# Patient Record
Sex: Female | Born: 1975 | Race: Black or African American | Hispanic: No | Marital: Single | Smoking: Never smoker
Health system: Southern US, Community
[De-identification: ages and names within clinical notes are randomized; demographics above are authoritative.]

## PROBLEM LIST (undated history)

## (undated) DIAGNOSIS — F419 Anxiety disorder, unspecified: Secondary | ICD-10-CM

## (undated) DIAGNOSIS — T7840XA Allergy, unspecified, initial encounter: Secondary | ICD-10-CM

## (undated) DIAGNOSIS — I10 Essential (primary) hypertension: Secondary | ICD-10-CM

## (undated) DIAGNOSIS — E785 Hyperlipidemia, unspecified: Secondary | ICD-10-CM

## (undated) HISTORY — DX: Anxiety disorder, unspecified: F41.9

## (undated) HISTORY — DX: Allergy, unspecified, initial encounter: T78.40XA

## (undated) HISTORY — DX: Essential (primary) hypertension: I10

## (undated) HISTORY — DX: Hyperlipidemia, unspecified: E78.5

---

## 2005-10-07 ENCOUNTER — Ambulatory Visit: Payer: Self-pay | Admitting: Family Medicine

## 2012-07-14 ENCOUNTER — Ambulatory Visit: Payer: Self-pay | Admitting: Family Medicine

## 2012-07-14 LAB — CREATININE, SERUM
EGFR (African American): >60
EGFR (Non-African Amer.): >60

## 2012-08-03 DIAGNOSIS — Z01810 Encounter for preprocedural cardiovascular examination: Secondary | ICD-10-CM | POA: Insufficient documentation

## 2012-10-27 HISTORY — PX: LAPAROSCOPIC GASTRIC SLEEVE RESECTION: SHX5895

## 2013-07-03 ENCOUNTER — Emergency Department: Payer: Self-pay | Admitting: Emergency Medicine

## 2013-07-03 LAB — CBC
HCT: 40.2 % (ref 35.0–47.0)
HGB: 13.9 g/dL (ref 12.0–16.0)
MCH: 25.3 pg — ABNORMAL LOW (ref 26.0–34.0)
MCHC: 34.5 g/dL (ref 32.0–36.0)
MCV: 73 fL — ABNORMAL LOW (ref 80–100)
Platelet: 279 10*3/uL (ref 150–440)
RBC: 5.47 10*6/uL — ABNORMAL HIGH (ref 3.80–5.20)
WBC: 8.6 10*3/uL (ref 3.6–11.0)

## 2013-07-03 LAB — COMPREHENSIVE METABOLIC PANEL
Albumin: 3.1 g/dL — ABNORMAL LOW (ref 3.4–5.0)
Anion Gap: 5 — ABNORMAL LOW (ref 7–16)
Bilirubin,Total: 0.4 mg/dL (ref 0.2–1.0)
Calcium, Total: 9 mg/dL (ref 8.5–10.1)
Glucose: 90 mg/dL (ref 65–99)
Osmolality: 272 (ref 275–301)
Potassium: 3.6 mmol/L (ref 3.5–5.1)
SGOT(AST): 15 U/L (ref 15–37)
SGPT (ALT): 16 U/L (ref 12–78)

## 2013-07-03 LAB — LIPASE, BLOOD: Lipase: 51 U/L — ABNORMAL LOW (ref 73–393)

## 2013-07-04 LAB — URINALYSIS, COMPLETE
Bacteria: NONE SEEN
Glucose,UR: NEGATIVE mg/dL (ref 0–75)
Leukocyte Esterase: NEGATIVE
Nitrite: NEGATIVE
Protein: NEGATIVE
RBC,UR: 11 /HPF (ref 0–5)
Specific Gravity: >1.06 (ref 1.003–1.030)
Squamous Epithelial: 6

## 2013-07-09 ENCOUNTER — Other Ambulatory Visit: Payer: Self-pay | Admitting: Gastroenterology

## 2013-07-09 LAB — HCG, QUANTITATIVE, PREGNANCY: Beta Hcg, Quant.: <1 m[IU]/mL — ABNORMAL LOW

## 2013-07-10 ENCOUNTER — Ambulatory Visit: Payer: Self-pay | Admitting: Gastroenterology

## 2013-08-01 ENCOUNTER — Ambulatory Visit: Payer: Self-pay | Admitting: Gastroenterology

## 2014-11-18 DIAGNOSIS — Z9884 Bariatric surgery status: Secondary | ICD-10-CM | POA: Insufficient documentation

## 2015-01-15 ENCOUNTER — Encounter (INDEPENDENT_AMBULATORY_CARE_PROVIDER_SITE_OTHER): Payer: Self-pay

## 2015-01-15 ENCOUNTER — Encounter: Payer: Self-pay | Admitting: Internal Medicine

## 2015-01-15 ENCOUNTER — Ambulatory Visit (INDEPENDENT_AMBULATORY_CARE_PROVIDER_SITE_OTHER): Payer: 59 | Admitting: Internal Medicine

## 2015-01-15 VITALS — BP 142/96 | HR 58 | Temp 98.3°F | Ht 65.0 in | Wt 276.0 lb

## 2015-01-15 DIAGNOSIS — I10 Essential (primary) hypertension: Secondary | ICD-10-CM | POA: Insufficient documentation

## 2015-01-15 NOTE — Progress Notes (Signed)
HPI  Pt presents to the clinic today to establish care and for management of the conditions listed below. She is transferring care from Dr. Dear.  HTN: She reports she has been out of her medications for the last 2 months. Her BP today is 142/96. She reports it normally runs 427-062 systolics. She denies headache, blurred vision, chest pain, or shortness of breath.  Obesity: She has had the gastric sleeve. She thinks she has gained 7 lbs in the last 3 months. Her BMI today is 45.93. She does walk 3-5 days per week. She can do 3 miles in 50 minutes. She is participating in a [place] in June.  Past Medical History  Diagnosis Date  . Hypertension     Current Outpatient Prescriptions  Medication Sig Dispense Refill  . Calcium-Vitamin D-Vitamin K (CALCIUM + D + K PO) Take 1 capsule by mouth daily.    . cyanocobalamin 2000 MCG tablet Take 2,000 mcg by mouth daily.    . hydrochlorothiazide (HYDRODIURIL) 25 MG tablet Take 25 mg by mouth daily.    . Multiple Vitamin (MULTIVITAMIN) tablet Take 1 tablet by mouth daily.    Marland Kitchen PREVIFEM 0.25-35 MG-MCG tablet Take 1 tablet by mouth daily.     No current facility-administered medications for this visit.    Allergies  Allergen Reactions  . Ranitidine Swelling    Face and lip swelling    Family History  Problem Relation Age of Onset  . Arthritis Maternal Grandmother   . Cancer Maternal Grandmother     breast  . Hypertension Maternal Grandmother     History   Social History  . Marital Status: Single    Spouse Name: N/A  . Number of Children: N/A  . Years of Education: N/A   Occupational History  . Not on file.   Social History Main Topics  . Smoking status: Never Smoker   . Smokeless tobacco: Never Used  . Alcohol Use: No  . Drug Use: No  . Sexual Activity: Not on file   Other Topics Concern  . Not on file   Social History Narrative  . No narrative on file    ROS:  Constitutional: Denies fever, malaise, fatigue, headache or  abrupt weight changes.  HEENT: Denies eye pain, eye redness, ear pain, ringing in the ears, wax buildup, runny nose, nasal congestion, bloody nose, or sore throat. Respiratory: Denies difficulty breathing, shortness of breath, cough or sputum production.   Cardiovascular: Denies chest pain, chest tightness, palpitations or swelling in the hands or feet.  Skin: Denies redness, rashes, lesions or ulcercations.  Neurological: Denies dizziness, difficulty with memory, difficulty with speech or problems with balance and coordination.  Psych: Denies anxiety, depression, SI/HI.  No other specific complaints in a complete review of systems (except as listed in HPI above).  PE:  BP 142/96 mmHg  Pulse 58  Temp(Src) 98.3 F (36.8 C) (Oral)  Ht 5\' 5"  (1.651 m)  Wt 276 lb (125.193 kg)  BMI 45.93 kg/m2  SpO2 98%  LMP 12/04/2014 Wt Readings from Last 3 Encounters:  01/15/15 276 lb (125.193 kg)    General: Appears her stated age, obese in NAD. HEENT: Head: normal shape and size; Eyes: sclera white, no icterus, conjunctiva pink, PERRLA and EOMs intact; Cardiovascular: Normal rate and rhythm. S1,S2 noted.  No murmur, rubs or gallops noted. No JVD or BLE edema. No carotid bruits noted. Pulmonary/Chest: Normal effort and positive vesicular breath sounds. No respiratory distress. No wheezes, rales or ronchi noted.  Neurological: Alert and oriented.   BMET    Component Value Date/Time   NA 137 07/03/2013 2132   K 3.6 07/03/2013 2132   CL 103 07/03/2013 2132   CO2 29 07/03/2013 2132   GLUCOSE 90 07/03/2013 2132   BUN 9 07/03/2013 2132   CREATININE 0.85 07/03/2013 2132   CALCIUM 9.0 07/03/2013 2132   GFRNONAA >60 07/03/2013 2132   GFRAA >60 07/03/2013 2132    Lipid Panel  No results found for: CHOL, TRIG, HDL, CHOLHDL, VLDL, LDLCALC  CBC    Component Value Date/Time   WBC 8.6 07/03/2013 2132   RBC 5.47* 07/03/2013 2132   HGB 13.9 07/03/2013 2132   HCT 40.2 07/03/2013 2132   PLT 279  07/03/2013 2132   MCV 73* 07/03/2013 2132   MCH 25.3* 07/03/2013 2132   MCHC 34.5 07/03/2013 2132   RDW 19.1* 07/03/2013 2132    Hgb A1C No results found for: HGBA1C   Assessment and Plan:

## 2015-01-15 NOTE — Assessment & Plan Note (Signed)
Encouraged her to try to pick up the pace of her walking with a goal of 3 miles in 40 minutes

## 2015-01-15 NOTE — Assessment & Plan Note (Signed)
She is not interested in restarting the medication at this time I will have her come back in 1 month to recheck BP If remains > 140/90, will restart HCTZ

## 2015-01-15 NOTE — Patient Instructions (Signed)

## 2015-01-15 NOTE — Progress Notes (Signed)
Pre visit review using our clinic review tool, if applicable. No additional management support is needed unless otherwise documented below in the visit note. 

## 2015-02-16 ENCOUNTER — Ambulatory Visit (INDEPENDENT_AMBULATORY_CARE_PROVIDER_SITE_OTHER): Payer: 59 | Admitting: Internal Medicine

## 2015-02-16 ENCOUNTER — Encounter: Payer: Self-pay | Admitting: Internal Medicine

## 2015-02-16 VITALS — BP 152/94 | HR 81 | Temp 98.3°F | Wt 280.0 lb

## 2015-02-16 DIAGNOSIS — I10 Essential (primary) hypertension: Secondary | ICD-10-CM | POA: Diagnosis not present

## 2015-02-16 MED ORDER — HYDROCHLOROTHIAZIDE 25 MG PO TABS: 25.0000 mg | ORAL_TABLET | Freq: Every day | ORAL | Status: DC

## 2015-02-16 NOTE — Patient Instructions (Signed)

## 2015-02-16 NOTE — Assessment & Plan Note (Signed)
BP remains elevated Will restart her HCTZ Will check CMET in 1 months Consume a DASH diet  RTC in 1 month to recheck blood pressure

## 2015-02-16 NOTE — Progress Notes (Signed)
Pre visit review using our clinic review tool, if applicable. No additional management support is needed unless otherwise documented below in the visit note. 

## 2015-02-16 NOTE — Progress Notes (Signed)
Subjective:    Patient ID: Margaret Carlson, female    DOB: [birth year], 39 y.o.   MRN: [id number]  HPI  Pt presents to the clinic today for 1 month follow up of HTN. At her last visit, her BP was 142/96. She has been on HCTZ in the past. She was not interested in restarting the HCTZ at that time so we decided to recheck it 1 month. Her BP today is 152/94. She denies headache, blurred vision, chest pain, or shortness of breath.  Review of Systems  Past Medical History  Diagnosis Date  . Hypertension     Current Outpatient Prescriptions  Medication Sig Dispense Refill  . Calcium-Vitamin D-Vitamin K (CALCIUM + D + K PO) Take 1 capsule by mouth daily.    . cyanocobalamin 2000 MCG tablet Take 2,000 mcg by mouth daily.    . hydrochlorothiazide (HYDRODIURIL) 25 MG tablet Take 25 mg by mouth daily.    . Multiple Vitamin (MULTIVITAMIN) tablet Take 1 tablet by mouth daily.    Marland Kitchen PREVIFEM 0.25-35 MG-MCG tablet Take 1 tablet by mouth daily.     No current facility-administered medications for this visit.    Allergies  Allergen Reactions  . Ranitidine Swelling    Face and lip swelling    Family History  Problem Relation Age of Onset  . Arthritis Maternal Grandmother   . Cancer Maternal Grandmother     breast  . Hypertension Maternal Grandmother     History   Social History  . Marital Status: Single    Spouse Name: N/A  . Number of Children: N/A  . Years of Education: N/A   Occupational History  . Not on file.   Social History Main Topics  . Smoking status: Never Smoker   . Smokeless tobacco: Never Used  . Alcohol Use: No  . Drug Use: No  . Sexual Activity: Yes   Other Topics Concern  . Not on file   Social History Narrative     Constitutional: Denies fever, malaise, fatigue, headache or abrupt weight changes.  Respiratory: Denies difficulty breathing, shortness of breath, cough or sputum production.   Cardiovascular: Denies chest pain, chest tightness,  palpitations or swelling in the hands or feet.  Neurological: Denies dizziness, difficulty with memory, difficulty with speech or problems with balance and coordination.   No other specific complaints in a complete review of systems (except as listed in HPI above).     Objective:   Physical Exam   BP 152/94 mmHg  Pulse 81  Temp(Src) 98.3 F (36.8 C) (Oral)  Wt 280 lb (127.007 kg)  SpO2 98% Wt Readings from Last 3 Encounters:  02/16/15 280 lb (127.007 kg)  01/15/15 276 lb (125.193 kg)    General: Appears her stated age, obese in NAD. Cardiovascular: Normal rate and rhythm. S1,S2 noted.  No murmur, rubs or gallops noted. . Pulmonary/Chest: Normal effort and positive vesicular breath sounds. No respiratory distress. No wheezes, rales or ronchi noted.  Neurological: Alert and oriented.   BMET    Component Value Date/Time   NA 137 07/03/2013 2132   K 3.6 07/03/2013 2132   CL 103 07/03/2013 2132   CO2 29 07/03/2013 2132   GLUCOSE 90 07/03/2013 2132   BUN 9 07/03/2013 2132   CREATININE 0.85 07/03/2013 2132   CALCIUM 9.0 07/03/2013 2132   GFRNONAA >60 07/03/2013 2132   GFRAA >60 07/03/2013 2132    Lipid Panel  No results found for: CHOL, TRIG, HDL, CHOLHDL, VLDL,  [place]  CBC    Component Value Date/Time   WBC 8.6 07/03/2013 2132   RBC 5.47* 07/03/2013 2132   HGB 13.9 07/03/2013 2132   HCT 40.2 07/03/2013 2132   PLT 279 07/03/2013 2132   MCV 73* 07/03/2013 2132   MCH 25.3* 07/03/2013 2132   MCHC 34.5 07/03/2013 2132   RDW 19.1* 07/03/2013 2132    Hgb A1C No results found for: HGBA1C      Assessment & Plan:

## 2015-03-11 ENCOUNTER — Other Ambulatory Visit: Payer: Self-pay | Admitting: Internal Medicine

## 2015-03-11 DIAGNOSIS — Z0000 Encounter for general adult medical examination without abnormal findings: Secondary | ICD-10-CM

## 2015-03-17 ENCOUNTER — Other Ambulatory Visit (INDEPENDENT_AMBULATORY_CARE_PROVIDER_SITE_OTHER): Payer: 59

## 2015-03-17 DIAGNOSIS — Z0000 Encounter for general adult medical examination without abnormal findings: Secondary | ICD-10-CM | POA: Diagnosis not present

## 2015-03-19 LAB — CBC WITH DIFFERENTIAL/PLATELET
BASOS: 0 %
Basophils Absolute: 0 10*3/uL (ref 0.0–0.2)
EOS (ABSOLUTE): 0.1 10*3/uL (ref 0.0–0.4)
EOS: 2 %
HEMATOCRIT: 36.7 % (ref 34.0–46.6)
Hemoglobin: 12.5 g/dL (ref 11.1–15.9)
Immature Grans (Abs): 0 10*3/uL (ref 0.0–0.1)
Immature Granulocytes: 0 %
LYMPHS: 51 %
Lymphocytes Absolute: 2.5 10*3/uL (ref 0.7–3.1)
MCH: 25.2 pg — ABNORMAL LOW (ref 26.6–33.0)
MCHC: 34.1 g/dL (ref 31.5–35.7)
MCV: 74 fL — ABNORMAL LOW (ref 79–97)
MONOCYTES: 7 %
MONOS ABS: 0.4 10*3/uL (ref 0.1–0.9)
NEUTROS ABS: 2 10*3/uL (ref 1.4–7.0)
NEUTROS PCT: 40 %
Platelets: 249 10*3/uL (ref 150–379)
RBC: 4.96 x10E6/uL (ref 3.77–5.28)
RDW: 17.1 % — AB (ref 12.3–15.4)
WBC: 4.9 10*3/uL (ref 3.4–10.8)

## 2015-03-19 LAB — COMPREHENSIVE METABOLIC PANEL
ALT: 9 IU/L (ref 0–32)
AST: 14 IU/L (ref 0–40)
Albumin/Globulin Ratio: 1.1 (ref 1.1–2.5)
Albumin: 3.5 g/dL (ref 3.5–5.5)
Alkaline Phosphatase: 50 IU/L (ref 39–117)
BUN/Creatinine Ratio: 13 (ref 8–20)
BUN: 11 mg/dL (ref 6–20)
CALCIUM: 8.3 mg/dL — AB (ref 8.7–10.2)
CO2: 26 mmol/L (ref 18–29)
Chloride: 97 mmol/L (ref 97–108)
Creatinine, Ser: 0.85 mg/dL (ref 0.57–1.00)
GFR calc Af Amer: 100 mL/min/{1.73_m2} (ref >59)
GFR, EST NON AFRICAN AMERICAN: 87 mL/min/{1.73_m2} (ref >59)
Globulin, Total: 3.3 g/dL (ref 1.5–4.5)
Glucose: 85 mg/dL (ref 65–99)
Potassium: 3.3 mmol/L — ABNORMAL LOW (ref 3.5–5.2)
Sodium: 139 mmol/L (ref 134–144)
TOTAL PROTEIN: 6.8 g/dL (ref 6.0–8.5)

## 2015-03-19 LAB — LIPID PANEL
Chol/HDL Ratio: 3.7 ratio units (ref 0.0–4.4)
Cholesterol, Total: 227 mg/dL — ABNORMAL HIGH (ref 100–199)
HDL: 62 mg/dL (ref >39)
LDL Calculated: 153 mg/dL — ABNORMAL HIGH (ref 0–99)
TRIGLYCERIDES: 62 mg/dL (ref 0–149)
VLDL CHOLESTEROL CAL: 12 mg/dL (ref 5–40)

## 2015-03-24 ENCOUNTER — Ambulatory Visit (INDEPENDENT_AMBULATORY_CARE_PROVIDER_SITE_OTHER): Payer: 59 | Admitting: Internal Medicine

## 2015-03-24 ENCOUNTER — Encounter: Payer: Self-pay | Admitting: Internal Medicine

## 2015-03-24 VITALS — BP 138/80 | HR 82 | Temp 98.6°F | Ht 65.0 in | Wt 283.0 lb

## 2015-03-24 DIAGNOSIS — Z0000 Encounter for general adult medical examination without abnormal findings: Secondary | ICD-10-CM

## 2015-03-24 DIAGNOSIS — I10 Essential (primary) hypertension: Secondary | ICD-10-CM | POA: Diagnosis not present

## 2015-03-24 DIAGNOSIS — Z23 Encounter for immunization: Secondary | ICD-10-CM

## 2015-03-24 DIAGNOSIS — E876 Hypokalemia: Secondary | ICD-10-CM

## 2015-03-24 MED ORDER — POTASSIUM CHLORIDE CRYS ER 10 MEQ PO TBCR: 10.0000 meq | EXTENDED_RELEASE_TABLET | Freq: Two times a day (BID) | ORAL | Status: DC

## 2015-03-24 MED ORDER — HYDROCHLOROTHIAZIDE 25 MG PO TABS: 25.0000 mg | ORAL_TABLET | Freq: Every day | ORAL | Status: DC

## 2015-03-24 NOTE — Progress Notes (Signed)
Pre visit review using our clinic review tool, if applicable. No additional management support is needed unless otherwise documented below in the visit note. 

## 2015-03-24 NOTE — Progress Notes (Signed)
Subjective:    Patient ID: Margaret Carlson, female    DOB: 09-Apr-1976, 39 y.o.   MRN: [id number]  HPI  Pt presents to the clinic today for her annual exam. She is also due to follow up HTN. At her last visit, her BP was elevated at 152/94. She was restarted on her HCTZ at that time.   Flu: 05/2014 Tetanus: > 10 years LMP: 03/21/15, She is sexually active Pap Smear: 09/2014- normal, she gets pap every 6 months due to history of abnormal paps M[facility]ogram: 2014 at [facility], has one scheduled for 03/26/15 Dentist: biannually  Diet: She drinks 32 oz per day. She does consume some coffee and sweet tea. Exercise: She does an elliptical 30-45 minutes, 3-4 days per week.   Review of Systems      Past Medical History  Diagnosis Date  . Hypertension     Current Outpatient Prescriptions  Medication Sig Dispense Refill  . Calcium-Vitamin D-Vitamin K (CALCIUM + D + K PO) Take 1 capsule by mouth daily.    . cyanocobalamin 2000 MCG tablet Take 2,000 mcg by mouth daily.    . hydrochlorothiazide (HYDRODIURIL) 25 MG tablet Take 1 tablet (25 mg total) by mouth daily. 30 tablet 1  . Multiple Vitamin (MULTIVITAMIN) tablet Take 1 tablet by mouth daily.    Marland Kitchen PREVIFEM 0.25-35 MG-MCG tablet Take 1 tablet by mouth daily.     No current facility-administered medications for this visit.    Allergies  Allergen Reactions  . Ranitidine Swelling    Face and lip swelling    Family History  Problem Relation Age of Onset  . Arthritis Maternal Grandmother   . Cancer Maternal Grandmother     breast  . Hypertension Maternal Grandmother     History   Social History  . Marital Status: Single    Spouse Name: N/A  . Number of Children: N/A  . Years of Education: N/A   Occupational History  . Not on file.   Social History Main Topics  . Smoking status: Never Smoker   . Smokeless tobacco: Never Used  . Alcohol Use: No  . Drug Use: No  . Sexual Activity: Yes   Other Topics Concern  . Not  on file   Social History Narrative     Constitutional: Denies fever, malaise, fatigue, headache or abrupt weight changes.  HEENT: Denies eye pain, eye redness, ear pain, ringing in the ears, wax buildup, runny nose, nasal congestion, bloody nose, or sore throat. Respiratory: Denies difficulty breathing, shortness of breath, cough or sputum production.   Cardiovascular: Denies chest pain, chest tightness, palpitations or swelling in the hands or feet.  Gastrointestinal: Denies abdominal pain, bloating, constipation, diarrhea or blood in the stool.  GU: Denies urgency, frequency, pain with urination, burning sensation, blood in urine, odor or discharge. Musculoskeletal: Denies decrease in range of motion, difficulty with gait, muscle pain or joint pain and swelling.  Skin: Denies redness, rashes, lesions or ulcercations.  Neurological: Denies dizziness, difficulty with memory, difficulty with speech or problems with balance and coordination.  Psych: Denies anxiety, depression, SI/HI.  No other specific complaints in a complete review of systems (except as listed in HPI above).  Objective:   Physical Exam   BP 138/80 mmHg  Pulse 82  Temp(Src) 98.6 F (37 C) (Oral)  Ht 5\' 5"  (1.651 m)  Wt 283 lb (128.368 kg)  BMI 47.09 kg/m2  SpO2 98%  LMP 03/21/2015 Wt Readings from Last 3 Encounters:  03/24/15 283 lb (128.368 kg)  02/16/15 280 lb (127.007 kg)  01/15/15 276 lb (125.193 kg)    General: Appears her stated age, obese in NAD. Skin: Warm, dry and intact. No rashes, lesions or ulcerations noted. HEENT: Head: normal shape and size; Eyes: sclera white, no icterus, conjunctiva pink, PERRLA and EOMs intact; Ears: Tm's gray and intact, normal light reflex; septum midline; Throat/Mouth: Teeth present, mucosa pink and moist, no exudate, lesions or ulcerations noted.  Neck: Neck supple, trachea midline. No masses, lumps or thyromegaly present.  Cardiovascular: Normal rate and rhythm. S1,S2  noted.  No murmur, rubs or gallops noted. No JVD or BLE edema. No carotid bruits noted. Pulmonary/Chest: Normal effort and positive vesicular breath sounds. No respiratory distress. No wheezes, rales or ronchi noted.  Abdomen: Soft and nontender. Normal bowel sounds, no bruits noted. No distention or masses noted. Liver, spleen and kidneys non palpable. Musculoskeletal: Normal range of motion. Strength 5/5 BUE/BLE. No difficulty with gait.  Neurological: Alert and oriented. Cranial nerves II-XII grossly ntact. Coordination normal.  Psychiatric: Mood and affect normal. Behavior is normal. Judgment and thought content normal.     BMET    Component Value Date/Time   NA 139 03/17/2015 1618   NA 137 07/03/2013 2132   K 3.3* 03/17/2015 1618   K 3.6 07/03/2013 2132   CL 97 03/17/2015 1618   CL 103 07/03/2013 2132   CO2 26 03/17/2015 1618   CO2 29 07/03/2013 2132   GLUCOSE 85 03/17/2015 1618   GLUCOSE 90 07/03/2013 2132   BUN 11 03/17/2015 1618   BUN 9 07/03/2013 2132   CREATININE 0.85 03/17/2015 1618   CREATININE 0.85 07/03/2013 2132   CALCIUM 8.3* 03/17/2015 1618   CALCIUM 9.0 07/03/2013 2132   GFRNONAA 87 03/17/2015 1618   GFRNONAA >60 07/03/2013 2132   GFRAA 100 03/17/2015 1618   GFRAA >60 07/03/2013 2132    Lipid Panel     Component Value Date/Time   CHOL 227* 03/17/2015 1618   TRIG 62 03/17/2015 1618   HDL 62 03/17/2015 1618   CHOLHDL 3.7 03/17/2015 1618   LDLCALC 153* 03/17/2015 1618    CBC    Component Value Date/Time   WBC 4.9 03/17/2015 1618   WBC 8.6 07/03/2013 2132   RBC 4.96 03/17/2015 1618   RBC 5.47* 07/03/2013 2132   HGB 13.9 07/03/2013 2132   HCT 36.7 03/17/2015 1618   HCT 40.2 07/03/2013 2132   PLT 279 07/03/2013 2132   MCV 73* 07/03/2013 2132   MCH 25.2* 03/17/2015 1618   MCH 25.3* 07/03/2013 2132   MCHC 34.1 03/17/2015 1618   MCHC 34.5 07/03/2013 2132   RDW 17.1* 03/17/2015 1618   RDW 19.1* 07/03/2013 2132   LYMPHSABS 2.5 03/17/2015 1618    BASOSABS 0.0 03/17/2015 1618    Hgb A1C No results found for: HGBA1C      Assessment & Plan:   Preventative Health Maintenance:  Flu shot UTD Tdap today She has a pap and mammogram scheduled this week Encouraged her to continue seeing a dentist yearly Encouraged her to work on diet and exercise Cholesterol slightly elevated, encouraged her to work on a low fat, low cholesterol diet

## 2015-03-24 NOTE — Patient Instructions (Signed)
Health Maintenance Adopting a healthy lifestyle and getting preventive care can go a [place]e way to promote health and wellness. Talk with your health care provider about what schedule of regular examinations is right for you. This is a good chance for you to check in with your provider about disease prevention and staying healthy. In between checkups, there are plenty of things you can do on your own. Experts have done a lot of research about which lifestyle changes and preventive measures are most likely to keep you healthy. Ask your health care provider for more information. WEIGHT AND DIET  Eat a healthy diet  Be sure to include plenty of vegetables, fruits, low-fat dairy products, and lean protein.  Do not eat a lot of foods high in solid fats, added sugars, or salt.  Get regular exercise. This is one of the most important things you can do for your health.  Most adults should exercise for at least 150 minutes each week. The exercise should increase your heart rate and make you sweat (moderate-intensity exercise).  Most adults should also do strengthening exercises at least twice a week. This is in addition to the moderate-intensity exercise.  Maintain a healthy weight  Body mass index (BMI) is a measurement that can be used to identify possible weight problems. It estimates body fat based on height and weight. Your health care provider can help determine your BMI and help you achieve or maintain a healthy weight.  For females 25 years of age and older:   A BMI below 18.5 is considered underweight.  A BMI of 18.5 to 24.9 is normal.  A BMI of 25 to 29.9 is considered overweight.  A BMI of 30 and above is considered obese.  Watch levels of cholesterol and blood lipids  You should start having your blood tested for lipids and cholesterol at 39 years of age, then have this test every 5 years.  You may need to have your cholesterol levels checked more often if:  Your lipid or  cholesterol levels are high.  You are older than 39 years of age.  You are at high risk for heart disease.  CANCER SCREENING   Lung Cancer  Lung cancer screening is recommended for adults 97-92 years old who are at high risk for lung cancer because of a history of smoking.  A yearly low-dose CT scan of the lungs is recommended for people who:  Currently smoke.  Have quit within the past 15 years.  Have at least a 30-pack-year history of smoking. A pack year is smoking an average of one pack of cigarettes a day for 1 year.  Yearly screening should continue until it has been 15 years since you quit.  Yearly screening should stop if you develop a health problem that would prevent you from having lung cancer treatment.  Breast Cancer  Practice breast self-awareness. This means understanding how your breasts normally appear and feel.  It also means doing regular breast self-exams. Let your health care provider know about any changes, no matter how small.  If you are in your 20s or 30s, you should have a clinical breast exam (CBE) by a health care provider every 1-3 years as part of a regular health exam.  If you are 76 or older, have a CBE every year. Also consider having a breast X-ray (mammogram) every year.  If you have a family history of breast cancer, talk to your health care provider about genetic screening.  If you are  at high risk for breast cancer, talk to your health care provider about having an MRI and a mammogram every year.  Breast cancer gene (BRCA) assessment is recommended for women who have family members with BRCA-related cancers. BRCA-related cancers include:  Breast.  Ovarian.  Tubal.  Peritoneal cancers.  Results of the assessment will determine the need for genetic counseling and BRCA1 and BRCA2 testing. Cervical Cancer Routine pelvic examinations to screen for cervical cancer are no longer recommended for nonpregnant women who are considered low  risk for cancer of the pelvic organs (ovaries, uterus, and vagina) and who do not have symptoms. A pelvic examination may be necessary if you have symptoms including those associated with pelvic infections. Ask your health care provider if a screening pelvic exam is right for you.   The Pap test is the screening test for cervical cancer for women who are considered at risk.  If you had a hysterectomy for a problem that was not cancer or a condition that could lead to cancer, then you no longer need Pap tests.  If you are older than 65 years, and you have had normal Pap tests for the past 10 years, you no longer need to have Pap tests.  If you have had past treatment for cervical cancer or a condition that could lead to cancer, you need Pap tests and screening for cancer for at least 20 years after your treatment.  If you no longer get a Pap test, assess your risk factors if they change (such as having a new sexual partner). This can affect whether you should start being screened again.  Some women have medical problems that increase their chance of getting cervical cancer. If this is the case for you, your health care provider may recommend more frequent screening and Pap tests.  The human papillomavirus (HPV) test is another test that may be used for cervical cancer screening. The HPV test looks for the virus that can cause cell changes in the cervix. The cells collected during the Pap test can be tested for HPV.  The HPV test can be used to screen women 30 years of age and older. Getting tested for HPV can extend the interval between normal Pap tests from three to five years.  An HPV test also should be used to screen women of any age who have unclear Pap test results.  After 39 years of age, women should have HPV testing as often as Pap tests.  Colorectal Cancer  This type of cancer can be detected and often prevented.  Routine colorectal cancer screening usually begins at 39 years of  age and continues through 39 years of age.  Your health care provider may recommend screening at an earlier age if you have risk factors for colon cancer.  Your health care provider may also recommend using home test kits to check for hidden blood in the stool.  A small camera at the end of a tube can be used to examine your colon directly (sigmoidoscopy or colonoscopy). This is done to check for the earliest forms of colorectal cancer.  Routine screening usually begins at age 50.  Direct examination of the colon should be repeated every 5-10 years through 39 years of age. However, you may need to be screened more often if early forms of precancerous polyps or small growths are found. Skin Cancer  Check your skin from head to toe regularly.  Tell your health care provider about any new moles or changes in   moles, especially if there is a change in a mole's shape or color.  Also tell your health care provider if you have a mole that is larger than the size of a pencil eraser.  Always use sunscreen. Apply sunscreen liberally and repeatedly throughout the day.  Protect yourself by wearing [place] sleeves, pants, a wide-brimmed hat, and sunglasses whenever you are outside. HEART DISEASE, DIABETES, AND HIGH BLOOD PRESSURE   Have your blood pressure checked at least every 1-2 years. High blood pressure causes heart disease and increases the risk of stroke.  If you are between 75 years and 42 years old, ask your health care provider if you should take aspirin to prevent strokes.  Have regular diabetes screenings. This involves taking a blood sample to check your fasting blood sugar level.  If you are at a normal weight and have a low risk for diabetes, have this test once every three years after 39 years of age.  If you are overweight and have a high risk for diabetes, consider being tested at a younger age or more often. PREVENTING INFECTION  Hepatitis B  If you have a higher risk for  hepatitis B, you should be screened for this virus. You are considered at high risk for hepatitis B if:  You were born in a country where hepatitis B is common. Ask your health care provider which countries are considered high risk.  Your parents were born in a high-risk country, and you have not been immunized against hepatitis B (hepatitis B vaccine).  You have HIV or AIDS.  You use needles to inject street drugs.  You live with someone who has hepatitis B.  You have had sex with someone who has hepatitis B.  You get hemodialysis treatment.  You take certain medicines for conditions, including cancer, organ transplantation, and autoimmune conditions. Hepatitis C  Blood testing is recommended for:  Everyone born from [birth year] through 1965.  Anyone with known risk factors for hepatitis C. Sexually transmitted infections (STIs)  You should be screened for sexually transmitted infections (STIs) including gonorrhea and chlamydia if:  You are sexually active and are younger than 39 years of age.  You are older than 39 years of age and your health care provider tells you that you are at risk for this type of infection.  Your sexual activity has changed since you were last screened and you are at an increased risk for chlamydia or gonorrhea. Ask your health care provider if you are at risk.  If you do not have HIV, but are at risk, it may be recommended that you take a prescription medicine daily to prevent HIV infection. This is called pre-exposure prophylaxis (PrEP). You are considered at risk if:  You are sexually active and do not regularly use condoms or know the HIV status of your partner(s).  You take drugs by injection.  You are sexually active with a partner who has HIV. Talk with your health care provider about whether you are at high risk of being infected with HIV. If you choose to begin PrEP, you should first be tested for HIV. You should then be tested every 3 months for  as [place] as you are taking PrEP.  PREGNANCY   If you are premenopausal and you may become pregnant, ask your health care provider about preconception counseling.  If you may become pregnant, take 400 to 800 micrograms (mcg) of folic acid every day.  If you want to prevent pregnancy, talk to your  health care provider about birth control (contraception). OSTEOPOROSIS AND MENOPAUSE   Osteoporosis is a disease in which the bones lose minerals and strength with aging. This can result in serious bone fractures. Your risk for osteoporosis can be identified using a bone density scan.  If you are 43 years of age or older, or if you are at risk for osteoporosis and fractures, ask your health care provider if you should be screened.  Ask your health care provider whether you should take a calcium or vitamin D supplement to lower your risk for osteoporosis.  Menopause may have certain physical symptoms and risks.  Hormone replacement therapy may reduce some of these symptoms and risks. Talk to your health care provider about whether hormone replacement therapy is right for you.  HOME CARE INSTRUCTIONS   Schedule regular health, dental, and eye exams.  Stay current with your immunizations.   Do not use any tobacco products including cigarettes, chewing tobacco, or electronic cigarettes.  If you are pregnant, do not drink alcohol.  If you are breastfeeding, limit how much and how often you drink alcohol.  Limit alcohol intake to no more than 1 drink per day for nonpregnant women. One drink equals 12 ounces of beer, 5 ounces of wine, or 1 ounces of hard liquor.  Do not use street drugs.  Do not share needles.  Ask your health care provider for help if you need support or information about quitting drugs.  Tell your health care provider if you often feel depressed.  Tell your health care provider if you have ever been abused or do not feel safe at home. Document Released: 02/28/2011  Document Revised: 12/30/2013 Document Reviewed: 07/17/2013 [facility] Patient Information 2015 [place], [place]. This information is not intended to replace advice given to you by your health care provider. Make sure you discuss any questions you have with your health care provider.

## 2015-03-24 NOTE — Assessment & Plan Note (Addendum)
BP today 138/80, much better control on HCTZ Potassium is low, will start potassium supplement, eRx sent to pharmacy ECG today normal  RTC in 1 month (lab only) to recheck potassium

## 2015-03-26 NOTE — Addendum Note (Signed)
Addended by: Lindalou Hose Y on: 03/26/2015 10:30 AM   Modules accepted: Orders

## 2015-04-06 ENCOUNTER — Other Ambulatory Visit: Payer: Self-pay | Admitting: Unknown Physician Specialty

## 2015-04-06 DIAGNOSIS — R928 Other abnormal and inconclusive findings on diagnostic imaging of breast: Secondary | ICD-10-CM

## 2015-04-08 ENCOUNTER — Ambulatory Visit
Admission: RE | Admit: 2015-04-08 | Discharge: 2015-04-08 | Disposition: A | Discharge status: Home / Self Care | Payer: 59 | Source: Ambulatory Visit | Attending: Unknown Physician Specialty | Admitting: Unknown Physician Specialty

## 2015-04-08 DIAGNOSIS — R928 Other abnormal and inconclusive findings on diagnostic imaging of breast: Secondary | ICD-10-CM | POA: Insufficient documentation

## 2015-05-12 ENCOUNTER — Other Ambulatory Visit: Payer: Self-pay | Admitting: Internal Medicine

## 2015-05-12 NOTE — Telephone Encounter (Signed)
She was supposed to come in to recheck her potassium level and she never did. Have her make lab appt to recheck potassium. Will she run out of med before we can get the lab checked?

## 2015-05-12 NOTE — Telephone Encounter (Signed)
Received an electronic refill request for Potassium Chloride. Last refilled 03/24/15 for #180 with 0 refills. Ok to refill?

## 2015-05-13 NOTE — Telephone Encounter (Signed)
Left message on voicemail.

## 2015-05-15 ENCOUNTER — Other Ambulatory Visit: Payer: 59

## 2015-05-21 NOTE — Telephone Encounter (Signed)
Pt has appt tomorrow will address then.

## 2015-05-21 NOTE — Telephone Encounter (Signed)
Left message on voicemail.

## 2015-05-22 ENCOUNTER — Ambulatory Visit (INDEPENDENT_AMBULATORY_CARE_PROVIDER_SITE_OTHER): Payer: 59 | Admitting: Internal Medicine

## 2015-05-22 ENCOUNTER — Other Ambulatory Visit (INDEPENDENT_AMBULATORY_CARE_PROVIDER_SITE_OTHER): Payer: 59

## 2015-05-22 ENCOUNTER — Encounter: Payer: Self-pay | Admitting: Internal Medicine

## 2015-05-22 VITALS — BP 136/88 | HR 64 | Temp 97.7°F | Wt 282.2 lb

## 2015-05-22 DIAGNOSIS — Z0289 Encounter for other administrative examinations: Secondary | ICD-10-CM

## 2015-05-22 DIAGNOSIS — T502X5A Adverse effect of carbonic-anhydrase inhibitors, benzothiadiazides and other diuretics, initial encounter: Secondary | ICD-10-CM | POA: Diagnosis not present

## 2015-05-22 DIAGNOSIS — E876 Hypokalemia: Secondary | ICD-10-CM | POA: Diagnosis not present

## 2015-05-22 DIAGNOSIS — I10 Essential (primary) hypertension: Secondary | ICD-10-CM | POA: Diagnosis not present

## 2015-05-22 NOTE — Progress Notes (Signed)
Subjective:    Patient ID: Margaret Carlson, female    DOB: August 31, 1975, 39 y.o.   MRN: [id number]  HPI  Pt presents to the clinic today to have a form filled out. It is an insurance form that request exception to their BMI goal. She reports she is unable to achieve the BMI goal of< 30, but she has a plan to start achieving weight loss. She plans to avoid eating out, consuming less carbs. She plans to drink mostly water instead of juices and soda. She is going to join a gym a start with light cardio at least 3 days per week. Her goal is to lose 10 lbs over the next month.  She is also due to have her potassium level checked. At her last visit, her potassium level was 3.3. This is diuretic induced secondary to being on HCTZ. She was started on a potassium supplement but never came back to have her potassium level checked. She would like to get the lab done today. She denies muscle cramps. Her BP today is 136/98.  Review of Systems      Past Medical History  Diagnosis Date  . Hypertension     Current Outpatient Prescriptions  Medication Sig Dispense Refill  . acyclovir (ZOVIRAX) 400 MG tablet Take 400 mg by mouth daily as needed.    . Calcium-Vitamin D-Vitamin K (CALCIUM + D + K PO) Take 1 capsule by mouth daily.    . cyanocobalamin 2000 MCG tablet Take 2,000 mcg by mouth daily.    . hydrochlorothiazide (HYDRODIURIL) 25 MG tablet Take 1 tablet (25 mg total) by mouth daily. 90 tablet 1  . Multiple Vitamin (MULTIVITAMIN) tablet Take 1 tablet by mouth daily.    . potassium chloride (K-DUR,KLOR-CON) 10 MEQ tablet Take 1 tablet (10 mEq total) by mouth 2 (two) times daily. 180 tablet 0  . PREVIFEM 0.25-35 MG-MCG tablet Take 1 tablet by mouth daily.     No current facility-administered medications for this visit.    Allergies  Allergen Reactions  . Ranitidine Swelling    Face and lip swelling    Family History  Problem Relation Age of Onset  . Arthritis Maternal Grandmother   . Cancer  Maternal Grandmother     breast  . Hypertension Maternal Grandmother     Social History   Social History  . Marital Status: Single    Spouse Name: N/A  . Number of Children: N/A  . Years of Education: N/A   Occupational History  . Not on file.   Social History Main Topics  . Smoking status: Never Smoker   . Smokeless tobacco: Never Used  . Alcohol Use: No  . Drug Use: No  . Sexual Activity: Yes   Other Topics Concern  . Not on file   Social History Narrative     Constitutional: Denies fever, malaise, fatigue, headache or abrupt weight changes.  Respiratory: Denies difficulty breathing, shortness of breath, cough or sputum production.   Cardiovascular: Denies chest pain, chest tightness, palpitations or swelling in the hands or feet.  Musculoskeletal: Denies decrease in range of motion, difficulty with gait, muscle pain or joint pain and swelling.  Skin: Denies redness, rashes, lesions or ulcercations.  Neurological: Denies dizziness, difficulty with memory, difficulty with speech or problems with balance and coordination.    No other specific complaints in a complete review of systems (except as listed in HPI above).  Objective:   Physical Exam  BP 136/88 mmHg  Pulse  64  Temp(Src) 97.7 F (36.5 C) (Oral)  Wt 282 lb 4 oz (128.028 kg)  SpO2 98%  LMP 05/18/2015 Wt Readings from Last 3 Encounters:  05/22/15 282 lb 4 oz (128.028 kg)  03/24/15 283 lb (128.368 kg)  02/16/15 280 lb (127.007 kg)    General: Appears her stated age, obese in NAD. Cardiovascular: Normal rate and rhythm. S1,S2 noted.  No murmur, rubs or gallops noted. No BLE edema. Pulmonary/Chest: Normal effort and positive vesicular breath sounds. No respiratory distress. No wheezes, rales or ronchi noted.  Neurological: Alert and oriented.    BMET    Component Value Date/Time   NA 139 03/17/2015 1618   NA 137 07/03/2013 2132   K 3.3* 03/17/2015 1618   K 3.6 07/03/2013 2132   CL 97 03/17/2015  1618   CL 103 07/03/2013 2132   CO2 26 03/17/2015 1618   CO2 29 07/03/2013 2132   GLUCOSE 85 03/17/2015 1618   GLUCOSE 90 07/03/2013 2132   BUN 11 03/17/2015 1618   BUN 9 07/03/2013 2132   CREATININE 0.85 03/17/2015 1618   CREATININE 0.85 07/03/2013 2132   CALCIUM 8.3* 03/17/2015 1618   CALCIUM 9.0 07/03/2013 2132   GFRNONAA 87 03/17/2015 1618   GFRNONAA >60 07/03/2013 2132   GFRAA 100 03/17/2015 1618   GFRAA >60 07/03/2013 2132    Lipid Panel     Component Value Date/Time   CHOL 227* 03/17/2015 1618   TRIG 62 03/17/2015 1618   HDL 62 03/17/2015 1618   CHOLHDL 3.7 03/17/2015 1618   LDLCALC 153* 03/17/2015 1618    CBC    Component Value Date/Time   WBC 4.9 03/17/2015 1618   WBC 8.6 07/03/2013 2132   RBC 4.96 03/17/2015 1618   RBC 5.47* 07/03/2013 2132   HGB 13.9 07/03/2013 2132   HCT 36.7 03/17/2015 1618   HCT 40.2 07/03/2013 2132   PLT 279 07/03/2013 2132   MCV 73* 07/03/2013 2132   MCH 25.2* 03/17/2015 1618   MCH 25.3* 07/03/2013 2132   MCHC 34.1 03/17/2015 1618   MCHC 34.5 07/03/2013 2132   RDW 17.1* 03/17/2015 1618   RDW 19.1* 07/03/2013 2132   LYMPHSABS 2.5 03/17/2015 1618   BASOSABS 0.0 03/17/2015 1618    Hgb A1C No results found for: HGBA1C       Assessment & Plan:   HTN:  Well controlled on HCTZ ECG from 02/2015 reviewed CMET from 02/2015 reviewed No medication changes needed  Diuretic induced hypokalemia:  Will recheck potassium level today Will adjust potassium dose if needed based on labs  Encounter for form completion with pt:  Form filled out per request  Obesity:  Advised her to start a 1500 calorie/day diet Get at least 6- 8oz glasses of water daily Start walking for at least 30 minutes 3 x a week  RTC in 6 months for your annual exam/followup

## 2015-05-22 NOTE — Progress Notes (Signed)
Pre visit review using our clinic review tool, if applicable. No additional management support is needed unless otherwise documented below in the visit note. 

## 2015-05-22 NOTE — Patient Instructions (Signed)

## 2015-05-23 LAB — POTASSIUM: POTASSIUM: 3.8 mmol/L (ref 3.5–5.2)

## 2015-05-25 ENCOUNTER — Encounter: Payer: Self-pay | Admitting: *Deleted

## 2015-05-26 ENCOUNTER — Ambulatory Visit
Admission: RE | Admit: 2015-05-26 | Discharge: 2015-05-26 | Disposition: A | Discharge status: Home / Self Care | Payer: 59 | Source: Ambulatory Visit | Attending: Internal Medicine | Admitting: Internal Medicine

## 2015-05-26 ENCOUNTER — Ambulatory Visit (INDEPENDENT_AMBULATORY_CARE_PROVIDER_SITE_OTHER): Payer: 59 | Admitting: Internal Medicine

## 2015-05-26 ENCOUNTER — Encounter (INDEPENDENT_AMBULATORY_CARE_PROVIDER_SITE_OTHER): Payer: Self-pay

## 2015-05-26 ENCOUNTER — Encounter: Payer: Self-pay | Admitting: Internal Medicine

## 2015-05-26 VITALS — BP 138/92 | HR 67 | Temp 98.2°F | Wt 288.5 lb

## 2015-05-26 DIAGNOSIS — K5900 Constipation, unspecified: Secondary | ICD-10-CM | POA: Diagnosis not present

## 2015-05-26 DIAGNOSIS — R3129 Other microscopic hematuria: Secondary | ICD-10-CM

## 2015-05-26 DIAGNOSIS — M545 Low back pain, unspecified: Secondary | ICD-10-CM

## 2015-05-26 DIAGNOSIS — R312 Other microscopic hematuria: Secondary | ICD-10-CM | POA: Diagnosis not present

## 2015-05-26 LAB — POCT URINALYSIS DIPSTICK
Bilirubin, UA: NEGATIVE
GLUCOSE UA: NEGATIVE
KETONES UA: NEGATIVE
Leukocytes, UA: NEGATIVE
Nitrite, UA: NEGATIVE
UROBILINOGEN UA: NEGATIVE
pH, UA: 5.5

## 2015-05-26 NOTE — Addendum Note (Signed)
Addended by: Lurlean Nanny on: 05/26/2015 03:04 PM   Modules accepted: Orders

## 2015-05-26 NOTE — Progress Notes (Signed)
Pre visit review using our clinic review tool, if applicable. No additional management support is needed unless otherwise documented below in the visit note. 

## 2015-05-26 NOTE — Progress Notes (Signed)
HPI  Pt presents to the clinic today with c/o low back pain. This started 2 weeks ago. She describes the pain as crampy and aching. The pain does not radiated. It does seem worse with movement. She denies urgency, frequency, nausea, vomiting or change in her bowels. She has tried increasing fluids and taking cranberry juice. She has taken Tylenol with minimal relief. She has no history of kidney stones. She denies any injury to the back that she is aware of.   Review of Systems  Past Medical History  Diagnosis Date  . Hypertension     Family History  Problem Relation Age of Onset  . Arthritis Maternal Grandmother   . Cancer Maternal Grandmother     breast  . Hypertension Maternal Grandmother     Social History   Social History  . Marital Status: Single    Spouse Name: N/A  . Number of Children: N/A  . Years of Education: N/A   Occupational History  . Not on file.   Social History Main Topics  . Smoking status: Never Smoker   . Smokeless tobacco: Never Used  . Alcohol Use: No  . Drug Use: No  . Sexual Activity: Yes   Other Topics Concern  . Not on file   Social History Narrative    Allergies  Allergen Reactions  . Ranitidine Swelling    Face and lip swelling    Constitutional: Denies fever, malaise, fatigue, headache or abrupt weight changes.   GI: Denies constipation, diarrhea, or blood in her stool. GU: Denies urgency, frequency,  burning sensation, blood in urine, odor or discharge. MSK: Pt reports low back pain. Denies joint pain and swelling. Skin: Denies redness, rashes, lesions or ulcercations.   No other specific complaints in a complete review of systems (except as listed in HPI above).    Objective:   Physical Exam  BP 138/92 mmHg  Pulse 67  Temp(Src) 98.2 F (36.8 C) (Oral)  Wt 288 lb 8 oz (130.863 kg)  SpO2 98%  LMP 05/18/2015  Wt Readings from Last 3 Encounters:  05/26/15 288 lb 8 oz (130.863 kg)  05/22/15 282 lb 4 oz (128.028 kg)   03/24/15 283 lb (128.368 kg)    General: Appears her stated age, obese, in NAD. Cardiovascular: Normal rate and rhythm. S1,S2 noted.   Pulmonary/Chest: Normal effort and positive vesicular breath sounds. No respiratory distress. No wheezes, rales or ronchi noted.  Abdomen: Soft and nontender. Normal bowel sounds. No distention or masses noted. No CVA tenderness.  MSK: No pain with palpation over the lumbar spine. Pain with palpation of the paralumbar muscles. Decreased flexion, extension and rotation secondary to pain.     Assessment & Plan:   Low back:  Seems MSK but concerned about amount of blood in her urine Urinalysis: large blood (she is not on or due for her menstrual) CMET and KUB today Push fluids She declines RX for Tramadol, continue Tylenol A heating pad may be helpful To ER if symptoms worsen  RTC as needed or if symptoms persist.

## 2015-05-26 NOTE — Patient Instructions (Signed)
Back Pain, Adult Low back pain is very common. About 1 in 5 people have back pain.The cause of low back pain is rarely dangerous. The pain often gets better over time.About half of people with a sudden onset of back pain feel better in just 2 weeks. About 8 in 10 people feel better by 6 weeks.  CAUSES Some common causes of back pain include:  Strain of the muscles or ligaments supporting the spine.  Wear and tear (degeneration) of the spinal discs.  Arthritis.  Direct injury to the back. DIAGNOSIS Most of the time, the direct cause of low back pain is not known.However, back pain can be treated effectively even when the exact cause of the pain is unknown.Answering your caregiver's questions about your overall health and symptoms is one of the most accurate ways to make sure the cause of your pain is not dangerous. If your caregiver needs more information, he or she may order lab work or imaging tests (X-rays or MRIs).However, even if imaging tests show changes in your back, this usually does not require surgery. HOME CARE INSTRUCTIONS For many people, back pain returns.Since low back pain is rarely dangerous, it is often a condition that people can learn to manageon their own.   Remain active. It is stressful on the back to sit or stand in one place. Do not sit, drive, or stand in one place for more than 30 minutes at a time. Take short walks on level surfaces as soon as pain allows.Try to increase the length of time you walk each day.  Do not stay in bed.Resting more than 1 or 2 days can delay your recovery.  Do not avoid exercise or work.Your body is made to move.It is not dangerous to be active, even though your back may hurt.Your back will likely heal faster if you return to being active before your pain is gone.  Pay attention to your body when you bend and lift. Many people have less discomfortwhen lifting if they bend their knees, keep the load close to their bodies,and  avoid twisting. Often, the most comfortable positions are those that put less stress on your recovering back.  Find a comfortable position to sleep. Use a firm mattress and lie on your side with your knees slightly bent. If you lie on your back, put a pillow under your knees.  Only take over-the-counter or prescription medicines as directed by your caregiver. Over-the-counter medicines to reduce pain and inflammation are often the most helpful.Your caregiver may prescribe muscle relaxant drugs.These medicines help dull your pain so you can more quickly return to your normal activities and healthy exercise.  Put ice on the injured area.  Put ice in a plastic bag.  Place a towel between your skin and the bag.  Leave the ice on for 15-20 minutes, 03-04 times a day for the first 2 to 3 days. After that, ice and heat may be alternated to reduce pain and spasms.  Ask your caregiver about trying back exercises and gentle massage. This may be of some benefit.  Avoid feeling anxious or stressed.Stress increases muscle tension and can worsen back pain.It is important to recognize when you are anxious or stressed and learn ways to manage it.Exercise is a great option. SEEK MEDICAL CARE IF:  You have pain that is not relieved with rest or medicine.  You have pain that does not improve in 1 week.  You have new symptoms.  You are generally not feeling well. SEEK   IMMEDIATE MEDICAL CARE IF:   You have pain that radiates from your back into your legs.  You develop new bowel or bladder control problems.  You have unusual weakness or numbness in your arms or legs.  You develop nausea or vomiting.  You develop abdominal pain.  You feel faint. Document Released: 08/15/2005 Document Revised: 02/14/2012 Document Reviewed: 12/17/2013 ExitCare Patient Information 2015 [employer]. This information is not intended to replace advice given to you by your health care provider. Make sure you  discuss any questions you have with your health care provider.  

## 2015-05-26 NOTE — Addendum Note (Signed)
Addended by: Marchia Bond on: 05/26/2015 01:39 PM   Modules accepted: Orders

## 2015-05-27 ENCOUNTER — Other Ambulatory Visit: Payer: Self-pay | Admitting: Internal Medicine

## 2015-05-27 LAB — COMPREHENSIVE METABOLIC PANEL
ALBUMIN: 3.4 g/dL — AB (ref 3.5–5.5)
ALT: 9 IU/L (ref 0–32)
AST: 12 IU/L (ref 0–40)
Albumin/Globulin Ratio: 1 — ABNORMAL LOW (ref 1.1–2.5)
Alkaline Phosphatase: 52 IU/L (ref 39–117)
BILIRUBIN TOTAL: 0.2 mg/dL (ref 0.0–1.2)
BUN/Creatinine Ratio: 19 (ref 8–20)
BUN: 13 mg/dL (ref 6–20)
CALCIUM: 8.4 mg/dL — AB (ref 8.7–10.2)
CHLORIDE: 101 mmol/L (ref 97–108)
CO2: 26 mmol/L (ref 18–29)
CREATININE: 0.69 mg/dL (ref 0.57–1.00)
GFR calc non Af Amer: 110 mL/min/{1.73_m2} (ref >59)
GFR, EST AFRICAN AMERICAN: 127 mL/min/{1.73_m2} (ref >59)
GLUCOSE: 77 mg/dL (ref 65–99)
Globulin, Total: 3.4 g/dL (ref 1.5–4.5)
Potassium: 4 mmol/L (ref 3.5–5.2)
Sodium: 138 mmol/L (ref 134–144)
TOTAL PROTEIN: 6.8 g/dL (ref 6.0–8.5)

## 2015-05-27 MED ORDER — METHOCARBAMOL 500 MG PO TABS
500.0000 mg | ORAL_TABLET | Freq: Three times a day (TID) | ORAL | Status: DC | PRN
Start: 2015-05-27 — End: 2016-06-08

## 2015-09-23 ENCOUNTER — Other Ambulatory Visit: Payer: Self-pay | Admitting: Unknown Physician Specialty

## 2015-09-23 DIAGNOSIS — R928 Other abnormal and inconclusive findings on diagnostic imaging of breast: Secondary | ICD-10-CM

## 2015-10-02 ENCOUNTER — Telehealth: Payer: Self-pay

## 2015-10-02 NOTE — Telephone Encounter (Signed)
Left voicemail regarding flu shot clinic.

## 2015-10-12 ENCOUNTER — Ambulatory Visit
Admission: RE | Admit: 2015-10-12 | Discharge: 2015-10-12 | Disposition: A | Discharge status: Home / Self Care | Payer: 59 | Source: Ambulatory Visit | Attending: Unknown Physician Specialty | Admitting: Unknown Physician Specialty

## 2015-10-12 DIAGNOSIS — R928 Other abnormal and inconclusive findings on diagnostic imaging of breast: Secondary | ICD-10-CM

## 2015-10-23 ENCOUNTER — Encounter: Payer: Self-pay | Admitting: Internal Medicine

## 2015-10-23 ENCOUNTER — Ambulatory Visit (INDEPENDENT_AMBULATORY_CARE_PROVIDER_SITE_OTHER): Payer: 59 | Admitting: Internal Medicine

## 2015-10-23 VITALS — BP 136/84 | HR 68 | Temp 98.7°F | Wt 293.5 lb

## 2015-10-23 DIAGNOSIS — R05 Cough: Secondary | ICD-10-CM

## 2015-10-23 DIAGNOSIS — R52 Pain, unspecified: Secondary | ICD-10-CM | POA: Diagnosis not present

## 2015-10-23 DIAGNOSIS — R059 Cough, unspecified: Secondary | ICD-10-CM

## 2015-10-23 MED ORDER — HYDROCODONE-HOMATROPINE 5-1.5 MG/5ML PO SYRP: 5.0000 mL | ORAL_SOLUTION | Freq: Three times a day (TID) | ORAL | Status: DC | PRN

## 2015-10-23 NOTE — Progress Notes (Signed)
Pre visit review using our clinic review tool, if applicable. No additional management support is needed unless otherwise documented below in the visit note. 

## 2015-10-23 NOTE — Progress Notes (Signed)
HPI  Pt presents to the clinic today with c/o cough and body aches. This started yesterday. The cough is non productive. She denies runny nose, nasal congestion or sore throat. She denies fever. She has tried Copywriter, advertising with minimal relief. She has has no history of allergies or breathing problems. She has had sick contacts. She did get her flu shot.  Review of Systems      Past Medical History  Diagnosis Date  . Hypertension     Family History  Problem Relation Age of Onset  . Arthritis Maternal Grandmother   . Cancer Maternal Grandmother     breast  . Hypertension Maternal Grandmother   . Breast cancer Maternal Grandmother     Social History   Social History  . Marital Status: Single    Spouse Name: N/A  . Number of Children: N/A  . Years of Education: N/A   Occupational History  . Not on file.   Social History Main Topics  . Smoking status: Never Smoker   . Smokeless tobacco: Never Used  . Alcohol Use: No  . Drug Use: No  . Sexual Activity: Yes   Other Topics Concern  . Not on file   Social History Narrative    Allergies  Allergen Reactions  . Ranitidine Swelling    Face and lip swelling     Constitutional: Denies headache, fatigue, fever or abrupt weight changes.  HEENT:  Denies eye redness, eye pain, pressure behind the eyes, facial pain, nasal congestion, ear pain, ringing in the ears, wax buildup, runny nose or sore throat. Respiratory: Positive cough. Denies difficulty breathing or shortness of breath.  Cardiovascular: Denies chest pain, chest tightness, palpitations or swelling in the hands or feet.   No other specific complaints in a complete review of systems (except as listed in HPI above).  Objective:   BP 136/84 mmHg  Pulse 68  Temp(Src) 98.7 F (37.1 C) (Oral)  Wt 293 lb 8 oz (133.131 kg)  SpO2 98%  LMP 10/05/2015 (Within Days)  Wt Readings from Last 3 Encounters:  10/23/15 293 lb 8 oz (133.131 kg)  05/26/15 288 lb 8 oz (130.863  kg)  05/22/15 282 lb 4 oz (128.028 kg)     General: Appears her stated age,in NAD. HEENT: Head: normal shape and size, no sinus tenderness noted; Eyes: sclera white, no icterus, conjunctiva pink; Ears: Tm's gray and intact, normal light reflex;  Throat/Mouth: Teeth present, mucosa pink and moist, + PND, no exudate noted, no lesions or ulcerations noted.  Neck: No cervical lymphadenopathy.  Cardiovascular: Normal rate and rhythm. S1,S2 noted.  No murmur, rubs or gallops noted.  Pulmonary/Chest: Normal effort and diminished vesicular breath sounds. No respiratory distress. No wheezes, rales or ronchi noted.      Assessment & Plan:   Cough and body aches:  Rapid flu: negative Get some rest and drink plenty of water Mucinex 600 mg BID Rx for Hycodan cough syrup  RTC as needed or if symptoms persist.

## 2015-10-23 NOTE — Patient Instructions (Signed)

## 2015-11-08 ENCOUNTER — Other Ambulatory Visit: Payer: Self-pay | Admitting: Internal Medicine

## 2015-12-07 ENCOUNTER — Ambulatory Visit: Payer: 59 | Admitting: Internal Medicine

## 2016-04-08 ENCOUNTER — Ambulatory Visit (INDEPENDENT_AMBULATORY_CARE_PROVIDER_SITE_OTHER): Payer: 59 | Admitting: Internal Medicine

## 2016-04-08 ENCOUNTER — Encounter: Payer: Self-pay | Admitting: Internal Medicine

## 2016-04-08 VITALS — BP 138/96 | HR 94 | Temp 99.5°F | Wt 292.0 lb

## 2016-04-08 DIAGNOSIS — J069 Acute upper respiratory infection, unspecified: Secondary | ICD-10-CM

## 2016-04-08 MED ORDER — HYDROCODONE-HOMATROPINE 5-1.5 MG/5ML PO SYRP: 5.0000 mL | ORAL_SOLUTION | Freq: Three times a day (TID) | ORAL | 0 refills | Status: DC | PRN

## 2016-04-08 NOTE — Patient Instructions (Signed)
Upper Respiratory Infection, Adult Most upper respiratory infections (URIs) are a viral infection of the air passages leading to the lungs. A URI affects the nose, throat, and upper air passages. The most common type of URI is nasopharyngitis and is typically referred to as "the common cold." URIs run their course and usually go away on their own. Most of the time, a URI does not require medical attention, but sometimes a bacterial infection in the upper airways can follow a viral infection. This is called a secondary infection. Sinus and middle ear infections are common types of secondary upper respiratory infections. Bacterial pneumonia can also complicate a URI. A URI can worsen asthma and chronic obstructive pulmonary disease (COPD). Sometimes, these complications can require emergency medical care and may be life threatening.  CAUSES Almost all URIs are caused by viruses. A virus is a type of germ and can spread from one person to another.  RISKS FACTORS You may be at risk for a URI if:   You smoke.   You have chronic heart or lung disease.  You have a weakened defense (immune) system.   You are very young or very old.   You have nasal allergies or asthma.  You work in crowded or poorly ventilated areas.  You work in health care facilities or schools. SIGNS AND SYMPTOMS  Symptoms typically develop 2-3 days after you come in contact with a cold virus. Most viral URIs last 7-10 days. However, viral URIs from the influenza virus (flu virus) can last 14-18 days and are typically more severe. Symptoms may include:   Runny or stuffy (congested) nose.   Sneezing.   Cough.   Sore throat.   Headache.   Fatigue.   Fever.   Loss of appetite.   Pain in your forehead, behind your eyes, and over your cheekbones (sinus pain).  Muscle aches.  DIAGNOSIS  Your health care provider may diagnose a URI by:  Physical exam.  Tests to check that your symptoms are not due to  another condition such as:  Strep throat.  Sinusitis.  Pneumonia.  Asthma. TREATMENT  A URI goes away on its own with time. It cannot be cured with medicines, but medicines may be prescribed or recommended to relieve symptoms. Medicines may help:  Reduce your fever.  Reduce your cough.  Relieve nasal congestion. HOME CARE INSTRUCTIONS   Take medicines only as directed by your health care provider.   Gargle warm saltwater or take cough drops to comfort your throat as directed by your health care provider.  Use a warm mist humidifier or inhale steam from a shower to increase air moisture. This may make it easier to breathe.  Drink enough fluid to keep your urine clear or pale yellow.   Eat soups and other clear broths and maintain good nutrition.   Rest as needed.   Return to work when your temperature has returned to normal or as your health care provider advises. You may need to stay home longer to avoid infecting others. You can also use a face mask and careful hand washing to prevent spread of the virus.  Increase the usage of your inhaler if you have asthma.   Do not use any tobacco products, including cigarettes, chewing tobacco, or electronic cigarettes. If you need help quitting, ask your health care provider. PREVENTION  The best way to protect yourself from getting a cold is to practice good hygiene.   Avoid oral or hand contact with people with cold   symptoms.   Wash your hands often if contact occurs.  There is no clear evidence that vitamin C, vitamin E, echinacea, or exercise reduces the chance of developing a cold. However, it is always recommended to get plenty of rest, exercise, and practice good nutrition.  SEEK MEDICAL CARE IF:   You are getting worse rather than better.   Your symptoms are not controlled by medicine.   You have chills.  You have worsening shortness of breath.  You have brown or red mucus.  You have yellow or brown nasal  discharge.  You have pain in your face, especially when you bend forward.  You have a fever.  You have swollen neck glands.  You have pain while swallowing.  You have white areas in the back of your throat. SEEK IMMEDIATE MEDICAL CARE IF:   You have severe or persistent:  Headache.  Ear pain.  Sinus pain.  Chest pain.  You have chronic lung disease and any of the following:  Wheezing.  Prolonged cough.  Coughing up blood.  A change in your usual mucus.  You have a stiff neck.  You have changes in your:  Vision.  Hearing.  Thinking.  Mood. MAKE SURE YOU:   Understand these instructions.  Will watch your condition.  Will get help right away if you are not doing well or get worse.   This information is not intended to replace advice given to you by your health care provider. Make sure you discuss any questions you have with your health care provider.   Document Released: 02/08/2001 Document Revised: 12/30/2014 Document Reviewed: 11/20/2013 Elsevier Interactive Patient Education 2016 [employer]  

## 2016-04-08 NOTE — Progress Notes (Signed)
Subjective:    Patient ID: Margaret Carlson, female    DOB: 13-May-1976, 40 y.o.   MRN: [id number]  HPI  Pt presents to the clinic today with a complaint of cough x 5 days.  She admits to chest congestion but states it has improved, and reports shortness of breath yesterday when climbing the stairs but has not had any since.  She denies sputum production, hemoptysis, or chest pain.  She admits to sneezing, stuffy nose, clear nasal mucus, drainage down the back of the throat, and watering and redness of the eyes.  She denies runny nose, ear fullness, ear pressure, or ear pain.  She states she had chills last night and felt feverish but did not take her temperature.  She admits to a headache 4 days ago that she describes as sinus pressure across the forehead at a severity of 7/10, but has not had one since then.  She denies associated nausea, vomiting, or changes in vision.  She has tried Claritin and Benadryl with some relief.  She denies history of allergies or exposure to sick contacts.  Review of Systems Past Medical History:  Diagnosis Date  . Hypertension    Past Surgical History:  Procedure Laterality Date  . LAPAROSCOPIC GASTRIC SLEEVE RESECTION  10/2012   Family History  Problem Relation Age of Onset  . Arthritis Maternal Grandmother   . Cancer Maternal Grandmother     breast  . Hypertension Maternal Grandmother   . Breast cancer Maternal Grandmother    Current Outpatient Prescriptions on File Prior to Visit  Medication Sig Dispense Refill  . acyclovir (ZOVIRAX) 400 MG tablet Take 400 mg by mouth daily as needed.    . Calcium-Vitamin D-Vitamin K (CALCIUM + D + K PO) Take 1 capsule by mouth daily.    . cyanocobalamin 2000 MCG tablet Take 2,000 mcg by mouth daily.    . methocarbamol (ROBAXIN) 500 MG tablet Take 1 tablet (500 mg total) by mouth 3 (three) times daily as needed for muscle spasms. 30 tablet 0  . Multiple Vitamin (MULTIVITAMIN) tablet Take 1 tablet by mouth daily.    .  potassium chloride (K-DUR,KLOR-CON) 10 MEQ tablet Take 1 tablet (10 mEq total) by mouth 2 (two) times daily. 180 tablet 0  . PREVIFEM 0.25-35 MG-MCG tablet Take 1 tablet by mouth daily.    . hydrochlorothiazide (HYDRODIURIL) 25 MG tablet Take 1 tablet (25 mg total) by mouth daily. MUST SCHEDULE ANNUAL PHYSICAL FOR August 2017 (Patient not taking: Reported on 04/08/2016) 90 tablet 1   No current facility-administered medications on file prior to visit.    Allergies  Allergen Reactions  . Ranitidine Swelling    Face and lip swelling    Const: Pt reports chills, subjective fever, and headache. HEENT: Pt reports sneezing, nasal congestion, and watery/red eyes. Denies vision changes, runny nose, ear pain, ear pressure or ear fullness. Pulm: Pt reports cough, chest congestion, and shortness of breath.  Denies sputum production or hemoptysis. CV: Denies chest pain. GI: Denies nausea or vomiting.     Objective:   Physical Exam  BP (!) 138/96   Pulse 94   Temp 99.5 F (37.5 C) (Oral)   Wt 292 lb (132.5 kg)   SpO2 97%   BMI 48.59 kg/m   General: Well-appearing, in no acute distress. HEENT: No scleral icterus or conjunctival injection.  TMs pearly grey.  Slight erythema and inflammation of nasal turbinates.  No erythema or exudates of pharynx.  Sinuses non-tender to  palpation. Neck: No lymphadenopathy noted. Pulm: Clear to auscultation bilaterally.  No wheezes, rales, or rhonchi. CV: Regular rate and rhythm.  No murmurs, rubs, or gallops.     Assessment & Plan:   Viral URI:  Continue Claritin Tylenol for fevers  Rx for Hycodan 3ml q8hrs as needed for cough Call if symptoms worsen or do not resolve  BAITY, REGINA, NP

## 2016-06-08 ENCOUNTER — Encounter: Payer: Self-pay | Admitting: Internal Medicine

## 2016-06-08 ENCOUNTER — Ambulatory Visit (INDEPENDENT_AMBULATORY_CARE_PROVIDER_SITE_OTHER): Payer: 59 | Admitting: Internal Medicine

## 2016-06-08 VITALS — BP 148/92 | HR 73 | Temp 98.3°F | Ht 65.0 in | Wt 286.0 lb

## 2016-06-08 DIAGNOSIS — Z113 Encounter for screening for infections with a predominantly sexual mode of transmission: Secondary | ICD-10-CM | POA: Diagnosis not present

## 2016-06-08 DIAGNOSIS — E782 Mixed hyperlipidemia: Secondary | ICD-10-CM | POA: Diagnosis not present

## 2016-06-08 DIAGNOSIS — Z0000 Encounter for general adult medical examination without abnormal findings: Secondary | ICD-10-CM | POA: Diagnosis not present

## 2016-06-08 DIAGNOSIS — I10 Essential (primary) hypertension: Secondary | ICD-10-CM | POA: Diagnosis not present

## 2016-06-08 MED ORDER — AMLODIPINE BESYLATE 5 MG PO TABS: 5.0000 mg | ORAL_TABLET | Freq: Every day | ORAL | 0 refills | Status: DC

## 2016-06-08 NOTE — Assessment & Plan Note (Signed)
Not controlled, secondary to noncompliance Discussed DASH diet and exercising to lose weight eRx for Norvasc 5 mg daily  RTC in 3 weeks for BP recheck

## 2016-06-08 NOTE — Progress Notes (Signed)
Subjective:    Patient ID: Margaret Carlson, female    DOB: 1976-04-05, 40 y.o.   MRN: [id number]  HPI  Pt presents to the clinic today for her annual exam. She is also due to follow up HTN. She is supposed to be taking HCTZ and a potassium supplement. She does not take it because it makes her urinate a lot. Her BP today is 148/92. She denies headache, dizziness, blurred vision, chest pain or shortness of breath.  Flu: 06/08/2016, with her employer Tetanus: 02/2015 Pap Smear: 04/2016- normal, she gets pap every 6 months due to history of abnormal paps Mammogram: 09/2015 at [place] Screening: yearly Dentist: biannually  Diet: She does eat meat. She consumes fruits and veggies most days of the week. She cut out fried food. She drinks mostly water.  Exercise: She does bootcamp 3 x week for 1 hour, she does other aerobic classes 2 x week.   Review of Systems      Past Medical History:  Diagnosis Date  . Hypertension     Current Outpatient Prescriptions  Medication Sig Dispense Refill  . acyclovir (ZOVIRAX) 400 MG tablet Take 400 mg by mouth daily as needed.    . Calcium-Vitamin D-Vitamin K (CALCIUM + D + K PO) Take 1 capsule by mouth daily.    . cyanocobalamin 2000 MCG tablet Take 2,000 mcg by mouth daily.    . hydrochlorothiazide (HYDRODIURIL) 25 MG tablet Take 1 tablet (25 mg total) by mouth daily. MUST SCHEDULE ANNUAL PHYSICAL FOR August 2017 (Patient not taking: Reported on 04/08/2016) 90 tablet 1  . HYDROcodone-homatropine (HYCODAN) 5-1.5 MG/5ML syrup Take 5 mLs by mouth every 8 (eight) hours as needed for cough. 120 mL 0  . methocarbamol (ROBAXIN) 500 MG tablet Take 1 tablet (500 mg total) by mouth 3 (three) times daily as needed for muscle spasms. 30 tablet 0  . Multiple Vitamin (MULTIVITAMIN) tablet Take 1 tablet by mouth daily.    . potassium chloride (K-DUR,KLOR-CON) 10 MEQ tablet Take 1 tablet (10 mEq total) by mouth 2 (two) times daily. 180 tablet 0  . PREVIFEM  0.25-35 MG-MCG tablet Take 1 tablet by mouth daily.     No current facility-administered medications for this visit.     Allergies  Allergen Reactions  . Ranitidine Swelling    Face and lip swelling    Family History  Problem Relation Age of Onset  . Arthritis Maternal Grandmother   . Cancer Maternal Grandmother     breast  . Hypertension Maternal Grandmother   . Breast cancer Maternal Grandmother     Social History   Social History  . Marital status: Single    Spouse name: N/A  . Number of children: N/A  . Years of education: N/A   Occupational History  . Not on file.   Social History Main Topics  . Smoking status: Never Smoker  . Smokeless tobacco: Never Used  . Alcohol use No  . Drug use: No  . Sexual activity: Yes   Other Topics Concern  . Not on file   Social History Narrative  . No narrative on file     Constitutional: Denies fever, malaise, fatigue, headache or abrupt weight changes.  HEENT: Denies eye pain, eye redness, ear pain, ringing in the ears, wax buildup, runny nose, nasal congestion, bloody nose, or sore throat. Respiratory: Denies difficulty breathing, shortness of breath, cough or sputum production.   Cardiovascular: Denies chest pain, chest tightness, palpitations or swelling in  the hands or feet.  Gastrointestinal: Denies abdominal pain, bloating, constipation, diarrhea or blood in the stool.  GU: Denies urgency, frequency, pain with urination, burning sensation, blood in urine, odor or discharge. Musculoskeletal: Denies decrease in range of motion, difficulty with gait, muscle pain or joint pain and swelling.  Skin: Denies redness, rashes, lesions or ulcercations.  Neurological: Denies dizziness, difficulty with memory, difficulty with speech or problems with balance and coordination.  Psych: Denies anxiety, depression, SI/HI.  No other specific complaints in a complete review of systems (except as listed in HPI above).  Objective:    Physical Exam   BP (!) 148/92   Pulse 73   Temp 98.3 F (36.8 C) (Oral)   Ht 5\' 5"  (1.651 m)   Wt 286 lb (129.7 kg)   LMP 06/07/2016   SpO2 98%   BMI 47.59 kg/m   Wt Readings from Last 3 Encounters:  04/08/16 292 lb (132.5 kg)  10/23/15 293 lb 8 oz (133.1 kg)  05/26/15 288 lb 8 oz (130.9 kg)    General: Appears her stated age, obese in NAD. Skin: Warm, dry and intact.  HEENT: Head: normal shape and size; Eyes: sclera white, no icterus, conjunctiva pink, PERRLA and EOMs intact; Ears: Tm's gray and intact, normal light reflex; septum midline; Throat/Mouth: Teeth present, mucosa pink and moist, no exudate, lesions or ulcerations noted.  Neck: Neck supple, trachea midline. No masses, lumps or thyromegaly present.  Cardiovascular: Normal rate and rhythm. S1,S2 noted.  No murmur, rubs or gallops noted. No JVD or BLE edema.  Pulmonary/Chest: Normal effort and positive vesicular breath sounds. No respiratory distress. No wheezes, rales or ronchi noted.  Abdomen: Soft and nontender. Normal bowel sounds. No distention or masses noted. Liver, spleen and kidneys non palpable. Musculoskeletal: Normal range of motion. Strength 5/5 BUE/BLE. No difficulty with gait.  Neurological: Alert and oriented. Cranial nerves II-XII grossly intact. Coordination normal.  Psychiatric: Mood and affect normal. Behavior is normal. Judgment and thought content normal.     BMET    Component Value Date/Time   NA 138 05/26/2015 1339   NA 137 07/03/2013 2132   K 4.0 05/26/2015 1339   K 3.6 07/03/2013 2132   CL 101 05/26/2015 1339   CL 103 07/03/2013 2132   CO2 26 05/26/2015 1339   CO2 29 07/03/2013 2132   GLUCOSE 77 05/26/2015 1339   GLUCOSE 90 07/03/2013 2132   BUN 13 05/26/2015 1339   BUN 9 07/03/2013 2132   CREATININE 0.69 05/26/2015 1339   CREATININE 0.85 07/03/2013 2132   CALCIUM 8.4 (L) 05/26/2015 1339   CALCIUM 9.0 07/03/2013 2132   GFRNONAA 110 05/26/2015 1339   GFRNONAA >60 07/03/2013 2132     GFRAA 127 05/26/2015 1339   GFRAA >60 07/03/2013 2132    Lipid Panel     Component Value Date/Time   CHOL 227 (H) 03/17/2015 1618   TRIG 62 03/17/2015 1618   HDL 62 03/17/2015 1618   CHOLHDL 3.7 03/17/2015 1618   LDLCALC 153 (H) 03/17/2015 1618    CBC    Component Value Date/Time   WBC 4.9 03/17/2015 1618   WBC 8.6 07/03/2013 2132   RBC 4.96 03/17/2015 1618   RBC 5.47 (H) 07/03/2013 2132   HGB 13.9 07/03/2013 2132   HCT 36.7 03/17/2015 1618   PLT 249 03/17/2015 1618   MCV 74 (L) 03/17/2015 1618   MCV 73 (L) 07/03/2013 2132   MCH 25.2 (L) 03/17/2015 1618   MCH 25.3 (L) 07/03/2013  2132   MCHC 34.1 03/17/2015 1618   MCHC 34.5 07/03/2013 2132   RDW 17.1 (H) 03/17/2015 1618   RDW 19.1 (H) 07/03/2013 2132   LYMPHSABS 2.5 03/17/2015 1618   EOSABS 0.1 03/17/2015 1618   BASOSABS 0.0 03/17/2015 1618    Hgb A1C No results found for: HGBA1C      Assessment & Plan:   Preventative Health Maintenance:  Flu shot and Tetanus UTD She is past due for her mammogram per her report, she will call to set this up Pap smear UTD Encouraged her to continue seeing a dentist yearly Encouraged her to work on diet and exercise Will check CBC, CMET, TSH, Lipid, A1C.  HIV and HSV today  RTC in 1 year, sooner if needed Webb Silversmith, NP

## 2016-06-08 NOTE — Patient Instructions (Signed)
Health Maintenance, Female Adopting a healthy lifestyle and getting preventive care can go a [place] way to promote health and wellness. Talk with your health care provider about what schedule of regular examinations is right for you. This is a good chance for you to check in with your provider about disease prevention and staying healthy. In between checkups, there are plenty of things you can do on your own. Experts have done a lot of research about which lifestyle changes and preventive measures are most likely to keep you healthy. Ask your health care provider for more information. WEIGHT AND DIET  Eat a healthy diet  Be sure to include plenty of vegetables, fruits, low-fat dairy products, and lean protein.  Do not eat a lot of foods high in solid fats, added sugars, or salt.  Get regular exercise. This is one of the most important things you can do for your health.  Most adults should exercise for at least 150 minutes each week. The exercise should increase your heart rate and make you sweat (moderate-intensity exercise).  Most adults should also do strengthening exercises at least twice a week. This is in addition to the moderate-intensity exercise.  Maintain a healthy weight  Body mass index (BMI) is a measurement that can be used to identify possible weight problems. It estimates body fat based on height and weight. Your health care provider can help determine your BMI and help you achieve or maintain a healthy weight.  For females 20 years of age and older:   A BMI below 18.5 is considered underweight.  A BMI of 18.5 to 24.9 is normal.  A BMI of 25 to 29.9 is considered overweight.  A BMI of 30 and above is considered obese.  Watch levels of cholesterol and blood lipids  You should start having your blood tested for lipids and cholesterol at 40 years of age, then have this test every 5 years.  You may need to have your cholesterol levels checked more often if:  Your lipid  or cholesterol levels are high.  You are older than 40 years of age.  You are at high risk for heart disease.  CANCER SCREENING   Lung Cancer  Lung cancer screening is recommended for adults 55-80 years old who are at high risk for lung cancer because of a history of smoking.  A yearly low-dose CT scan of the lungs is recommended for people who:  Currently smoke.  Have quit within the past 15 years.  Have at least a 30-pack-year history of smoking. A pack year is smoking an average of one pack of cigarettes a day for 1 year.  Yearly screening should continue until it has been 15 years since you quit.  Yearly screening should stop if you develop a health problem that would prevent you from having lung cancer treatment.  Breast Cancer  Practice breast self-awareness. This means understanding how your breasts normally appear and feel.  It also means doing regular breast self-exams. Let your health care provider know about any changes, no matter how small.  If you are in your 20s or 30s, you should have a clinical breast exam (CBE) by a health care provider every 1-3 years as part of a regular health exam.  If you are 40 or older, have a CBE every year. Also consider having a breast X-ray (mammogram) every year.  If you have a family history of breast cancer, talk to your health care provider about genetic screening.  If you   are at high risk for breast cancer, talk to your health care provider about having an MRI and a mammogram every year.  Breast cancer gene (BRCA) assessment is recommended for women who have family members with BRCA-related cancers. BRCA-related cancers include:  Breast.  Ovarian.  Tubal.  Peritoneal cancers.  Results of the assessment will determine the need for genetic counseling and BRCA1 and BRCA2 testing. Cervical Cancer Your health care provider may recommend that you be screened regularly for cancer of the pelvic organs (ovaries, uterus, and  vagina). This screening involves a pelvic examination, including checking for microscopic changes to the surface of your cervix (Pap test). You may be encouraged to have this screening done every 3 years, beginning at age 21.  For women ages 30-65, health care providers may recommend pelvic exams and Pap testing every 3 years, or they may recommend the Pap and pelvic exam, combined with testing for human papilloma virus (HPV), every 5 years. Some types of HPV increase your risk of cervical cancer. Testing for HPV may also be done on women of any age with unclear Pap test results.  Other health care providers may not recommend any screening for nonpregnant women who are considered low risk for pelvic cancer and who do not have symptoms. Ask your health care provider if a screening pelvic exam is right for you.  If you have had past treatment for cervical cancer or a condition that could lead to cancer, you need Pap tests and screening for cancer for at least 20 years after your treatment. If Pap tests have been discontinued, your risk factors (such as having a new sexual partner) need to be reassessed to determine if screening should resume. Some women have medical problems that increase the chance of getting cervical cancer. In these cases, your health care provider may recommend more frequent screening and Pap tests. Colorectal Cancer  This type of cancer can be detected and often prevented.  Routine colorectal cancer screening usually begins at 40 years of age and continues through 40 years of age.  Your health care provider may recommend screening at an earlier age if you have risk factors for colon cancer.  Your health care provider may also recommend using home test kits to check for hidden blood in the stool.  A small camera at the end of a tube can be used to examine your colon directly (sigmoidoscopy or colonoscopy). This is done to check for the earliest forms of colorectal  cancer.  Routine screening usually begins at age 50.  Direct examination of the colon should be repeated every 5-10 years through 40 years of age. However, you may need to be screened more often if early forms of precancerous polyps or small growths are found. Skin Cancer  Check your skin from head to toe regularly.  Tell your health care provider about any new moles or changes in moles, especially if there is a change in a mole's shape or color.  Also tell your health care provider if you have a mole that is larger than the size of a pencil eraser.  Always use sunscreen. Apply sunscreen liberally and repeatedly throughout the day.  Protect yourself by wearing [place] sleeves, pants, a wide-brimmed hat, and sunglasses whenever you are outside. HEART DISEASE, DIABETES, AND HIGH BLOOD PRESSURE   High blood pressure causes heart disease and increases the risk of stroke. High blood pressure is more likely to develop in:  People who have blood pressure in the high end   of the normal range (130-139/85-89 mm Hg).  People who are overweight or obese.  People who are African American.  If you are 38-23 years of age, have your blood pressure checked every 3-5 years. If you are 61 years of age or older, have your blood pressure checked every year. You should have your blood pressure measured twice--once when you are at a hospital or clinic, and once when you are not at a hospital or clinic. Record the average of the two measurements. To check your blood pressure when you are not at a hospital or clinic, you can use:  An automated blood pressure machine at a pharmacy.  A home blood pressure monitor.  If you are between 45 years and 39 years old, ask your health care provider if you should take aspirin to prevent strokes.  Have regular diabetes screenings. This involves taking a blood sample to check your fasting blood sugar level.  If you are at a normal weight and have a low risk for diabetes,  have this test once every three years after 40 years of age.  If you are overweight and have a high risk for diabetes, consider being tested at a younger age or more often. PREVENTING INFECTION  Hepatitis B  If you have a higher risk for hepatitis B, you should be screened for this virus. You are considered at high risk for hepatitis B if:  You were born in a country where hepatitis B is common. Ask your health care provider which countries are considered high risk.  Your parents were born in a high-risk country, and you have not been immunized against hepatitis B (hepatitis B vaccine).  You have HIV or AIDS.  You use needles to inject street drugs.  You live with someone who has hepatitis B.  You have had sex with someone who has hepatitis B.  You get hemodialysis treatment.  You take certain medicines for conditions, including cancer, organ transplantation, and autoimmune conditions. Hepatitis C  Blood testing is recommended for:  Everyone born from [birth year] through 1965.  Anyone with known risk factors for hepatitis C. Sexually transmitted infections (STIs)  You should be screened for sexually transmitted infections (STIs) including gonorrhea and chlamydia if:  You are sexually active and are younger than 40 years of age.  You are older than 40 years of age and your health care provider tells you that you are at risk for this type of infection.  Your sexual activity has changed since you were last screened and you are at an increased risk for chlamydia or gonorrhea. Ask your health care provider if you are at risk.  If you do not have HIV, but are at risk, it may be recommended that you take a prescription medicine daily to prevent HIV infection. This is called pre-exposure prophylaxis (PrEP). You are considered at risk if:  You are sexually active and do not regularly use condoms or know the HIV status of your partner(s).  You take drugs by injection.  You are sexually  active with a partner who has HIV. Talk with your health care provider about whether you are at high risk of being infected with HIV. If you choose to begin PrEP, you should first be tested for HIV. You should then be tested every 3 months for as [place] as you are taking PrEP.  PREGNANCY   If you are premenopausal and you may become pregnant, ask your health care provider about preconception counseling.  If you may  become pregnant, take 400 to 800 micrograms (mcg) of folic acid every day.  If you want to prevent pregnancy, talk to your health care provider about birth control (contraception). OSTEOPOROSIS AND MENOPAUSE   Osteoporosis is a disease in which the bones lose minerals and strength with aging. This can result in serious bone fractures. Your risk for osteoporosis can be identified using a bone density scan.  If you are 61 years of age or older, or if you are at risk for osteoporosis and fractures, ask your health care provider if you should be screened.  Ask your health care provider whether you should take a calcium or vitamin D supplement to lower your risk for osteoporosis.  Menopause may have certain physical symptoms and risks.  Hormone replacement therapy may reduce some of these symptoms and risks. Talk to your health care provider about whether hormone replacement therapy is right for you.  HOME CARE INSTRUCTIONS   Schedule regular health, dental, and eye exams.  Stay current with your immunizations.   Do not use any tobacco products including cigarettes, chewing tobacco, or electronic cigarettes.  If you are pregnant, do not drink alcohol.  If you are breastfeeding, limit how much and how often you drink alcohol.  Limit alcohol intake to no more than 1 drink per day for nonpregnant women. One drink equals 12 ounces of beer, 5 ounces of wine, or 1 ounces of hard liquor.  Do not use street drugs.  Do not share needles.  Ask your health care provider for help if  you need support or information about quitting drugs.  Tell your health care provider if you often feel depressed.  Tell your health care provider if you have ever been abused or do not feel safe at home.   This information is not intended to replace advice given to you by your health care provider. Make sure you discuss any questions you have with your health care provider.   Document Released: 02/28/2011 Document Revised: 09/05/2014 Document Reviewed: 07/17/2013 Elsevier Interactive Patient Education [employer].

## 2016-06-09 ENCOUNTER — Other Ambulatory Visit: Payer: Self-pay | Admitting: Advanced Practice Midwife

## 2016-06-09 DIAGNOSIS — R928 Other abnormal and inconclusive findings on diagnostic imaging of breast: Secondary | ICD-10-CM

## 2016-06-09 DIAGNOSIS — Z1231 Encounter for screening mammogram for malignant neoplasm of breast: Secondary | ICD-10-CM

## 2016-06-11 LAB — COMPREHENSIVE METABOLIC PANEL
ALBUMIN: 3.4 g/dL — AB (ref 3.5–5.5)
ALT: 7 IU/L (ref 0–32)
AST: 15 IU/L (ref 0–40)
Albumin/Globulin Ratio: 0.9 — ABNORMAL LOW (ref 1.2–2.2)
Alkaline Phosphatase: 50 IU/L (ref 39–117)
BUN / CREAT RATIO: 17 (ref 9–23)
BUN: 16 mg/dL (ref 6–24)
Bilirubin Total: <0.2 mg/dL (ref 0.0–1.2)
CALCIUM: 8.8 mg/dL (ref 8.7–10.2)
CHLORIDE: 103 mmol/L (ref 96–106)
CO2: 25 mmol/L (ref 18–29)
CREATININE: 0.96 mg/dL (ref 0.57–1.00)
GFR, EST AFRICAN AMERICAN: 86 mL/min/{1.73_m2} (ref >59)
GFR, EST NON AFRICAN AMERICAN: 74 mL/min/{1.73_m2} (ref >59)
GLUCOSE: 115 mg/dL — AB (ref 65–99)
Globulin, Total: 3.7 g/dL (ref 1.5–4.5)
Potassium: 3.8 mmol/L (ref 3.5–5.2)
Sodium: 142 mmol/L (ref 134–144)
TOTAL PROTEIN: 7.1 g/dL (ref 6.0–8.5)

## 2016-06-11 LAB — TSH: TSH: 0.695 u[IU]/mL (ref 0.450–4.500)

## 2016-06-11 LAB — LIPID PANEL
CHOLESTEROL TOTAL: 222 mg/dL — AB (ref 100–199)
Chol/HDL Ratio: 4 ratio units (ref 0.0–4.4)
HDL: 56 mg/dL (ref >39)
LDL Calculated: 153 mg/dL — ABNORMAL HIGH (ref 0–99)
Triglycerides: 66 mg/dL (ref 0–149)
VLDL CHOLESTEROL CAL: 13 mg/dL (ref 5–40)

## 2016-06-11 LAB — CBC
HEMOGLOBIN: 11.9 g/dL (ref 11.1–15.9)
Hematocrit: 34.8 % (ref 34.0–46.6)
MCH: 24.6 pg — AB (ref 26.6–33.0)
MCHC: 34.2 g/dL (ref 31.5–35.7)
MCV: 72 fL — ABNORMAL LOW (ref 79–97)
PLATELETS: 305 10*3/uL (ref 150–379)
RBC: 4.84 x10E6/uL (ref 3.77–5.28)
RDW: 18.1 % — ABNORMAL HIGH (ref 12.3–15.4)
WBC: 4.3 10*3/uL (ref 3.4–10.8)

## 2016-06-11 LAB — HIV ANTIBODY (ROUTINE TESTING W REFLEX): HIV SCREEN 4TH GENERATION: NONREACTIVE

## 2016-06-11 LAB — HSV(HERPES SIMPLEX VRS) I + II AB-IGM: HSVI/II COMB AB IGM: 1.06 ratio — AB (ref 0.00–0.90)

## 2016-06-11 LAB — HEMOGLOBIN A1C
Est. average glucose Bld gHb Est-mCnc: 94 mg/dL
Hgb A1c MFr Bld: 4.9 % (ref 4.8–5.6)

## 2016-06-13 ENCOUNTER — Ambulatory Visit (INDEPENDENT_AMBULATORY_CARE_PROVIDER_SITE_OTHER): Payer: 59 | Admitting: Podiatry

## 2016-06-13 ENCOUNTER — Encounter: Payer: Self-pay | Admitting: Podiatry

## 2016-06-13 DIAGNOSIS — L603 Nail dystrophy: Secondary | ICD-10-CM | POA: Diagnosis not present

## 2016-06-13 NOTE — Progress Notes (Signed)
   Subjective:    Patient ID: Margaret Carlson, female    DOB: 06-24-1976, 40 y.o.   MRN: [id number]  HPI: She presents today with chief complaint of painful thick toenails to the hallux bilateral left greater than the right states that they May have fungus noted like to see by getting it treated.    Review of Systems  All other systems reviewed and are negative.      Objective:   Physical Exam: Vital signs are stable alert and oriented 3. Pulses are palpable. Neurologic sensorium is intact. Deep tendon reflexes are intact muscle strength was 5 over 5 dorsiflexion plantar flexors and inverters everters on physical musculatures intact. Orthopedic evaluation demonstrates all joints distal to the ankle full range of motion but crepitation. Cutaneous evaluation of the [place] thick toenails to the distal aspect of the hallux nails bilateral. Remainder of the nails appear to be normal. She has had a matrixectomy or injury to the left hallux nail plate.        Assessment & Plan:  Assessment: Nail dystrophy Rule out onychomycosis hallux bilateral.  Plan: Samples of the nail skin were taken today to be sent for pathologic evaluation will notify her once the results have arrived.

## 2016-06-14 DIAGNOSIS — E782 Mixed hyperlipidemia: Secondary | ICD-10-CM | POA: Insufficient documentation

## 2016-06-14 MED ORDER — SIMVASTATIN 10 MG PO TABS: 10.0000 mg | ORAL_TABLET | Freq: Every day | ORAL | 2 refills | Status: DC

## 2016-06-14 NOTE — Addendum Note (Signed)
Addended by: Lurlean Nanny on: 06/14/2016 10:17 AM   Modules accepted: Orders

## 2016-06-14 NOTE — Addendum Note (Signed)
Addended by: Lurlean Nanny on: 06/14/2016 11:05 AM   Modules accepted: Orders

## 2016-06-23 ENCOUNTER — Ambulatory Visit
Admission: RE | Admit: 2016-06-23 | Discharge: 2016-06-23 | Disposition: A | Discharge status: Home / Self Care | Payer: 59 | Source: Ambulatory Visit | Attending: Advanced Practice Midwife | Admitting: Advanced Practice Midwife

## 2016-06-23 DIAGNOSIS — Z1231 Encounter for screening mammogram for malignant neoplasm of breast: Secondary | ICD-10-CM

## 2016-06-23 DIAGNOSIS — R928 Other abnormal and inconclusive findings on diagnostic imaging of breast: Secondary | ICD-10-CM | POA: Insufficient documentation

## 2016-06-29 ENCOUNTER — Ambulatory Visit (INDEPENDENT_AMBULATORY_CARE_PROVIDER_SITE_OTHER): Payer: 59 | Admitting: Internal Medicine

## 2016-06-29 ENCOUNTER — Encounter: Payer: Self-pay | Admitting: Internal Medicine

## 2016-06-29 VITALS — BP 142/92 | HR 69 | Temp 98.4°F | Wt 286.0 lb

## 2016-06-29 DIAGNOSIS — I10 Essential (primary) hypertension: Secondary | ICD-10-CM

## 2016-06-29 MED ORDER — AMLODIPINE BESYLATE 10 MG PO TABS: 10.0000 mg | ORAL_TABLET | Freq: Every day | ORAL | 0 refills | Status: DC

## 2016-06-29 NOTE — Assessment & Plan Note (Signed)
Still not controlled Increase Norvasc to 10 mg daily  RTC in 3 weeks to follow up HTN

## 2016-06-29 NOTE — Progress Notes (Signed)
Subjective:    Patient ID: Margaret Carlson, female    DOB: 1975/09/02, 40 y.o.   MRN: [id number]  HPI  Pt presents to the clinic today for 3 week follow up of HTN. Her BP at her last visit was 148/92. She was started on Norvasc 5 mg daily. She has been taking the medication as prescribed and denies adverse side effects. Her BP today is 142/92. She is very upset about this because she does not want to take any medication. ECG from 02/2015 reviewed.  Review of Systems      Past Medical History:  Diagnosis Date  . Hypertension     Current Outpatient Prescriptions  Medication Sig Dispense Refill  . acyclovir (ZOVIRAX) 400 MG tablet Take 400 mg by mouth daily as needed.    Marland Kitchen amLODipine (NORVASC) 5 MG tablet Take 1 tablet (5 mg total) by mouth daily. 30 tablet 0  . Calcium-Vitamin D-Vitamin K (CALCIUM + D + K PO) Take 1 capsule by mouth daily.    . cyanocobalamin 2000 MCG tablet Take 2,000 mcg by mouth daily.    . Multiple Vitamin (MULTIVITAMIN) tablet Take 1 tablet by mouth daily.    Marland Kitchen PREVIFEM 0.25-35 MG-MCG tablet Take 1 tablet by mouth daily.    . simvastatin (ZOCOR) 10 MG tablet Take 1 tablet (10 mg total) by mouth at bedtime. 30 tablet 2   No current facility-administered medications for this visit.     Allergies  Allergen Reactions  . Ranitidine Swelling    Face and lip swelling    Family History  Problem Relation Age of Onset  . Arthritis Maternal Grandmother   . Cancer Maternal Grandmother     breast  . Hypertension Maternal Grandmother   . Breast cancer Maternal Grandmother 77    Social History   Social History  . Marital status: Single    Spouse name: N/A  . Number of children: N/A  . Years of education: N/A   Occupational History  . Not on file.   Social History Main Topics  . Smoking status: Never Smoker  . Smokeless tobacco: Never Used  . Alcohol use No  . Drug use: No  . Sexual activity: Yes   Other Topics Concern  . Not on file   Social  History Narrative  . No narrative on file     Constitutional: Denies fever, malaise, fatigue, headache or abrupt weight changes.  Respiratory: Denies difficulty breathing, shortness of breath, cough or sputum production.   Cardiovascular: Denies chest pain, chest tightness, palpitations or swelling in the hands or feet.   No other specific complaints in a complete review of systems (except as listed in HPI above).  Objective:   Physical Exam   BP (!) 142/92   Pulse 69   Temp 98.4 F (36.9 C) (Oral)   Wt 286 lb (129.7 kg)   LMP 06/07/2016   SpO2 99%   BMI 47.59 kg/m  Wt Readings from Last 3 Encounters:  06/29/16 286 lb (129.7 kg)  06/08/16 286 lb (129.7 kg)  04/08/16 292 lb (132.5 kg)    General: Appears her stated age, obese in NAD. Cardiovascular: Normal rate and rhythm. S1,S2 noted.  No murmur, rubs or gallops noted.  Pulmonary/Chest: Normal effort and positive vesicular breath sounds. No respiratory distress. No wheezes, rales or ronchi noted.  Neurological: Alert and oriented.   BMET    Component Value Date/Time   NA 142 06/08/2016 1528   NA 137 07/03/2013 2132  K 3.8 06/08/2016 1528   K 3.6 07/03/2013 2132   CL 103 06/08/2016 1528   CL 103 07/03/2013 2132   CO2 25 06/08/2016 1528   CO2 29 07/03/2013 2132   GLUCOSE 115 (H) 06/08/2016 1528   GLUCOSE 90 07/03/2013 2132   BUN 16 06/08/2016 1528   BUN 9 07/03/2013 2132   CREATININE 0.96 06/08/2016 1528   CREATININE 0.85 07/03/2013 2132   CALCIUM 8.8 06/08/2016 1528   CALCIUM 9.0 07/03/2013 2132   GFRNONAA 74 06/08/2016 1528   GFRNONAA >60 07/03/2013 2132   GFRAA 86 06/08/2016 1528   GFRAA >60 07/03/2013 2132    Lipid Panel     Component Value Date/Time   CHOL 222 (H) 06/08/2016 1528   TRIG 66 06/08/2016 1528   HDL 56 06/08/2016 1528   CHOLHDL 4.0 06/08/2016 1528   LDLCALC 153 (H) 06/08/2016 1528    CBC    Component Value Date/Time   WBC 4.3 06/08/2016 1528   WBC 8.6 07/03/2013 2132   RBC  4.84 06/08/2016 1528   RBC 5.47 (H) 07/03/2013 2132   HGB 13.9 07/03/2013 2132   HCT 34.8 06/08/2016 1528   PLT 305 06/08/2016 1528   MCV 72 (L) 06/08/2016 1528   MCV 73 (L) 07/03/2013 2132   MCH 24.6 (L) 06/08/2016 1528   MCH 25.3 (L) 07/03/2013 2132   MCHC 34.2 06/08/2016 1528   MCHC 34.5 07/03/2013 2132   RDW 18.1 (H) 06/08/2016 1528   RDW 19.1 (H) 07/03/2013 2132   LYMPHSABS 2.5 03/17/2015 1618   EOSABS 0.1 03/17/2015 1618   BASOSABS 0.0 03/17/2015 1618    Hgb A1C Lab Results  Component Value Date   HGBA1C 4.9 06/08/2016           Assessment & Plan:

## 2016-06-29 NOTE — Patient Instructions (Signed)
Hypertension Hypertension, commonly called high blood pressure, is when the force of blood pumping through your arteries is too strong. Your arteries are the blood vessels that carry blood from your heart throughout your body. A blood pressure reading consists of a higher number over a lower number, such as 110/72. The higher number (systolic) is the pressure inside your arteries when your heart pumps. The lower number (diastolic) is the pressure inside your arteries when your heart relaxes. Ideally you want your blood pressure below 120/80. Hypertension forces your heart to work harder to pump blood. Your arteries may become narrow or stiff. Having untreated or uncontrolled hypertension can cause heart attack, stroke, kidney disease, and other problems. RISK FACTORS Some risk factors for high blood pressure are controllable. Others are not.  Risk factors you cannot control include:   Race. You may be at higher risk if you are African American.  Age. Risk increases with age.  Gender. Men are at higher risk than women before age 45 years. After age 65, women are at higher risk than men. Risk factors you can control include:  Not getting enough exercise or physical activity.  Being overweight.  Getting too much fat, sugar, calories, or salt in your diet.  Drinking too much alcohol. SIGNS AND SYMPTOMS Hypertension does not usually cause signs or symptoms. Extremely high blood pressure (hypertensive crisis) may cause headache, anxiety, shortness of breath, and nosebleed. DIAGNOSIS To check if you have hypertension, your health care provider will measure your blood pressure while you are seated, with your arm held at the level of your heart. It should be measured at least twice using the same arm. Certain conditions can cause a difference in blood pressure between your right and left arms. A blood pressure reading that is higher than normal on one occasion does not mean that you need treatment. If  it is not clear whether you have high blood pressure, you may be asked to return on a different day to have your blood pressure checked again. Or, you may be asked to monitor your blood pressure at home for 1 or more weeks. TREATMENT Treating high blood pressure includes making lifestyle changes and possibly taking medicine. Living a healthy lifestyle can help lower high blood pressure. You may need to change some of your habits. Lifestyle changes may include:  Following the DASH diet. This diet is high in fruits, vegetables, and whole grains. It is low in salt, red meat, and added sugars.  Keep your sodium intake below 2,300 mg per day.  Getting at least 30-45 minutes of aerobic exercise at least 4 times per week.  Losing weight if necessary.  Not smoking.  Limiting alcoholic beverages.  Learning ways to reduce stress. Your health care provider may prescribe medicine if lifestyle changes are not enough to get your blood pressure under control, and if one of the following is true:  You are 18-59 years of age and your systolic blood pressure is above 140.  You are 60 years of age or older, and your systolic blood pressure is above 150.  Your diastolic blood pressure is above 90.  You have diabetes, and your systolic blood pressure is over 140 or your diastolic blood pressure is over 90.  You have kidney disease and your blood pressure is above 140/90.  You have heart disease and your blood pressure is above 140/90. Your personal target blood pressure may vary depending on your medical conditions, your age, and other factors. HOME CARE INSTRUCTIONS    Have your blood pressure rechecked as directed by your health care provider.   Take medicines only as directed by your health care provider. Follow the directions carefully. Blood pressure medicines must be taken as prescribed. The medicine does not work as well when you skip doses. Skipping doses also puts you at risk for  problems.  Do not smoke.   Monitor your blood pressure at home as directed by your health care provider. SEEK MEDICAL CARE IF:   You think you are having a reaction to medicines taken.  You have recurrent headaches or feel dizzy.  You have swelling in your ankles.  You have trouble with your vision. SEEK IMMEDIATE MEDICAL CARE IF:  You develop a severe headache or confusion.  You have unusual weakness, numbness, or feel faint.  You have severe chest or abdominal pain.  You vomit repeatedly.  You have trouble breathing. MAKE SURE YOU:   Understand these instructions.  Will watch your condition.  Will get help right away if you are not doing well or get worse.   This information is not intended to replace advice given to you by your health care provider. Make sure you discuss any questions you have with your health care provider.   Document Released: 08/15/2005 Document Revised: 12/30/2014 Document Reviewed: 06/07/2013 Elsevier Interactive Patient Education 2016 [employer]  

## 2016-07-01 ENCOUNTER — Ambulatory Visit: Payer: 59 | Admitting: Internal Medicine

## 2016-07-11 ENCOUNTER — Ambulatory Visit: Payer: 59 | Admitting: Podiatry

## 2016-07-13 ENCOUNTER — Encounter: Payer: Self-pay | Admitting: Podiatry

## 2016-07-13 ENCOUNTER — Ambulatory Visit (INDEPENDENT_AMBULATORY_CARE_PROVIDER_SITE_OTHER): Payer: 59 | Admitting: Podiatry

## 2016-07-13 DIAGNOSIS — Z79899 Other long term (current) drug therapy: Secondary | ICD-10-CM

## 2016-07-13 DIAGNOSIS — L603 Nail dystrophy: Secondary | ICD-10-CM | POA: Diagnosis not present

## 2016-07-13 MED ORDER — TERBINAFINE HCL 250 MG PO TABS: 250.0000 mg | ORAL_TABLET | Freq: Every day | ORAL | 0 refills | Status: DC

## 2016-07-13 NOTE — Patient Instructions (Signed)

## 2016-07-13 NOTE — Progress Notes (Signed)
She presents today for a follow-up of her pathology report which we performed a month ago for her toenails.  Objective: Pathology report was positive for fungus.  Assessment: Onychomycosis.  Plan: Currently we will start her on Lamisil therapy 250 mg capsule 1 by mouth daily 30. We also provided her with a requisition for a liver profile and CBC. She should questions or concerns she will notify us immediately. We did discuss pros and cons of medication versus laser therapy. At this point she chose oral medication due to [place] and convenience. I will follow-up with her in 1 month should her blood work back abnormal I will notify her immediately.

## 2016-07-14 LAB — HEPATIC FUNCTION PANEL
ALBUMIN: 3.5 g/dL (ref 3.5–5.5)
ALT: 7 IU/L (ref 0–32)
AST: 14 IU/L (ref 0–40)
Alkaline Phosphatase: 63 IU/L (ref 39–117)
BILIRUBIN, DIRECT: 0.08 mg/dL (ref 0.00–0.40)
Bilirubin Total: 0.3 mg/dL (ref 0.0–1.2)
Total Protein: 6.9 g/dL (ref 6.0–8.5)

## 2016-07-18 ENCOUNTER — Telehealth: Payer: Self-pay | Admitting: *Deleted

## 2016-07-18 NOTE — Telephone Encounter (Addendum)
-----   Message from Garrel Ridgel, [place] sent at 07/18/2016  6:56 AM EST ----- Blood work looks good may continue medication. Left message with Dr. Stephenie Acres orders.

## 2016-07-25 ENCOUNTER — Encounter: Payer: Self-pay | Admitting: Internal Medicine

## 2016-07-25 ENCOUNTER — Ambulatory Visit (INDEPENDENT_AMBULATORY_CARE_PROVIDER_SITE_OTHER): Payer: 59 | Admitting: Internal Medicine

## 2016-07-25 DIAGNOSIS — I10 Essential (primary) hypertension: Secondary | ICD-10-CM | POA: Diagnosis not present

## 2016-07-25 MED ORDER — AMLODIPINE BESYLATE 10 MG PO TABS: 10.0000 mg | ORAL_TABLET | Freq: Every day | ORAL | 5 refills | Status: DC

## 2016-07-25 NOTE — Progress Notes (Signed)
Subjective:    Patient ID: Margaret Carlson, female    DOB: 1976-01-21, 40 y.o.   MRN: [id number]  HPI  Pt presents to the clinic today for 3 week follow up of HTN. Her Norvasc was increased to 10 mg daily at her last visit for a BP of 142/92. She reports she is taking the medication as prescribed. She denies adverse effects. She denies dizziness, blurred vision, chest pain or shortness of breath. ECG from 02/2015 reviewed. Her BP today is 132/82.  Review of Systems      Past Medical History:  Diagnosis Date  . Hypertension     Current Outpatient Prescriptions  Medication Sig Dispense Refill  . acyclovir (ZOVIRAX) 400 MG tablet Take 400 mg by mouth daily as needed.    Marland Kitchen amLODipine (NORVASC) 10 MG tablet Take 1 tablet (10 mg total) by mouth daily. 30 tablet 0  . Calcium-Vitamin D-Vitamin K (CALCIUM + D + K PO) Take 1 capsule by mouth daily.    . cyanocobalamin 2000 MCG tablet Take 2,000 mcg by mouth daily.    . Multiple Vitamin (MULTIVITAMIN) tablet Take 1 tablet by mouth daily.    Marland Kitchen PREVIFEM 0.25-35 MG-MCG tablet Take 1 tablet by mouth daily.    . simvastatin (ZOCOR) 10 MG tablet Take 1 tablet (10 mg total) by mouth at bedtime. 30 tablet 2  . terbinafine (LAMISIL) 250 MG tablet Take 1 tablet (250 mg total) by mouth daily. 30 tablet 0   No current facility-administered medications for this visit.     Allergies  Allergen Reactions  . Ranitidine Swelling    Face and lip swelling    Family History  Problem Relation Age of Onset  . Arthritis Maternal Grandmother   . Cancer Maternal Grandmother     breast  . Hypertension Maternal Grandmother   . Breast cancer Maternal Grandmother 77    Social History   Social History  . Marital status: Single    Spouse name: N/A  . Number of children: N/A  . Years of education: N/A   Occupational History  . Not on file.   Social History Main Topics  . Smoking status: Never Smoker  . Smokeless tobacco: Never Used  . Alcohol use No   . Drug use: No  . Sexual activity: Yes   Other Topics Concern  . Not on file   Social History Narrative  . No narrative on file     Constitutional: Denies fever, malaise, fatigue, headache or abrupt weight changes.  Respiratory: Denies difficulty breathing, shortness of breath, cough or sputum production.   Cardiovascular: Denies chest pain, chest tightness, palpitations or swelling in the hands or feet.    No other specific complaints in a complete review of systems (except as listed in HPI above).  Objective:   Physical Exam   Wt 288 lb (130.6 kg)   BMI 47.93 kg/m  Wt Readings from Last 3 Encounters:  07/25/16 288 lb (130.6 kg)  06/29/16 286 lb (129.7 kg)  06/08/16 286 lb (129.7 kg)    General: Appears her stated age, obese in NAD. Cardiovascular: Normal rate and rhythm. S1,S2 noted.  No murmur, rubs or gallops noted. No JVD or BLE edema.  Pulmonary/Chest: Normal effort and positive vesicular breath sounds. No respiratory distress. No wheezes, rales or ronchi noted.    BMET    Component Value Date/Time   NA 142 06/08/2016 1528   NA 137 07/03/2013 2132   K 3.8 06/08/2016 1528  K 3.6 07/03/2013 2132   CL 103 06/08/2016 1528   CL 103 07/03/2013 2132   CO2 25 06/08/2016 1528   CO2 29 07/03/2013 2132   GLUCOSE 115 (H) 06/08/2016 1528   GLUCOSE 90 07/03/2013 2132   BUN 16 06/08/2016 1528   BUN 9 07/03/2013 2132   CREATININE 0.96 06/08/2016 1528   CREATININE 0.85 07/03/2013 2132   CALCIUM 8.8 06/08/2016 1528   CALCIUM 9.0 07/03/2013 2132   GFRNONAA 74 06/08/2016 1528   GFRNONAA >60 07/03/2013 2132   GFRAA 86 06/08/2016 1528   GFRAA >60 07/03/2013 2132    Lipid Panel     Component Value Date/Time   CHOL 222 (H) 06/08/2016 1528   TRIG 66 06/08/2016 1528   HDL 56 06/08/2016 1528   CHOLHDL 4.0 06/08/2016 1528   LDLCALC 153 (H) 06/08/2016 1528    CBC    Component Value Date/Time   WBC 4.3 06/08/2016 1528   WBC 8.6 07/03/2013 2132   RBC 4.84  06/08/2016 1528   RBC 5.47 (H) 07/03/2013 2132   HGB 13.9 07/03/2013 2132   HCT 34.8 06/08/2016 1528   PLT 305 06/08/2016 1528   MCV 72 (L) 06/08/2016 1528   MCV 73 (L) 07/03/2013 2132   MCH 24.6 (L) 06/08/2016 1528   MCH 25.3 (L) 07/03/2013 2132   MCHC 34.2 06/08/2016 1528   MCHC 34.5 07/03/2013 2132   RDW 18.1 (H) 06/08/2016 1528   RDW 19.1 (H) 07/03/2013 2132   LYMPHSABS 2.5 03/17/2015 1618   EOSABS 0.1 03/17/2015 1618   BASOSABS 0.0 03/17/2015 1618    Hgb A1C Lab Results  Component Value Date   HGBA1C 4.9 06/08/2016           Assessment & Plan:

## 2016-07-25 NOTE — Assessment & Plan Note (Signed)
Improved but still not at goal Encouraged her to work on lifestyle change, low salt diet, exercise to lose weight  RTC in 6 months to follow up HTN/HLD

## 2016-07-25 NOTE — Patient Instructions (Signed)
Hypertension Hypertension, commonly called high blood pressure, is when the force of blood pumping through your arteries is too strong. Your arteries are the blood vessels that carry blood from your heart throughout your body. A blood pressure reading consists of a higher number over a lower number, such as 110/72. The higher number (systolic) is the pressure inside your arteries when your heart pumps. The lower number (diastolic) is the pressure inside your arteries when your heart relaxes. Ideally you want your blood pressure below 120/80. Hypertension forces your heart to work harder to pump blood. Your arteries may become narrow or stiff. Having untreated or uncontrolled hypertension can cause heart attack, stroke, kidney disease, and other problems. What increases the risk? Some risk factors for high blood pressure are controllable. Others are not. Risk factors you cannot control include:  Race. You may be at higher risk if you are African American.  Age. Risk increases with age.  Gender. Men are at higher risk than women before age 45 years. After age 65, women are at higher risk than men. Risk factors you can control include:  Not getting enough exercise or physical activity.  Being overweight.  Getting too much fat, sugar, calories, or salt in your diet.  Drinking too much alcohol. What are the signs or symptoms? Hypertension does not usually cause signs or symptoms. Extremely high blood pressure (hypertensive crisis) may cause headache, anxiety, shortness of breath, and nosebleed. How is this diagnosed? To check if you have hypertension, your health care provider will measure your blood pressure while you are seated, with your arm held at the level of your heart. It should be measured at least twice using the same arm. Certain conditions can cause a difference in blood pressure between your right and left arms. A blood pressure reading that is higher than normal on one occasion does  not mean that you need treatment. If it is not clear whether you have high blood pressure, you may be asked to return on a different day to have your blood pressure checked again. Or, you may be asked to monitor your blood pressure at home for 1 or more weeks. How is this treated? Treating high blood pressure includes making lifestyle changes and possibly taking medicine. Living a healthy lifestyle can help lower high blood pressure. You may need to change some of your habits. Lifestyle changes may include:  Following the DASH diet. This diet is high in fruits, vegetables, and whole grains. It is low in salt, red meat, and added sugars.  Keep your sodium intake below 2,300 mg per day.  Getting at least 30-45 minutes of aerobic exercise at least 4 times per week.  Losing weight if necessary.  Not smoking.  Limiting alcoholic beverages.  Learning ways to reduce stress. Your health care provider may prescribe medicine if lifestyle changes are not enough to get your blood pressure under control, and if one of the following is true:  You are 18-59 years of age and your systolic blood pressure is above 140.  You are 60 years of age or older, and your systolic blood pressure is above 150.  Your diastolic blood pressure is above 90.  You have diabetes, and your systolic blood pressure is over 140 or your diastolic blood pressure is over 90.  You have kidney disease and your blood pressure is above 140/90.  You have heart disease and your blood pressure is above 140/90. Your personal target blood pressure may vary depending on your medical   conditions, your age, and other factors. Follow these instructions at home:  Have your blood pressure rechecked as directed by your health care provider.  Take medicines only as directed by your health care provider. Follow the directions carefully. Blood pressure medicines must be taken as prescribed. The medicine does not work as well when you skip  doses. Skipping doses also puts you at risk for problems.  Do not smoke.  Monitor your blood pressure at home as directed by your health care provider. Contact a health care provider if:  You think you are having a reaction to medicines taken.  You have recurrent headaches or feel dizzy.  You have swelling in your ankles.  You have trouble with your vision. Get help right away if:  You develop a severe headache or confusion.  You have unusual weakness, numbness, or feel faint.  You have severe chest or abdominal pain.  You vomit repeatedly.  You have trouble breathing. This information is not intended to replace advice given to you by your health care provider. Make sure you discuss any questions you have with your health care provider. Document Released: 08/15/2005 Document Revised: 01/21/2016 Document Reviewed: 06/07/2013 Elsevier Interactive Patient Education  2017 Elsevier Inc.  

## 2016-08-10 ENCOUNTER — Ambulatory Visit (INDEPENDENT_AMBULATORY_CARE_PROVIDER_SITE_OTHER): Payer: 59 | Admitting: Podiatry

## 2016-08-10 DIAGNOSIS — L603 Nail dystrophy: Secondary | ICD-10-CM | POA: Diagnosis not present

## 2016-08-10 DIAGNOSIS — Z79899 Other long term (current) drug therapy: Secondary | ICD-10-CM

## 2016-08-10 MED ORDER — TERBINAFINE HCL 250 MG PO TABS: 250.0000 mg | ORAL_TABLET | Freq: Every day | ORAL | 0 refills | Status: DC

## 2016-08-10 NOTE — Progress Notes (Signed)
She presents today after having been on her Lamisil for one month states that things seem to be going fine I have really no salon change in the nail plate as of yet but the medicine is not causing any problem. Denies fever chills nausea vomiting muscle aches pain's rashes or itching.  Objective: Vital signs are stable alert and oriented 3. Pulses are palpable. No change in the nail plates yet.  Assessment: [place]-term use of Lamisil for onychomycosis.  Plan: Started her on a 90 day dose of Lamisil and requested a liver profile. Should this liver profile back abnormal I'll notify her immediately otherwise I will follow up with her for months.

## 2016-08-13 LAB — HEPATIC FUNCTION PANEL
ALBUMIN: 3.7 g/dL (ref 3.5–5.5)
ALT: 6 IU/L (ref 0–32)
AST: 15 IU/L (ref 0–40)
Alkaline Phosphatase: 62 IU/L (ref 39–117)
Bilirubin, Direct: 0.07 mg/dL (ref 0.00–0.40)
TOTAL PROTEIN: 6.9 g/dL (ref 6.0–8.5)

## 2016-08-16 ENCOUNTER — Telehealth: Payer: Self-pay | Admitting: *Deleted

## 2016-08-16 NOTE — Telephone Encounter (Addendum)
-----   Message from Garrel Ridgel, [place] sent at 08/15/2016  6:40 AM EST ----- Blood work looks good and may continue medication. 08/16/2016-Left message informing pt of Dr. Stephenie Acres review of results and orders.

## 2016-09-08 ENCOUNTER — Other Ambulatory Visit (INDEPENDENT_AMBULATORY_CARE_PROVIDER_SITE_OTHER): Payer: 59

## 2016-09-08 DIAGNOSIS — E782 Mixed hyperlipidemia: Secondary | ICD-10-CM | POA: Diagnosis not present

## 2016-09-15 LAB — COMPREHENSIVE METABOLIC PANEL
A/G RATIO: 1 — AB (ref 1.2–2.2)
ALBUMIN: 3.5 g/dL (ref 3.5–5.5)
ALK PHOS: 53 IU/L (ref 39–117)
ALT: 6 IU/L (ref 0–32)
AST: 12 IU/L (ref 0–40)
BUN / CREAT RATIO: 15 (ref 9–23)
BUN: 15 mg/dL (ref 6–24)
CHLORIDE: 102 mmol/L (ref 96–106)
CO2: 22 mmol/L (ref 18–29)
Calcium: 8.8 mg/dL (ref 8.7–10.2)
Creatinine, Ser: 0.98 mg/dL (ref 0.57–1.00)
GFR calc non Af Amer: 72 mL/min/{1.73_m2} (ref >59)
GFR, EST AFRICAN AMERICAN: 83 mL/min/{1.73_m2} (ref >59)
GLUCOSE: 86 mg/dL (ref 65–99)
Globulin, Total: 3.4 g/dL (ref 1.5–4.5)
POTASSIUM: 3.9 mmol/L (ref 3.5–5.2)
Sodium: 139 mmol/L (ref 134–144)
TOTAL PROTEIN: 6.9 g/dL (ref 6.0–8.5)

## 2016-09-15 LAB — LIPID PANEL
CHOLESTEROL TOTAL: 170 mg/dL (ref 100–199)
Chol/HDL Ratio: 3 ratio units (ref 0.0–4.4)
HDL: 56 mg/dL (ref >39)
LDL Calculated: 105 mg/dL — ABNORMAL HIGH (ref 0–99)
Triglycerides: 45 mg/dL (ref 0–149)
VLDL Cholesterol Cal: 9 mg/dL (ref 5–40)

## 2016-09-23 ENCOUNTER — Encounter: Payer: Self-pay | Admitting: Internal Medicine

## 2016-09-23 DIAGNOSIS — I10 Essential (primary) hypertension: Secondary | ICD-10-CM

## 2016-09-23 MED ORDER — SIMVASTATIN 10 MG PO TABS: 10.0000 mg | ORAL_TABLET | Freq: Every day | ORAL | 1 refills | Status: DC

## 2016-09-23 MED ORDER — AMLODIPINE BESYLATE 10 MG PO TABS: 10.0000 mg | ORAL_TABLET | Freq: Every day | ORAL | 1 refills | Status: DC

## 2016-12-12 ENCOUNTER — Ambulatory Visit (INDEPENDENT_AMBULATORY_CARE_PROVIDER_SITE_OTHER): Payer: 59 | Admitting: Podiatry

## 2016-12-12 ENCOUNTER — Encounter: Payer: Self-pay | Admitting: Podiatry

## 2016-12-12 DIAGNOSIS — Z79899 Other long term (current) drug therapy: Secondary | ICD-10-CM | POA: Diagnosis not present

## 2016-12-12 DIAGNOSIS — L603 Nail dystrophy: Secondary | ICD-10-CM | POA: Diagnosis not present

## 2016-12-12 MED ORDER — TERBINAFINE HCL 250 MG PO TABS: 250.0000 mg | ORAL_TABLET | Freq: Every day | ORAL | 0 refills | Status: DC

## 2016-12-12 NOTE — Progress Notes (Signed)
She persists today for follow-up of her onychomycosis. She states that is slowly getting there she refers to 120 days of Lamisil taken for onychomycosis.  Objective: Vital signs are stable she is alert and oriented 3. Pulses are palpable. Neurologic sensory is intact. Her toenails appear to be about 75-85% grown out. She had no prostate medication.  Assessment: Onychomycosis [place]-term therapy secondary to Lamisil.  Plan: Start her on Lamisil 250 mg tablets one every other day for 2 months. Follow up with her in 3 months.

## 2017-01-24 ENCOUNTER — Encounter: Payer: Self-pay | Admitting: Internal Medicine

## 2017-01-24 ENCOUNTER — Ambulatory Visit (INDEPENDENT_AMBULATORY_CARE_PROVIDER_SITE_OTHER): Payer: 59 | Admitting: Internal Medicine

## 2017-01-24 VITALS — BP 146/90 | HR 65 | Temp 98.2°F | Wt 280.5 lb

## 2017-01-24 DIAGNOSIS — I10 Essential (primary) hypertension: Secondary | ICD-10-CM

## 2017-01-24 DIAGNOSIS — L720 Epidermal cyst: Secondary | ICD-10-CM

## 2017-01-24 DIAGNOSIS — E7800 Pure hypercholesterolemia, unspecified: Secondary | ICD-10-CM | POA: Diagnosis not present

## 2017-01-24 NOTE — Patient Instructions (Signed)
Fat and Cholesterol Restricted Diet Getting too much fat and cholesterol in your diet may cause health problems. Following this diet helps keep your fat and cholesterol at normal levels. This can keep you from getting sick. What types of fat should I choose?  Choose monosaturated and polyunsaturated fats. These are found in foods such as olive oil, canola oil, flaxseeds, walnuts, almonds, and seeds.  Eat more omega-3 fats. Good choices include salmon, mackerel, sardines, tuna, flaxseed oil, and ground flaxseeds.  Limit saturated fats. These are in animal products such as meats, butter, and cream. They can also be in plant products such as palm oil, palm kernel oil, and coconut oil.  Avoid foods with partially hydrogenated oils in them. These contain trans fats. Examples of foods that have trans fats are stick margarine, some tub margarines, cookies, crackers, and other baked goods. What general guidelines do I need to follow?  Check food labels. Look for the words "trans fat" and "saturated fat."  When preparing a meal:  Fill half of your plate with vegetables and green salads.  Fill one fourth of your plate with whole grains. Look for the word "whole" as the first word in the ingredient list.  Fill one fourth of your plate with lean protein foods.  Eat more foods that have fiber, like apples, carrots, beans, peas, and barley.  Eat more home-cooked foods. Eat less at restaurants and buffets.  Limit or avoid alcohol.  Limit foods high in starch and sugar.  Limit fried foods.  Cook foods without frying them. Baking, boiling, grilling, and broiling are all great options.  Lose weight if you are overweight. Losing even a small amount of weight can help your overall health. It can also help prevent diseases such as diabetes and heart disease. What foods can I eat? Grains  Whole grains, such as whole wheat or whole grain breads, crackers, cereals, and pasta. Unsweetened oatmeal,  bulgur, barley, quinoa, or brown rice. Corn or whole wheat flour tortillas. Vegetables  Fresh or frozen vegetables (raw, steamed, roasted, or grilled). Green salads. Fruits  All fresh, canned (in natural juice), or frozen fruits. Meat and Other Protein Products  Ground beef (85% or leaner), grass-fed beef, or beef trimmed of fat. Skinless chicken or turkey. Ground chicken or turkey. Pork trimmed of fat. All fish and seafood. Eggs. Dried beans, peas, or lentils. Unsalted nuts or seeds. Unsalted canned or dry beans. Dairy  Low-fat dairy products, such as skim or 1% milk, 2% or reduced-fat cheeses, low-fat ricotta or cottage cheese, or plain low-fat yogurt. Fats and Oils  Tub margarines without trans fats. Light or reduced-fat mayonnaise and salad dressings. Avocado. Olive, canola, sesame, or safflower oils. Natural peanut or almond butter (choose ones without added sugar and oil). The items listed above may not be a complete list of recommended foods or beverages. Contact your dietitian for more options.  What foods are not recommended? Grains  White bread. White pasta. White rice. Cornbread. Bagels, pastries, and croissants. Crackers that contain trans fat. Vegetables  White potatoes. Corn. Creamed or fried vegetables. Vegetables in a cheese sauce. Fruits  Dried fruits. Canned fruit in light or heavy syrup. Fruit juice. Meat and Other Protein Products  Fatty cuts of meat. Ribs, chicken wings, bacon, sausage, bologna, salami, chitterlings, fatback, hot dogs, bratwurst, and packaged luncheon meats. Liver and organ meats. Dairy  Whole or 2% milk, cream, half-and-half, and cream cheese. Whole milk cheeses. Whole-fat or sweetened yogurt. Full-fat cheeses. Nondairy creamers and whipped   toppings. Processed cheese, cheese spreads, or cheese curds. Sweets and Desserts  Corn syrup, sugars, honey, and molasses. Candy. Jam and jelly. Syrup. Sweetened cereals. Cookies, pies, cakes, donuts, muffins, and ice  cream. Fats and Oils  Butter, stick margarine, lard, shortening, ghee, or bacon fat. Coconut, palm kernel, or palm oils. Beverages  Alcohol. Sweetened drinks (such as sodas, lemonade, and fruit drinks or punches). The items listed above may not be a complete list of foods and beverages to avoid. Contact your dietitian for more information.  This information is not intended to replace advice given to you by your health care provider. Make sure you discuss any questions you have with your health care provider. Document Released: 02/14/2012 Document Revised: 04/21/2016 Document Reviewed: 11/14/2013 Elsevier Interactive Patient Education  2017 Elsevier Inc.  

## 2017-01-24 NOTE — Assessment & Plan Note (Signed)
CMET and lipid profile today Encouraged her to consume a low fat diet Continue Zocor, will adjust if needed based on labs

## 2017-01-24 NOTE — Assessment & Plan Note (Signed)
Elevated because she hasn't been taking her medication Advised her to start taking it daily  RTC in 2 weeks for BP check

## 2017-01-24 NOTE — Addendum Note (Signed)
Addended by: Ellamae Sia on: 01/24/2017 04:16 PM   Modules accepted: Orders

## 2017-01-24 NOTE — Progress Notes (Signed)
Subjective:    Patient ID: Margaret Carlson, female    DOB: 02-06-1976, 41 y.o.   MRN: [id number]  HPI  Pt presents to the clinic today for follow up of chronic conditions.  HTN: Her BP today is 146/90. She is taking Amlodipine daily as prescribed but reports she forgot to take it the past 2 days. ECG from 02/2015 reviewed.  HLD: Her last LDL was 105, 08/2016. She is taking Zocor as prescribed. She denies myalgias. She tries to consume a low fat diet.  She is requesting removal of a cyst on her right temple.  Review of Systems      Past Medical History:  Diagnosis Date  . Hypertension     Current Outpatient Prescriptions  Medication Sig Dispense Refill  . acyclovir (ZOVIRAX) 400 MG tablet Take 400 mg by mouth daily as needed.    Marland Kitchen amLODipine (NORVASC) 10 MG tablet Take 1 tablet (10 mg total) by mouth daily. 90 tablet 1  . Calcium-Vitamin D-Vitamin K (CALCIUM + D + K PO) Take 1 capsule by mouth daily.    . cyanocobalamin 2000 MCG tablet Take 2,000 mcg by mouth daily.    . Multiple Vitamin (MULTIVITAMIN) tablet Take 1 tablet by mouth daily.    Marland Kitchen PREVIFEM 0.25-35 MG-MCG tablet Take 1 tablet by mouth daily.    . simvastatin (ZOCOR) 10 MG tablet Take 1 tablet (10 mg total) by mouth at bedtime. 90 tablet 1  . terbinafine (LAMISIL) 250 MG tablet Take 1 tablet (250 mg total) by mouth daily. 30 tablet 0   No current facility-administered medications for this visit.     Allergies  Allergen Reactions  . Ranitidine Swelling    Face and lip swelling    Family History  Problem Relation Age of Onset  . Arthritis Maternal Grandmother   . Cancer Maternal Grandmother        breast  . Hypertension Maternal Grandmother   . Breast cancer Maternal Grandmother 77    Social History   Social History  . Marital status: Single    Spouse name: N/A  . Number of children: N/A  . Years of education: N/A   Occupational History  . Not on file.   Social History Main Topics  . Smoking  status: Never Smoker  . Smokeless tobacco: Never Used  . Alcohol use No  . Drug use: No  . Sexual activity: Yes   Other Topics Concern  . Not on file   Social History Narrative  . No narrative on file     Constitutional: Denies fever, malaise, fatigue, headache or abrupt weight changes.  Respiratory: Denies difficulty breathing, shortness of breath, cough or sputum production.   Cardiovascular: Denies chest pain, chest tightness, palpitations or swelling in the hands or feet.   Skin: Pt reports cyst of skin. Denies redness, rashes, or ulcercations.  Neurological: Denies dizziness, difficulty with memory, difficulty with speech or problems with balance and coordination.    No other specific complaints in a complete review of systems (except as listed in HPI above).  Objective:   Physical Exam  BP (!) 146/90   Pulse 65   Temp 98.2 F (36.8 C) (Oral)   Wt 280 lb 8 oz (127.2 kg)   SpO2 98%   BMI 46.68 kg/m  Wt Readings from Last 3 Encounters:  01/24/17 280 lb 8 oz (127.2 kg)  07/25/16 288 lb (130.6 kg)  06/29/16 286 lb (129.7 kg)    General: Appears her stated  age, obese in NAD. Skin: 0.5 cm round, raised cyst noted of right temple. Cardiovascular: Normal rate and rhythm. S1,S2 noted.  No murmur, rubs or gallops noted. No JVD or BLE edema. No carotid bruits noted. Pulmonary/Chest: Normal effort and positive vesicular breath sounds. No respiratory distress. No wheezes, rales or ronchi noted.  Neurological: Alert and oriented.   BMET    Component Value Date/Time   NA 139 09/08/2016 0801   NA 137 07/03/2013 2132   K 3.9 09/08/2016 0801   K 3.6 07/03/2013 2132   CL 102 09/08/2016 0801   CL 103 07/03/2013 2132   CO2 22 09/08/2016 0801   CO2 29 07/03/2013 2132   GLUCOSE 86 09/08/2016 0801   GLUCOSE 90 07/03/2013 2132   BUN 15 09/08/2016 0801   BUN 9 07/03/2013 2132   CREATININE 0.98 09/08/2016 0801   CREATININE 0.85 07/03/2013 2132   CALCIUM 8.8 09/08/2016 0801    CALCIUM 9.0 07/03/2013 2132   GFRNONAA 72 09/08/2016 0801   GFRNONAA >60 07/03/2013 2132   GFRAA 83 09/08/2016 0801   GFRAA >60 07/03/2013 2132    Lipid Panel     Component Value Date/Time   CHOL 170 09/08/2016 0801   TRIG 45 09/08/2016 0801   HDL 56 09/08/2016 0801   CHOLHDL 3.0 09/08/2016 0801   LDLCALC 105 (H) 09/08/2016 0801    CBC    Component Value Date/Time   WBC 4.3 06/08/2016 1528   WBC 8.6 07/03/2013 2132   RBC 4.84 06/08/2016 1528   RBC 5.47 (H) 07/03/2013 2132   HGB 13.9 07/03/2013 2132   HCT 34.8 06/08/2016 1528   PLT 305 06/08/2016 1528   MCV 72 (L) 06/08/2016 1528   MCV 73 (L) 07/03/2013 2132   MCH 24.6 (L) 06/08/2016 1528   MCH 25.3 (L) 07/03/2013 2132   MCHC 34.2 06/08/2016 1528   MCHC 34.5 07/03/2013 2132   RDW 18.1 (H) 06/08/2016 1528   RDW 19.1 (H) 07/03/2013 2132   LYMPHSABS 2.5 03/17/2015 1618   EOSABS 0.1 03/17/2015 1618   BASOSABS 0.0 03/17/2015 1618    Hgb A1C Lab Results  Component Value Date   HGBA1C 4.9 06/08/2016            Assessment & Plan:   Cyst of Temple:  Referral placed to dermatology for excision  RTC in 2 weeks for BP check Webb Silversmith, NP

## 2017-01-25 ENCOUNTER — Encounter: Payer: Self-pay | Admitting: Internal Medicine

## 2017-01-25 LAB — COMPREHENSIVE METABOLIC PANEL
ALT: 7 IU/L (ref 0–32)
AST: 12 IU/L (ref 0–40)
Albumin/Globulin Ratio: 1.1 — ABNORMAL LOW (ref 1.2–2.2)
Albumin: 3.5 g/dL (ref 3.5–5.5)
Alkaline Phosphatase: 54 IU/L (ref 39–117)
BUN/Creatinine Ratio: 14 (ref 9–23)
BUN: 15 mg/dL (ref 6–24)
Bilirubin Total: <0.2 mg/dL (ref 0.0–1.2)
CALCIUM: 8.7 mg/dL (ref 8.7–10.2)
CO2: 26 mmol/L (ref 18–29)
CREATININE: 1.05 mg/dL — AB (ref 0.57–1.00)
Chloride: 100 mmol/L (ref 96–106)
GFR calc Af Amer: 76 mL/min/{1.73_m2} (ref >59)
GFR, EST NON AFRICAN AMERICAN: 66 mL/min/{1.73_m2} (ref >59)
Globulin, Total: 3.2 g/dL (ref 1.5–4.5)
Glucose: 81 mg/dL (ref 65–99)
POTASSIUM: 4.4 mmol/L (ref 3.5–5.2)
Sodium: 137 mmol/L (ref 134–144)
Total Protein: 6.7 g/dL (ref 6.0–8.5)

## 2017-01-25 LAB — LIPID PANEL
CHOL/HDL RATIO: 3.4 ratio (ref 0.0–4.4)
CHOLESTEROL TOTAL: 203 mg/dL — AB (ref 100–199)
HDL: 60 mg/dL (ref >39)
LDL CALC: 133 mg/dL — AB (ref 0–99)
TRIGLYCERIDES: 48 mg/dL (ref 0–149)
VLDL Cholesterol Cal: 10 mg/dL (ref 5–40)

## 2017-01-26 MED ORDER — SIMVASTATIN 20 MG PO TABS: 20.0000 mg | ORAL_TABLET | Freq: Every day | ORAL | 0 refills | Status: DC

## 2017-01-26 NOTE — Addendum Note (Signed)
Addended by: Lurlean Nanny on: 01/26/2017 11:37 AM   Modules accepted: Orders

## 2017-02-13 ENCOUNTER — Ambulatory Visit: Payer: 59 | Admitting: Internal Medicine

## 2017-02-16 ENCOUNTER — Encounter: Payer: Self-pay | Admitting: Internal Medicine

## 2017-02-16 ENCOUNTER — Ambulatory Visit (INDEPENDENT_AMBULATORY_CARE_PROVIDER_SITE_OTHER): Payer: 59 | Admitting: Internal Medicine

## 2017-02-16 VITALS — BP 130/88 | HR 62 | Temp 98.1°F | Wt 279.5 lb

## 2017-02-16 DIAGNOSIS — I10 Essential (primary) hypertension: Secondary | ICD-10-CM | POA: Diagnosis not present

## 2017-02-16 NOTE — Progress Notes (Signed)
Subjective:    Patient ID: Margaret Carlson, female    DOB: Dec 24, 1975, 41 y.o.   MRN: [id number]  HPI  Pt presents to the clinic today for 2 week follow up of HTN. At her last visit, she had reported that she had not been taking her BP medication. She was advised to start taking her Amlodipine daily. She reports she has been taking the medication as prescribed. Her BP today is 130/88. ECG from 02/2015 reviewed.  Review of Systems      Past Medical History:  Diagnosis Date  . Hypertension     Current Outpatient Prescriptions  Medication Sig Dispense Refill  . acyclovir (ZOVIRAX) 400 MG tablet Take 400 mg by mouth daily as needed.    Marland Kitchen amLODipine (NORVASC) 10 MG tablet Take 1 tablet (10 mg total) by mouth daily. 90 tablet 1  . Calcium-Vitamin D-Vitamin K (CALCIUM + D + K PO) Take 1 capsule by mouth daily.    . cyanocobalamin 2000 MCG tablet Take 2,000 mcg by mouth daily.    . Multiple Vitamin (MULTIVITAMIN) tablet Take 1 tablet by mouth daily.    Marland Kitchen PREVIFEM 0.25-35 MG-MCG tablet Take 1 tablet by mouth daily.    . simvastatin (ZOCOR) 20 MG tablet Take 1 tablet (20 mg total) by mouth at bedtime. 90 tablet 0  . terbinafine (LAMISIL) 250 MG tablet Every other day     No current facility-administered medications for this visit.     Allergies  Allergen Reactions  . Ranitidine Swelling    Face and lip swelling    Family History  Problem Relation Age of Onset  . Arthritis Maternal Grandmother   . Cancer Maternal Grandmother        breast  . Hypertension Maternal Grandmother   . Breast cancer Maternal Grandmother 77    Social History   Social History  . Marital status: Single    Spouse name: N/A  . Number of children: N/A  . Years of education: N/A   Occupational History  . Not on file.   Social History Main Topics  . Smoking status: Never Smoker  . Smokeless tobacco: Never Used  . Alcohol use No  . Drug use: No  . Sexual activity: Yes   Other Topics Concern  .  Not on file   Social History Narrative  . No narrative on file     Constitutional: Denies fever, malaise, fatigue, headache or abrupt weight changes.  Respiratory: Denies difficulty breathing, shortness of breath, cough or sputum production.   Cardiovascular: Denies chest pain, chest tightness, palpitations or swelling in the hands or feet.   No other specific complaints in a complete review of systems (except as listed in HPI above).  Objective:   Physical Exam  BP 130/88   Pulse 62   Temp 98.1 F (36.7 C) (Oral)   Wt 279 lb 8 oz (126.8 kg)   SpO2 100%   BMI 46.51 kg/m  Wt Readings from Last 3 Encounters:  02/16/17 279 lb 8 oz (126.8 kg)  01/24/17 280 lb 8 oz (127.2 kg)  07/25/16 288 lb (130.6 kg)    General: Appears her stated age, obese in NAD. Cardiovascular: Normal rate and rhythm. S1,S2 noted.  No murmur, rubs or gallops noted. Pulmonary/Chest: Normal effort and positive vesicular breath sounds. No respiratory distress. No wheezes, rales or ronchi noted.  Neurological: Alert and oriented.   BMET    Component Value Date/Time   NA 137 01/24/2017 1616  NA 137 07/03/2013 2132   K 4.4 01/24/2017 1616   K 3.6 07/03/2013 2132   CL 100 01/24/2017 1616   CL 103 07/03/2013 2132   CO2 26 01/24/2017 1616   CO2 29 07/03/2013 2132   GLUCOSE 81 01/24/2017 1616   GLUCOSE 90 07/03/2013 2132   BUN 15 01/24/2017 1616   BUN 9 07/03/2013 2132   CREATININE 1.05 (H) 01/24/2017 1616   CREATININE 0.85 07/03/2013 2132   CALCIUM 8.7 01/24/2017 1616   CALCIUM 9.0 07/03/2013 2132   GFRNONAA 66 01/24/2017 1616   GFRNONAA >60 07/03/2013 2132   GFRAA 76 01/24/2017 1616   GFRAA >60 07/03/2013 2132    Lipid Panel     Component Value Date/Time   CHOL 203 (H) 01/24/2017 1616   TRIG 48 01/24/2017 1616   HDL 60 01/24/2017 1616   CHOLHDL 3.4 01/24/2017 1616   LDLCALC 133 (H) 01/24/2017 1616    CBC    Component Value Date/Time   WBC 4.3 06/08/2016 1528   WBC 8.6 07/03/2013  2132   RBC 4.84 06/08/2016 1528   RBC 5.47 (H) 07/03/2013 2132   HGB 11.9 06/08/2016 1528   HCT 34.8 06/08/2016 1528   PLT 305 06/08/2016 1528   MCV 72 (L) 06/08/2016 1528   MCV 73 (L) 07/03/2013 2132   MCH 24.6 (L) 06/08/2016 1528   MCH 25.3 (L) 07/03/2013 2132   MCHC 34.2 06/08/2016 1528   MCHC 34.5 07/03/2013 2132   RDW 18.1 (H) 06/08/2016 1528   RDW 19.1 (H) 07/03/2013 2132   LYMPHSABS 2.5 03/17/2015 1618   EOSABS 0.1 03/17/2015 1618   BASOSABS 0.0 03/17/2015 1618    Hgb A1C Lab Results  Component Value Date   HGBA1C 4.9 06/08/2016            Assessment & Plan:   HTN:  Improved with Amlodipine Monitor for now  Return precautions discussed Webb Silversmith, NP

## 2017-02-16 NOTE — Patient Instructions (Signed)

## 2017-02-21 ENCOUNTER — Other Ambulatory Visit: Payer: Self-pay | Admitting: Internal Medicine

## 2017-02-21 DIAGNOSIS — I10 Essential (primary) hypertension: Secondary | ICD-10-CM

## 2017-03-13 ENCOUNTER — Encounter: Payer: Self-pay | Admitting: Podiatry

## 2017-03-13 ENCOUNTER — Ambulatory Visit (INDEPENDENT_AMBULATORY_CARE_PROVIDER_SITE_OTHER): Payer: 59 | Admitting: Podiatry

## 2017-03-13 DIAGNOSIS — L603 Nail dystrophy: Secondary | ICD-10-CM | POA: Diagnosis not present

## 2017-03-13 DIAGNOSIS — Z79899 Other long term (current) drug therapy: Secondary | ICD-10-CM | POA: Diagnosis not present

## 2017-03-13 MED ORDER — TERBINAFINE HCL 250 MG PO TABS: 250.0000 mg | ORAL_TABLET | Freq: Every day | ORAL | 0 refills | Status: DC

## 2017-03-13 NOTE — Progress Notes (Signed)
She presents today for follow-up of her Lamisil therapy for onychomycosis. She states that she's having no side effects taking the medication. She has finished her every other day dosing for the past 2 months and states that her toenails are nearly grown out. She still has some tenderness to the hallux nail plate left.  Objective: Vital signs are stable she's alert and oriented 3 nails appear to be growing out very nicely almost 100% clear with just a small area of thickening to the nail of chief complaint.  Assessment: Onychomycosis is resolving with the use of Lamisil.  Plan: I encouraged her to use Lamisil for another 2 months one tablet every other day and that prescription was sent to her pharmacy. I will follow-up with her in 3 months

## 2017-05-11 ENCOUNTER — Encounter: Payer: Self-pay | Admitting: Internal Medicine

## 2017-05-24 ENCOUNTER — Telehealth: Payer: Self-pay

## 2017-05-24 DIAGNOSIS — R928 Other abnormal and inconclusive findings on diagnostic imaging of breast: Secondary | ICD-10-CM

## 2017-05-24 NOTE — Telephone Encounter (Signed)
RECOMMENDATION: Bilateral diagnostic mammography with possible right breast ultrasound in 12 months which will demonstrate 2 years of stability of the probably benign right breast mass.  Per the last Mamm from 2017

## 2017-05-24 NOTE — Telephone Encounter (Signed)
Why does she need a diagnostic mammogram?

## 2017-05-24 NOTE — Telephone Encounter (Signed)
Pt left v/m requesting referral to [facility] for diagnostic mammogram. Last mammogram 06/23/2016.

## 2017-05-25 NOTE — Addendum Note (Signed)
Addended by: Jearld Fenton on: 05/25/2017 08:02 AM   Modules accepted: Orders

## 2017-05-25 NOTE — Telephone Encounter (Signed)
ordered

## 2017-05-26 ENCOUNTER — Telehealth: Payer: Self-pay | Admitting: Internal Medicine

## 2017-05-26 DIAGNOSIS — R928 Other abnormal and inconclusive findings on diagnostic imaging of breast: Secondary | ICD-10-CM

## 2017-05-26 NOTE — Addendum Note (Signed)
Addended by: Lurlean Nanny on: 05/26/2017 04:27 PM   Modules accepted: Orders

## 2017-05-26 NOTE — Telephone Encounter (Signed)
Pt came in today stating she needs a referral for diagnostic mammogram before [place] will schedule appointment  Pt will need diagnostic mammogram and ultra sound for left and right breast  Best number [phone]

## 2017-05-26 NOTE — Addendum Note (Signed)
Addended by: Jearld Fenton on: 05/26/2017 12:45 PM   Modules accepted: Orders

## 2017-05-26 NOTE — Telephone Encounter (Signed)
Orders have been placed.

## 2017-05-31 NOTE — Addendum Note (Signed)
Addended by: Jacqualin Combes on: 05/31/2017 08:27 AM   Modules accepted: Orders

## 2017-06-12 ENCOUNTER — Ambulatory Visit (INDEPENDENT_AMBULATORY_CARE_PROVIDER_SITE_OTHER): Payer: 59 | Admitting: Internal Medicine

## 2017-06-12 ENCOUNTER — Encounter: Payer: Self-pay | Admitting: Internal Medicine

## 2017-06-12 VITALS — BP 130/92 | HR 69 | Temp 97.9°F | Ht 65.0 in | Wt 280.0 lb

## 2017-06-12 DIAGNOSIS — Z0289 Encounter for other administrative examinations: Secondary | ICD-10-CM

## 2017-06-12 DIAGNOSIS — Z113 Encounter for screening for infections with a predominantly sexual mode of transmission: Secondary | ICD-10-CM

## 2017-06-12 DIAGNOSIS — E782 Mixed hyperlipidemia: Secondary | ICD-10-CM

## 2017-06-12 DIAGNOSIS — Z23 Encounter for immunization: Secondary | ICD-10-CM | POA: Diagnosis not present

## 2017-06-12 DIAGNOSIS — Z0000 Encounter for general adult medical examination without abnormal findings: Secondary | ICD-10-CM

## 2017-06-12 DIAGNOSIS — I10 Essential (primary) hypertension: Secondary | ICD-10-CM

## 2017-06-12 NOTE — Addendum Note (Signed)
Addended by: Lurlean Nanny on: 06/12/2017 12:44 PM   Modules accepted: Orders

## 2017-06-12 NOTE — Assessment & Plan Note (Signed)
Controlled on Amlodipine CMET today Discussed exercise for weight loss, DASH diet

## 2017-06-12 NOTE — Assessment & Plan Note (Signed)
CMET and lipid profile today Encouraged her to consume a low fat diet Continue Simvastatin daily as prescribed

## 2017-06-12 NOTE — Patient Instructions (Signed)
Health Maintenance, Female Adopting a healthy lifestyle and getting preventive care can go a [place] way to promote health and wellness. Talk with your health care provider about what schedule of regular examinations is right for you. This is a good chance for you to check in with your provider about disease prevention and staying healthy. In between checkups, there are plenty of things you can do on your own. Experts have done a lot of research about which lifestyle changes and preventive measures are most likely to keep you healthy. Ask your health care provider for more information. Weight and diet Eat a healthy diet  Be sure to include plenty of vegetables, fruits, low-fat dairy products, and lean protein.  Do not eat a lot of foods high in solid fats, added sugars, or salt.  Get regular exercise. This is one of the most important things you can do for your health. ? Most adults should exercise for at least 150 minutes each week. The exercise should increase your heart rate and make you sweat (moderate-intensity exercise). ? Most adults should also do strengthening exercises at least twice a week. This is in addition to the moderate-intensity exercise.  Maintain a healthy weight  Body mass index (BMI) is a measurement that can be used to identify possible weight problems. It estimates body fat based on height and weight. Your health care provider can help determine your BMI and help you achieve or maintain a healthy weight.  For females 20 years of age and older: ? A BMI below 18.5 is considered underweight. ? A BMI of 18.5 to 24.9 is normal. ? A BMI of 25 to 29.9 is considered overweight. ? A BMI of 30 and above is considered obese.  Watch levels of cholesterol and blood lipids  You should start having your blood tested for lipids and cholesterol at 41 years of age, then have this test every 5 years.  You may need to have your cholesterol levels checked more often if: ? Your lipid or  cholesterol levels are high. ? You are older than 41 years of age. ? You are at high risk for heart disease.  Cancer screening Lung Cancer  Lung cancer screening is recommended for adults 55-80 years old who are at high risk for lung cancer because of a history of smoking.  A yearly low-dose CT scan of the lungs is recommended for people who: ? Currently smoke. ? Have quit within the past 15 years. ? Have at least a 30-pack-year history of smoking. A pack year is smoking an average of one pack of cigarettes a day for 1 year.  Yearly screening should continue until it has been 15 years since you quit.  Yearly screening should stop if you develop a health problem that would prevent you from having lung cancer treatment.  Breast Cancer  Practice breast self-awareness. This means understanding how your breasts normally appear and feel.  It also means doing regular breast self-exams. Let your health care provider know about any changes, no matter how small.  If you are in your 20s or 30s, you should have a clinical breast exam (CBE) by a health care provider every 1-3 years as part of a regular health exam.  If you are 40 or older, have a CBE every year. Also consider having a breast X-ray (mammogram) every year.  If you have a family history of breast cancer, talk to your health care provider about genetic screening.  If you are at high risk   for breast cancer, talk to your health care provider about having an MRI and a mammogram every year.  Breast cancer gene (BRCA) assessment is recommended for women who have family members with BRCA-related cancers. BRCA-related cancers include: ? Breast. ? Ovarian. ? Tubal. ? Peritoneal cancers.  Results of the assessment will determine the need for genetic counseling and BRCA1 and BRCA2 testing.  Cervical Cancer Your health care provider may recommend that you be screened regularly for cancer of the pelvic organs (ovaries, uterus, and  vagina). This screening involves a pelvic examination, including checking for microscopic changes to the surface of your cervix (Pap test). You may be encouraged to have this screening done every 3 years, beginning at age 22.  For women ages 56-65, health care providers may recommend pelvic exams and Pap testing every 3 years, or they may recommend the Pap and pelvic exam, combined with testing for human papilloma virus (HPV), every 5 years. Some types of HPV increase your risk of cervical cancer. Testing for HPV may also be done on women of any age with unclear Pap test results.  Other health care providers may not recommend any screening for nonpregnant women who are considered low risk for pelvic cancer and who do not have symptoms. Ask your health care provider if a screening pelvic exam is right for you.  If you have had past treatment for cervical cancer or a condition that could lead to cancer, you need Pap tests and screening for cancer for at least 20 years after your treatment. If Pap tests have been discontinued, your risk factors (such as having a new sexual partner) need to be reassessed to determine if screening should resume. Some women have medical problems that increase the chance of getting cervical cancer. In these cases, your health care provider may recommend more frequent screening and Pap tests.  Colorectal Cancer  This type of cancer can be detected and often prevented.  Routine colorectal cancer screening usually begins at 41 years of age and continues through 41 years of age.  Your health care provider may recommend screening at an earlier age if you have risk factors for colon cancer.  Your health care provider may also recommend using home test kits to check for hidden blood in the stool.  A small camera at the end of a tube can be used to examine your colon directly (sigmoidoscopy or colonoscopy). This is done to check for the earliest forms of colorectal  cancer.  Routine screening usually begins at age 33.  Direct examination of the colon should be repeated every 5-10 years through 41 years of age. However, you may need to be screened more often if early forms of precancerous polyps or small growths are found.  Skin Cancer  Check your skin from head to toe regularly.  Tell your health care provider about any new moles or changes in moles, especially if there is a change in a mole's shape or color.  Also tell your health care provider if you have a mole that is larger than the size of a pencil eraser.  Always use sunscreen. Apply sunscreen liberally and repeatedly throughout the day.  Protect yourself by wearing [place] sleeves, pants, a wide-brimmed hat, and sunglasses whenever you are outside.  Heart disease, diabetes, and high blood pressure  High blood pressure causes heart disease and increases the risk of stroke. High blood pressure is more likely to develop in: ? People who have blood pressure in the high end of  the normal range (130-139/85-89 mm Hg). ? People who are overweight or obese. ? People who are African American.  If you are 21-29 years of age, have your blood pressure checked every 3-5 years. If you are 3 years of age or older, have your blood pressure checked every year. You should have your blood pressure measured twice-once when you are at a hospital or clinic, and once when you are not at a hospital or clinic. Record the average of the two measurements. To check your blood pressure when you are not at a hospital or clinic, you can use: ? An automated blood pressure machine at a pharmacy. ? A home blood pressure monitor.  If you are between 17 years and 37 years old, ask your health care provider if you should take aspirin to prevent strokes.  Have regular diabetes screenings. This involves taking a blood sample to check your fasting blood sugar level. ? If you are at a normal weight and have a low risk for diabetes,  have this test once every three years after 41 years of age. ? If you are overweight and have a high risk for diabetes, consider being tested at a younger age or more often. Preventing infection Hepatitis B  If you have a higher risk for hepatitis B, you should be screened for this virus. You are considered at high risk for hepatitis B if: ? You were born in a country where hepatitis B is common. Ask your health care provider which countries are considered high risk. ? Your parents were born in a high-risk country, and you have not been immunized against hepatitis B (hepatitis B vaccine). ? You have HIV or AIDS. ? You use needles to inject street drugs. ? You live with someone who has hepatitis B. ? You have had sex with someone who has hepatitis B. ? You get hemodialysis treatment. ? You take certain medicines for conditions, including cancer, organ transplantation, and autoimmune conditions.  Hepatitis C  Blood testing is recommended for: ? Everyone born from [birth year] through 1965. ? Anyone with known risk factors for hepatitis C.  Sexually transmitted infections (STIs)  You should be screened for sexually transmitted infections (STIs) including gonorrhea and chlamydia if: ? You are sexually active and are younger than 41 years of age. ? You are older than 41 years of age and your health care provider tells you that you are at risk for this type of infection. ? Your sexual activity has changed since you were last screened and you are at an increased risk for chlamydia or gonorrhea. Ask your health care provider if you are at risk.  If you do not have HIV, but are at risk, it may be recommended that you take a prescription medicine daily to prevent HIV infection. This is called pre-exposure prophylaxis (PrEP). You are considered at risk if: ? You are sexually active and do not regularly use condoms or know the HIV status of your partner(s). ? You take drugs by injection. ? You are  sexually active with a partner who has HIV.  Talk with your health care provider about whether you are at high risk of being infected with HIV. If you choose to begin PrEP, you should first be tested for HIV. You should then be tested every 3 months for as [place] as you are taking PrEP. Pregnancy  If you are premenopausal and you may become pregnant, ask your health care provider about preconception counseling.  If you may become  pregnant, take 400 to 800 micrograms (mcg) of folic acid every day.  If you want to prevent pregnancy, talk to your health care provider about birth control (contraception). Osteoporosis and menopause  Osteoporosis is a disease in which the bones lose minerals and strength with aging. This can result in serious bone fractures. Your risk for osteoporosis can be identified using a bone density scan.  If you are 13 years of age or older, or if you are at risk for osteoporosis and fractures, ask your health care provider if you should be screened.  Ask your health care provider whether you should take a calcium or vitamin D supplement to lower your risk for osteoporosis.  Menopause may have certain physical symptoms and risks.  Hormone replacement therapy may reduce some of these symptoms and risks. Talk to your health care provider about whether hormone replacement therapy is right for you. Follow these instructions at home:  Schedule regular health, dental, and eye exams.  Stay current with your immunizations.  Do not use any tobacco products including cigarettes, chewing tobacco, or electronic cigarettes.  If you are pregnant, do not drink alcohol.  If you are breastfeeding, limit how much and how often you drink alcohol.  Limit alcohol intake to no more than 1 drink per day for nonpregnant women. One drink equals 12 ounces of beer, 5 ounces of wine, or 1 ounces of hard liquor.  Do not use street drugs.  Do not share needles.  Ask your health care  provider for help if you need support or information about quitting drugs.  Tell your health care provider if you often feel depressed.  Tell your health care provider if you have ever been abused or do not feel safe at home. This information is not intended to replace advice given to you by your health care provider. Make sure you discuss any questions you have with your health care provider. Document Released: 02/28/2011 Document Revised: 01/21/2016 Document Reviewed: 05/19/2015 Elsevier Interactive Patient Education  [employer].

## 2017-06-12 NOTE — Progress Notes (Signed)
Subjective:    Patient ID: Margaret Carlson, female    DOB: 10-Jul-1976, 41 y.o.   MRN: [id number]  HPI  Pt presents to the clinic today for her annual exam. She has a form that she needs filled out for insurance purposes. She is also due to follow up chronic conditions.  HTN: Her BP today is 130/92. She is taking Amlodipine daily as prescribed. ECG from 02/2015 reviewed.  HLD: Her last LDL was 133, 12/2014. She is taking Simvastatin daily as prescribed. She tries to consume a low fat diet.   Flu: 05/2016 Tetanus: 02/2015 Pap Smear: 04/2016, [facility] Mammogram: 05/2016, scheduled 06/27/2017 Vision Screening: annually Dentist: biannually  Diet: She does eat meat. She consumes fruits and veggies daily. She occasaionally eats fried foods. She drinks mostly water. Exercise: She does boot camp, dance classes 3-4 days per week.  Review of Systems      Past Medical History:  Diagnosis Date  . Hypertension     Current Outpatient Prescriptions  Medication Sig Dispense Refill  . acyclovir (ZOVIRAX) 400 MG tablet Take 400 mg by mouth daily as needed.    Marland Kitchen amLODipine (NORVASC) 10 MG tablet TAKE 1 TABLET BY MOUTH  DAILY 90 tablet 0  . Calcium-Vitamin D-Vitamin K (CALCIUM + D + K PO) Take 1 capsule by mouth daily.    . cyanocobalamin 2000 MCG tablet Take 2,000 mcg by mouth daily.    . Multiple Vitamin (MULTIVITAMIN) tablet Take 1 tablet by mouth daily.    Marland Kitchen PREVIFEM 0.25-35 MG-MCG tablet Take 1 tablet by mouth daily.    . simvastatin (ZOCOR) 20 MG tablet Take 1 tablet (20 mg total) by mouth at bedtime. 90 tablet 0  . terbinafine (LAMISIL) 250 MG tablet Every other day    . terbinafine (LAMISIL) 250 MG tablet Take 1 tablet (250 mg total) by mouth daily. 30 tablet 0   No current facility-administered medications for this visit.     Allergies  Allergen Reactions  . Ranitidine Swelling    Face and lip swelling    Family History  Problem Relation Age of Onset  . Arthritis Maternal  Grandmother   . Cancer Maternal Grandmother        breast  . Hypertension Maternal Grandmother   . Breast cancer Maternal Grandmother 77    Social History   Social History  . Marital status: Single    Spouse name: N/A  . Number of children: N/A  . Years of education: N/A   Occupational History  . Not on file.   Social History Main Topics  . Smoking status: Never Smoker  . Smokeless tobacco: Never Used  . Alcohol use No  . Drug use: No  . Sexual activity: Yes   Other Topics Concern  . Not on file   Social History Narrative  . No narrative on file     Constitutional: Denies fever, malaise, fatigue, headache or abrupt weight changes.  HEENT: Denies eye pain, eye redness, ear pain, ringing in the ears, wax buildup, runny nose, nasal congestion, bloody nose, or sore throat. Respiratory: Denies difficulty breathing, shortness of breath, cough or sputum production.   Cardiovascular: Denies chest pain, chest tightness, palpitations or swelling in the hands or feet.  Gastrointestinal: Denies abdominal pain, bloating, constipation, diarrhea or blood in the stool.  GU: Denies urgency, frequency, pain with urination, burning sensation, blood in urine, odor or discharge. Musculoskeletal: Denies decrease in range of motion, difficulty with gait, muscle pain or joint  pain and swelling.  Skin: Denies redness, rashes, lesions or ulcercations.  Neurological: Denies dizziness, difficulty with memory, difficulty with speech or problems with balance and coordination.  Psych: Denies anxiety, depression, SI/HI.  No other specific complaints in a complete review of systems (except as listed in HPI above).  Objective:   Physical Exam BP (!) 130/92   Pulse 69   Temp 97.9 F (36.6 C) (Oral)   Ht 5\' 5"  (1.651 m)   Wt 280 lb (127 kg)   LMP 06/08/2017   SpO2 97%   BMI 46.59 kg/m  Wt Readings from Last 3 Encounters:  06/12/17 280 lb (127 kg)  02/16/17 279 lb 8 oz (126.8 kg)  01/24/17  280 lb 8 oz (127.2 kg)    General: Appears her stated age, obese in NAD. Skin: Warm, dry and intact.  HEENT: Head: normal shape and size; Eyes: sclera white, no icterus, conjunctiva pink, PERRLA and EOMs intact; Ears: Tm's gray and intact, normal light reflex; Throat/Mouth: Teeth present, mucosa pink and moist, no exudate, lesions or ulcerations noted.  Neck:  Neck supple, trachea midline. No masses, lumps or thyromegaly present.  Cardiovascular: Normal rate and rhythm. S1,S2 noted.  No murmur, rubs or gallops noted. No JVD or BLE edema.  Pulmonary/Chest: Normal effort and positive vesicular breath sounds. No respiratory distress. No wheezes, rales or ronchi noted.  Abdomen: Soft and nontender. Normal bowel sounds. No distention or masses noted. Liver, spleen and kidneys non palpable. Musculoskeletal: Strength 5/5 BUE/BLE. No difficulty with gait.  Neurological: Alert and oriented. Cranial nerves II-XII grossly intact. Coordination normal.  Psychiatric: Mood and affect normal. Behavior is normal. Judgment and thought content normal.    BMET    Component Value Date/Time   NA 137 01/24/2017 1616   NA 137 07/03/2013 2132   K 4.4 01/24/2017 1616   K 3.6 07/03/2013 2132   CL 100 01/24/2017 1616   CL 103 07/03/2013 2132   CO2 26 01/24/2017 1616   CO2 29 07/03/2013 2132   GLUCOSE 81 01/24/2017 1616   GLUCOSE 90 07/03/2013 2132   BUN 15 01/24/2017 1616   BUN 9 07/03/2013 2132   CREATININE 1.05 (H) 01/24/2017 1616   CREATININE 0.85 07/03/2013 2132   CALCIUM 8.7 01/24/2017 1616   CALCIUM 9.0 07/03/2013 2132   GFRNONAA 66 01/24/2017 1616   GFRNONAA >60 07/03/2013 2132   GFRAA 76 01/24/2017 1616   GFRAA >60 07/03/2013 2132    Lipid Panel     Component Value Date/Time   CHOL 203 (H) 01/24/2017 1616   TRIG 48 01/24/2017 1616   HDL 60 01/24/2017 1616   CHOLHDL 3.4 01/24/2017 1616   LDLCALC 133 (H) 01/24/2017 1616    CBC    Component Value Date/Time   WBC 4.3 06/08/2016 1528    WBC 8.6 07/03/2013 2132   RBC 4.84 06/08/2016 1528   RBC 5.47 (H) 07/03/2013 2132   HGB 11.9 06/08/2016 1528   HCT 34.8 06/08/2016 1528   PLT 305 06/08/2016 1528   MCV 72 (L) 06/08/2016 1528   MCV 73 (L) 07/03/2013 2132   MCH 24.6 (L) 06/08/2016 1528   MCH 25.3 (L) 07/03/2013 2132   MCHC 34.2 06/08/2016 1528   MCHC 34.5 07/03/2013 2132   RDW 18.1 (H) 06/08/2016 1528   RDW 19.1 (H) 07/03/2013 2132   LYMPHSABS 2.5 03/17/2015 1618   EOSABS 0.1 03/17/2015 1618   BASOSABS 0.0 03/17/2015 1618    Hgb A1C Lab Results  Component Value Date  HGBA1C 4.9 06/08/2016             Assessment & Plan:   Preventative Health Maintenance:  Flu shot today Tetanus UTD She will schedule an appt for her pap smear Mammogram scheduled Encouraged her to consume a balanced diet and exercise regimen Advised her to see an eye doctor and dentist annually Will check CBC, CMET, Lipid, A1C, HIV and RPR today  RTC in 1 year, sooner if needed Webb Silversmith, NP

## 2017-06-13 LAB — CBC
Hematocrit: 38 % (ref 34.0–46.6)
Hemoglobin: 12.1 g/dL (ref 11.1–15.9)
MCH: 23.7 pg — AB (ref 26.6–33.0)
MCHC: 31.8 g/dL (ref 31.5–35.7)
MCV: 74 fL — AB (ref 79–97)
PLATELETS: 280 10*3/uL (ref 150–379)
RBC: 5.11 x10E6/uL (ref 3.77–5.28)
RDW: 20 % — AB (ref 12.3–15.4)
WBC: 4.2 10*3/uL (ref 3.4–10.8)

## 2017-06-13 LAB — COMPREHENSIVE METABOLIC PANEL
A/G RATIO: 1.2 (ref 1.2–2.2)
ALK PHOS: 59 IU/L (ref 39–117)
ALT: 7 IU/L (ref 0–32)
AST: 15 IU/L (ref 0–40)
Albumin: 3.8 g/dL (ref 3.5–5.5)
BUN/Creatinine Ratio: 13 (ref 9–23)
BUN: 15 mg/dL (ref 6–24)
CHLORIDE: 99 mmol/L (ref 96–106)
CO2: 24 mmol/L (ref 20–29)
Calcium: 8.8 mg/dL (ref 8.7–10.2)
Creatinine, Ser: 1.16 mg/dL — ABNORMAL HIGH (ref 0.57–1.00)
GFR calc non Af Amer: 59 mL/min/{1.73_m2} — ABNORMAL LOW (ref >59)
GFR, EST AFRICAN AMERICAN: 68 mL/min/{1.73_m2} (ref >59)
GLUCOSE: 82 mg/dL (ref 65–99)
Globulin, Total: 3.3 g/dL (ref 1.5–4.5)
POTASSIUM: 3.9 mmol/L (ref 3.5–5.2)
Sodium: 139 mmol/L (ref 134–144)
TOTAL PROTEIN: 7.1 g/dL (ref 6.0–8.5)

## 2017-06-13 LAB — LIPID PANEL
CHOLESTEROL TOTAL: 212 mg/dL — AB (ref 100–199)
Chol/HDL Ratio: 3.5 ratio (ref 0.0–4.4)
HDL: 60 mg/dL (ref >39)
LDL Calculated: 140 mg/dL — ABNORMAL HIGH (ref 0–99)
Triglycerides: 60 mg/dL (ref 0–149)
VLDL Cholesterol Cal: 12 mg/dL (ref 5–40)

## 2017-06-13 LAB — HEMOGLOBIN A1C
ESTIMATED AVERAGE GLUCOSE: 94 mg/dL
HEMOGLOBIN A1C: 4.9 % (ref 4.8–5.6)

## 2017-06-13 LAB — HIV ANTIBODY (ROUTINE TESTING W REFLEX): HIV Screen 4th Generation wRfx: NONREACTIVE

## 2017-06-13 LAB — RPR: RPR: NONREACTIVE

## 2017-06-16 MED ORDER — SIMVASTATIN 40 MG PO TABS: 40.0000 mg | ORAL_TABLET | Freq: Every day | ORAL | 1 refills | Status: DC

## 2017-06-16 NOTE — Addendum Note (Signed)
Addended by: Lurlean Nanny on: 06/16/2017 12:45 PM   Modules accepted: Orders

## 2017-06-22 ENCOUNTER — Ambulatory Visit
Admission: RE | Admit: 2017-06-22 | Discharge: 2017-06-22 | Disposition: A | Discharge status: Home / Self Care | Payer: 59 | Source: Ambulatory Visit | Attending: Internal Medicine | Admitting: Internal Medicine

## 2017-06-22 ENCOUNTER — Ambulatory Visit: Payer: 59 | Admitting: Internal Medicine

## 2017-06-22 DIAGNOSIS — R928 Other abnormal and inconclusive findings on diagnostic imaging of breast: Secondary | ICD-10-CM | POA: Insufficient documentation

## 2017-06-27 ENCOUNTER — Other Ambulatory Visit: Payer: 59

## 2017-06-27 ENCOUNTER — Ambulatory Visit: Payer: 59 | Admitting: Internal Medicine

## 2017-06-27 DIAGNOSIS — Z0289 Encounter for other administrative examinations: Secondary | ICD-10-CM

## 2017-07-04 ENCOUNTER — Other Ambulatory Visit: Payer: Self-pay | Admitting: Internal Medicine

## 2017-07-04 DIAGNOSIS — R7989 Other specified abnormal findings of blood chemistry: Secondary | ICD-10-CM

## 2017-07-04 DIAGNOSIS — R944 Abnormal results of kidney function studies: Secondary | ICD-10-CM

## 2017-07-11 ENCOUNTER — Telehealth: Payer: Self-pay | Admitting: Internal Medicine

## 2017-07-11 MED ORDER — PREVIFEM 0.25-35 MG-MCG PO TABS: 1.0000 | ORAL_TABLET | Freq: Every day | ORAL | 1 refills | Status: DC

## 2017-07-11 NOTE — Telephone Encounter (Signed)
Copied from [place] [id number]. Topic: Quick Communication - See Telephone Encounter >> Jul 11, 2017  2:57 PM Bea Graff, NT wrote: CRM for notification. See Telephone encounter for: Patient calling because she is needing her birth control called in? She said the previous physician that prescribed the medicine she is not going back to the dr, which was a dr at [facility]. That dr would not give permission to refill it since she hasn't been there in awhile but she has discontinued care with that dr. She is scheduled with Webb Silversmith for a pap smear for 08/09/17, and has lab work to be done this Friday 07/14/17. She wants to see if her birth control can be refilled before her pap is done? She uses Previfem birth control.  07/11/17.

## 2017-07-12 NOTE — Telephone Encounter (Signed)
This Rx was sent through e-scribe

## 2017-07-14 ENCOUNTER — Other Ambulatory Visit (INDEPENDENT_AMBULATORY_CARE_PROVIDER_SITE_OTHER): Payer: 59

## 2017-07-14 DIAGNOSIS — R7989 Other specified abnormal findings of blood chemistry: Secondary | ICD-10-CM

## 2017-07-14 DIAGNOSIS — R944 Abnormal results of kidney function studies: Secondary | ICD-10-CM

## 2017-07-15 LAB — BASIC METABOLIC PANEL
BUN/Creatinine Ratio: 15 (ref 9–23)
BUN: 17 mg/dL (ref 6–24)
CALCIUM: 8.7 mg/dL (ref 8.7–10.2)
CO2: 26 mmol/L (ref 20–29)
CREATININE: 1.1 mg/dL — AB (ref 0.57–1.00)
Chloride: 103 mmol/L (ref 96–106)
GFR calc Af Amer: 72 mL/min/{1.73_m2} (ref >59)
GFR, EST NON AFRICAN AMERICAN: 63 mL/min/{1.73_m2} (ref >59)
GLUCOSE: 114 mg/dL — AB (ref 65–99)
Potassium: 4 mmol/L (ref 3.5–5.2)
Sodium: 141 mmol/L (ref 134–144)

## 2017-07-19 NOTE — Telephone Encounter (Signed)
Received fax from pharmacy stating that the Previfem is unavailale to order and is being d/c... Please advise

## 2017-07-24 MED ORDER — NORETHINDRONE ACET-ETHINYL EST 1.5-30 MG-MCG PO TABS: 1.0000 | ORAL_TABLET | Freq: Every day | ORAL | 11 refills | Status: DC

## 2017-07-24 NOTE — Addendum Note (Signed)
Addended by: Jearld Fenton on: 07/24/2017 09:14 AM   Modules accepted: Orders

## 2017-07-24 NOTE — Telephone Encounter (Signed)
Junel sent to pharmacy

## 2017-08-09 ENCOUNTER — Encounter: Payer: 59 | Admitting: Internal Medicine

## 2017-09-08 ENCOUNTER — Telehealth: Payer: Self-pay | Admitting: Internal Medicine

## 2017-09-08 NOTE — Telephone Encounter (Signed)
im ok if PCP ok with this   [place]

## 2017-09-08 NOTE — Telephone Encounter (Signed)
Please advise if this is ok    Copied from [place] [phone]. Topic: Inquiry >> Sep 08, 2017  3:22 PM Boyd Kerbs wrote: Reason for CRM: patient is wanting to transfer to Dr. Olivia Mackie McLean-Scocuzza at [facility] from [place]. Asking for approval.

## 2017-09-08 NOTE — Telephone Encounter (Signed)
Lm for pt to call back and schedule with Dr. Aundra Dubin

## 2017-09-08 NOTE — Telephone Encounter (Signed)
Fine with me

## 2017-09-08 NOTE — Telephone Encounter (Signed)
im ok if PCP ok with this   Margaret Carlson

## 2017-09-12 ENCOUNTER — Ambulatory Visit: Payer: Managed Care, Other (non HMO) | Admitting: Internal Medicine

## 2017-09-12 ENCOUNTER — Encounter: Payer: Self-pay | Admitting: Internal Medicine

## 2017-09-12 VITALS — BP 140/88 | HR 81 | Temp 98.4°F | Wt 275.0 lb

## 2017-09-12 DIAGNOSIS — Z124 Encounter for screening for malignant neoplasm of cervix: Secondary | ICD-10-CM | POA: Diagnosis not present

## 2017-09-12 DIAGNOSIS — Z01419 Encounter for gynecological examination (general) (routine) without abnormal findings: Secondary | ICD-10-CM | POA: Diagnosis not present

## 2017-09-12 MED ORDER — SIMVASTATIN 40 MG PO TABS: 40.0000 mg | ORAL_TABLET | Freq: Every day | ORAL | 2 refills | Status: DC

## 2017-09-12 MED ORDER — NORETHINDRONE ACET-ETHINYL EST 1.5-30 MG-MCG PO TABS: 1.0000 | ORAL_TABLET | Freq: Every day | ORAL | 2 refills | Status: DC

## 2017-09-12 MED ORDER — AMLODIPINE BESYLATE 10 MG PO TABS: 10.0000 mg | ORAL_TABLET | Freq: Every day | ORAL | 2 refills | Status: DC

## 2017-09-12 NOTE — Progress Notes (Signed)
Subjective:    Patient ID: Margaret Carlson, female    DOB: 05-29-1976, 42 y.o.   MRN: [id number]  HPI  Pt presents to the clinic today for her pap smear. Her last pap smear was in 2017. She reports she is having regular periods. She declines STD screening today.  Review of Systems      Past Medical History:  Diagnosis Date  . Hypertension     Current Outpatient Medications  Medication Sig Dispense Refill  . acyclovir (ZOVIRAX) 400 MG tablet Take 400 mg by mouth daily as needed.    Marland Kitchen amLODipine (NORVASC) 10 MG tablet Take 1 tablet (10 mg total) by mouth daily. 90 tablet 2  . Calcium-Vitamin D-Vitamin K (CALCIUM + D + K PO) Take 1 capsule by mouth daily.    . cyanocobalamin 2000 MCG tablet Take 2,000 mcg by mouth daily.    . Multiple Vitamin (MULTIVITAMIN) tablet Take 1 tablet by mouth daily.    . Norethindrone Acetate-Ethinyl Estradiol (JUNEL,LOESTRIN,MICROGESTIN) 1.5-30 MG-MCG tablet Take 1 tablet by mouth daily. 3 Package 2  . PREVIFEM 0.25-35 MG-MCG tablet Take 1 tablet daily by mouth. 1 Package 1  . simvastatin (ZOCOR) 40 MG tablet Take 1 tablet (40 mg total) by mouth at bedtime. 90 tablet 2  . terbinafine (LAMISIL) 250 MG tablet Every other day     No current facility-administered medications for this visit.     Allergies  Allergen Reactions  . Ranitidine Swelling    Face and lip swelling    Family History  Problem Relation Age of Onset  . Arthritis Maternal Grandmother   . Hypertension Maternal Grandmother   . Breast cancer Maternal Grandmother 77    Social History   Socioeconomic History  . Marital status: Single    Spouse name: Not on file  . Number of children: Not on file  . Years of education: Not on file  . Highest education level: Not on file  Social Needs  . Financial resource strain: Not on file  . Food insecurity - worry: Not on file  . Food insecurity - inability: Not on file  . Transportation needs - medical: Not on file  . Transportation  needs - non-medical: Not on file  Occupational History  . Not on file  Tobacco Use  . Smoking status: Never Smoker  . Smokeless tobacco: Never Used  Substance and Sexual Activity  . Alcohol use: No    Alcohol/week: 0.0 oz  . Drug use: No  . Sexual activity: Yes  Other Topics Concern  . Not on file  Social History Narrative  . Not on file     Constitutional: Denies fever, malaise, fatigue, headache or abrupt weight changes.  GU: Denies urgency, frequency, pain with urination, burning sensation, blood in urine, odor or discharge.  No other specific complaints in a complete review of systems (except as listed in HPI above).  Objective:   Physical Exam   BP 140/88   Pulse 81   Temp 98.4 F (36.9 C) (Oral)   Wt 275 lb (124.7 kg)   LMP 09/03/2017   SpO2 98%   BMI 45.76 kg/m  Wt Readings from Last 3 Encounters:  09/12/17 275 lb (124.7 kg)  06/12/17 280 lb (127 kg)  02/16/17 279 lb 8 oz (126.8 kg)    General: Appears her stated age, obese in NAD. Abdomen: Soft and nontender. Normal bowel sounds. No distention or masses noted. Pelvic: Normal female anatomy. Cervix without changes. Small amount of  thin white discharge noted, no odor. Adnexa non palpable.  BMET    Component Value Date/Time   NA 141 07/14/2017 0753   NA 137 07/03/2013 2132   K 4.0 07/14/2017 0753   K 3.6 07/03/2013 2132   CL 103 07/14/2017 0753   CL 103 07/03/2013 2132   CO2 26 07/14/2017 0753   CO2 29 07/03/2013 2132   GLUCOSE 114 (H) 07/14/2017 0753   GLUCOSE 90 07/03/2013 2132   BUN 17 07/14/2017 0753   BUN 9 07/03/2013 2132   CREATININE 1.10 (H) 07/14/2017 0753   CREATININE 0.85 07/03/2013 2132   CALCIUM 8.7 07/14/2017 0753   CALCIUM 9.0 07/03/2013 2132   GFRNONAA 63 07/14/2017 0753   GFRNONAA >60 07/03/2013 2132   GFRAA 72 07/14/2017 0753   GFRAA >60 07/03/2013 2132    Lipid Panel     Component Value Date/Time   CHOL 212 (H) 06/12/2017 1252   TRIG 60 06/12/2017 1252   HDL 60  06/12/2017 1252   CHOLHDL 3.5 06/12/2017 1252   LDLCALC 140 (H) 06/12/2017 1252    CBC    Component Value Date/Time   WBC 4.2 06/12/2017 1252   WBC 8.6 07/03/2013 2132   RBC 5.11 06/12/2017 1252   RBC 5.47 (H) 07/03/2013 2132   HGB 12.1 06/12/2017 1252   HCT 38.0 06/12/2017 1252   PLT 280 06/12/2017 1252   MCV 74 (L) 06/12/2017 1252   MCV 73 (L) 07/03/2013 2132   MCH 23.7 (L) 06/12/2017 1252   MCH 25.3 (L) 07/03/2013 2132   MCHC 31.8 06/12/2017 1252   MCHC 34.5 07/03/2013 2132   RDW 20.0 (H) 06/12/2017 1252   RDW 19.1 (H) 07/03/2013 2132   LYMPHSABS 2.5 03/17/2015 1618   EOSABS 0.1 03/17/2015 1618   BASOSABS 0.0 03/17/2015 1618    Hgb A1C Lab Results  Component Value Date   HGBA1C 4.9 06/12/2017           Assessment & Plan:   Screening for Cervical Cancer with Routine GYN Exam:  Pap smear today She declines STD screening  RTC in 1 year, sooner if needed Webb Silversmith, NP

## 2017-09-12 NOTE — Addendum Note (Signed)
Addended by: Lurlean Nanny on: 09/12/2017 10:40 AM   Modules accepted: Orders

## 2017-09-12 NOTE — Patient Instructions (Signed)

## 2017-09-14 ENCOUNTER — Encounter: Payer: 59 | Admitting: Internal Medicine

## 2017-09-15 LAB — PAP LB AND HPV HIGH-RISK
HPV, HIGH-RISK: NEGATIVE
PAP SMEAR COMMENT: 0

## 2017-09-17 ENCOUNTER — Encounter: Payer: Self-pay | Admitting: Internal Medicine

## 2017-09-18 MED ORDER — NORETHINDRONE ACET-ETHINYL EST 1.5-30 MG-MCG PO TABS: 1.0000 | ORAL_TABLET | Freq: Every day | ORAL | 2 refills | Status: DC

## 2017-09-19 MED ORDER — ROSUVASTATIN CALCIUM 10 MG PO TABS: 10.0000 mg | ORAL_TABLET | Freq: Every day | ORAL | 0 refills | Status: DC

## 2017-09-19 NOTE — Addendum Note (Signed)
Addended by: Lurlean Nanny on: 09/19/2017 10:18 AM   Modules accepted: Orders

## 2017-11-07 ENCOUNTER — Other Ambulatory Visit: Payer: Self-pay | Admitting: Internal Medicine

## 2018-01-15 ENCOUNTER — Encounter: Payer: Self-pay | Admitting: Internal Medicine

## 2018-01-16 ENCOUNTER — Ambulatory Visit (INDEPENDENT_AMBULATORY_CARE_PROVIDER_SITE_OTHER)
Admission: RE | Admit: 2018-01-16 | Discharge: 2018-01-16 | Disposition: A | Discharge status: Home / Self Care | Payer: Managed Care, Other (non HMO) | Source: Ambulatory Visit | Attending: Internal Medicine | Admitting: Internal Medicine

## 2018-01-16 ENCOUNTER — Ambulatory Visit: Payer: Managed Care, Other (non HMO) | Admitting: Internal Medicine

## 2018-01-16 ENCOUNTER — Encounter: Payer: Self-pay | Admitting: Internal Medicine

## 2018-01-16 VITALS — BP 130/80 | HR 84 | Temp 98.4°F | Wt 281.0 lb

## 2018-01-16 DIAGNOSIS — M25562 Pain in left knee: Secondary | ICD-10-CM

## 2018-01-16 NOTE — Progress Notes (Signed)
X ray

## 2018-01-16 NOTE — Patient Instructions (Signed)

## 2018-01-17 ENCOUNTER — Encounter: Payer: Self-pay | Admitting: Internal Medicine

## 2018-01-17 NOTE — Progress Notes (Signed)
Subjective:    Patient ID: Margaret Carlson, female    DOB: 11-May-1976, 42 y.o.   MRN: [id number]  HPI  Pt presents to the clinic today with c/o left knee pain. She reports she was at the gym 1 month ago. She twisted her knee, while trying to get up doing exercises. She was able to walk it off then, but reports she has been having intermittent pain since then. She described the pain as sharp and stabbing. The pain does not radiate. She denies numbness, tingling or weakness in the lower extremity. She has not noticed any swelling or bruising. She reports the pain is triggered by position changes, walking up and down stairs. She reports normal walking and even running does not bother her. She has tried rest and Ibuprofen with minimal relief.   Review of Systems      Past Medical History:  Diagnosis Date  . Hypertension     Current Outpatient Medications  Medication Sig Dispense Refill  . acyclovir (ZOVIRAX) 400 MG tablet Take 400 mg by mouth daily as needed.    Marland Kitchen amLODipine (NORVASC) 10 MG tablet Take 1 tablet (10 mg total) by mouth daily. 90 tablet 2  . Calcium-Vitamin D-Vitamin K (CALCIUM + D + K PO) Take 1 capsule by mouth daily.    . cyanocobalamin 2000 MCG tablet Take 2,000 mcg by mouth daily.    . Multiple Vitamin (MULTIVITAMIN) tablet Take 1 tablet by mouth daily.    . Norethindrone Acetate-Ethinyl Estradiol (JUNEL,LOESTRIN,MICROGESTIN) 1.5-30 MG-MCG tablet Take 1 tablet by mouth daily. 3 Package 2  . PREVIFEM 0.25-35 MG-MCG tablet Take 1 tablet daily by mouth. 1 Package 1  . rosuvastatin (CRESTOR) 10 MG tablet TAKE 1 TABLET BY MOUTH  DAILY 90 tablet 1  . terbinafine (LAMISIL) 250 MG tablet Every other day     No current facility-administered medications for this visit.     Allergies  Allergen Reactions  . Ranitidine Swelling    Face and lip swelling    Family History  Problem Relation Age of Onset  . Arthritis Maternal Grandmother   . Hypertension Maternal Grandmother    . Breast cancer Maternal Grandmother 77    Social History   Socioeconomic History  . Marital status: Single    Spouse name: Not on file  . Number of children: Not on file  . Years of education: Not on file  . Highest education level: Not on file  Occupational History  . Not on file  Social Needs  . Financial resource strain: Not on file  . Food insecurity:    Worry: Not on file    Inability: Not on file  . Transportation needs:    Medical: Not on file    Non-medical: Not on file  Tobacco Use  . Smoking status: Never Smoker  . Smokeless tobacco: Never Used  Substance and Sexual Activity  . Alcohol use: No    Alcohol/week: 0.0 oz  . Drug use: No  . Sexual activity: Yes  Lifestyle  . Physical activity:    Days per week: Not on file    Minutes per session: Not on file  . Stress: Not on file  Relationships  . Social connections:    Talks on phone: Not on file    Gets together: Not on file    Attends religious service: Not on file    Active member of club or organization: Not on file    Attends meetings of clubs or organizations:  Not on file    Relationship status: Not on file  . Intimate partner violence:    Fear of current or ex partner: Not on file    Emotionally abused: Not on file    Physically abused: Not on file    Forced sexual activity: Not on file  Other Topics Concern  . Not on file  Social History Narrative  . Not on file     Constitutional: Denies fever, malaise, fatigue, headache or abrupt weight changes.  Musculoskeletal: Pt reports left knee pain. Denies decrease in range of motion, difficulty with gait, muscle pain or joint swelling.  Skin: Denies redness, rashes, lesions or ulcercations.    No other specific complaints in a complete review of systems (except as listed in HPI above).  Objective:   Physical Exam  BP 130/80   Pulse 84   Temp 98.4 F (36.9 C) (Oral)   Wt 281 lb (127.5 kg)   LMP 01/11/2018   SpO2 98%   BMI 46.76 kg/m    Wt Readings from Last 3 Encounters:  01/16/18 281 lb (127.5 kg)  09/12/17 275 lb (124.7 kg)  06/12/17 280 lb (127 kg)    General: Appears herstated age, obese in NAD. Musculoskeletal: Normal flexion, extension of the left knee. No signs of joint swelling. No pain with palpation. Negative Lachman's. Negative McMurray. No difficulty with gait.  Neurological: Alert and oriented. Sensation intact to BLE.   BMET    Component Value Date/Time   NA 141 07/14/2017 0753   NA 137 07/03/2013 2132   K 4.0 07/14/2017 0753   K 3.6 07/03/2013 2132   CL 103 07/14/2017 0753   CL 103 07/03/2013 2132   CO2 26 07/14/2017 0753   CO2 29 07/03/2013 2132   GLUCOSE 114 (H) 07/14/2017 0753   GLUCOSE 90 07/03/2013 2132   BUN 17 07/14/2017 0753   BUN 9 07/03/2013 2132   CREATININE 1.10 (H) 07/14/2017 0753   CREATININE 0.85 07/03/2013 2132   CALCIUM 8.7 07/14/2017 0753   CALCIUM 9.0 07/03/2013 2132   GFRNONAA 63 07/14/2017 0753   GFRNONAA >60 07/03/2013 2132   GFRAA 72 07/14/2017 0753   GFRAA >60 07/03/2013 2132    Lipid Panel     Component Value Date/Time   CHOL 212 (H) 06/12/2017 1252   TRIG 60 06/12/2017 1252   HDL 60 06/12/2017 1252   CHOLHDL 3.5 06/12/2017 1252   LDLCALC 140 (H) 06/12/2017 1252    CBC    Component Value Date/Time   WBC 4.2 06/12/2017 1252   WBC 8.6 07/03/2013 2132   RBC 5.11 06/12/2017 1252   RBC 5.47 (H) 07/03/2013 2132   HGB 12.1 06/12/2017 1252   HCT 38.0 06/12/2017 1252   PLT 280 06/12/2017 1252   MCV 74 (L) 06/12/2017 1252   MCV 73 (L) 07/03/2013 2132   MCH 23.7 (L) 06/12/2017 1252   MCH 25.3 (L) 07/03/2013 2132   MCHC 31.8 06/12/2017 1252   MCHC 34.5 07/03/2013 2132   RDW 20.0 (H) 06/12/2017 1252   RDW 19.1 (H) 07/03/2013 2132   LYMPHSABS 2.5 03/17/2015 1618   EOSABS 0.1 03/17/2015 1618   BASOSABS 0.0 03/17/2015 1618    Hgb A1C Lab Results  Component Value Date   HGBA1C 4.9 06/12/2017            Assessment & Plan:   Acute Left Knee  Pain:  Possible meniscal injury Xray left knee today Discussed wearing a Neoprene sleeve while working out Continue Ibuprofen  as needed Ice may be helpful Knee exercises given Discussed referral to PT vs MRI, but will hold off until xray results are back  Will follow up after xray, return precautions discussed Webb Silversmith, NP

## 2018-01-18 ENCOUNTER — Other Ambulatory Visit: Payer: Self-pay | Admitting: Internal Medicine

## 2018-01-18 DIAGNOSIS — M25562 Pain in left knee: Secondary | ICD-10-CM

## 2018-01-23 ENCOUNTER — Other Ambulatory Visit: Payer: Self-pay | Admitting: Internal Medicine

## 2018-01-24 ENCOUNTER — Encounter (INDEPENDENT_AMBULATORY_CARE_PROVIDER_SITE_OTHER): Payer: Self-pay

## 2018-01-31 ENCOUNTER — Telehealth: Payer: Self-pay | Admitting: Internal Medicine

## 2018-01-31 MED ORDER — NORETHINDRONE ACET-ETHINYL EST 1.5-30 MG-MCG PO TABS: ORAL_TABLET | ORAL | 1 refills | Status: DC

## 2018-01-31 NOTE — Telephone Encounter (Signed)
Copied from [place] [phone]. Topic: Quick Communication - See Telephone Encounter >> Jan 31, 2018  2:48 PM Conception Chancy, NT wrote: CRM for notification. See Telephone encounter for: 01/31/18.  [facility] is calling and states they need a call back from the nurse they have questions in regards to [place] 1.5/30 1.5-30 MG-MCG tablet.   Cb# [phone]  Reference # [id number]

## 2018-01-31 NOTE — Telephone Encounter (Signed)
Rx was resent saying 3 weeks off and 1 week off for instructions

## 2018-03-15 ENCOUNTER — Encounter: Payer: Self-pay | Admitting: Internal Medicine

## 2018-03-15 MED ORDER — BLOOD PRESSURE MONITOR/ARM DEVI: 1.0000 | Freq: Once | 0 refills | Status: AC

## 2018-04-25 ENCOUNTER — Other Ambulatory Visit: Payer: Self-pay | Admitting: Internal Medicine

## 2018-04-25 DIAGNOSIS — Z01419 Encounter for gynecological examination (general) (routine) without abnormal findings: Secondary | ICD-10-CM

## 2018-05-24 ENCOUNTER — Encounter: Payer: Managed Care, Other (non HMO) | Admitting: Internal Medicine

## 2018-05-24 ENCOUNTER — Encounter: Payer: Self-pay | Admitting: Internal Medicine

## 2018-05-24 DIAGNOSIS — Z0289 Encounter for other administrative examinations: Secondary | ICD-10-CM

## 2018-05-27 ENCOUNTER — Other Ambulatory Visit: Payer: Self-pay | Admitting: Internal Medicine

## 2018-05-31 ENCOUNTER — Encounter: Payer: Managed Care, Other (non HMO) | Admitting: Internal Medicine

## 2018-06-05 ENCOUNTER — Encounter: Payer: Self-pay | Admitting: Internal Medicine

## 2018-06-05 ENCOUNTER — Ambulatory Visit (INDEPENDENT_AMBULATORY_CARE_PROVIDER_SITE_OTHER): Payer: Managed Care, Other (non HMO) | Admitting: Internal Medicine

## 2018-06-05 VITALS — BP 132/86 | HR 73 | Temp 97.9°F | Ht 65.0 in | Wt 281.0 lb

## 2018-06-05 DIAGNOSIS — Z113 Encounter for screening for infections with a predominantly sexual mode of transmission: Secondary | ICD-10-CM

## 2018-06-05 DIAGNOSIS — I10 Essential (primary) hypertension: Secondary | ICD-10-CM

## 2018-06-05 DIAGNOSIS — Z124 Encounter for screening for malignant neoplasm of cervix: Secondary | ICD-10-CM | POA: Diagnosis not present

## 2018-06-05 DIAGNOSIS — E782 Mixed hyperlipidemia: Secondary | ICD-10-CM

## 2018-06-05 DIAGNOSIS — Z0000 Encounter for general adult medical examination without abnormal findings: Secondary | ICD-10-CM

## 2018-06-05 DIAGNOSIS — Z1239 Encounter for other screening for malignant neoplasm of breast: Secondary | ICD-10-CM

## 2018-06-05 NOTE — Patient Instructions (Signed)
Health Maintenance, Female Adopting a healthy lifestyle and getting preventive care can go a Olivo way to promote health and wellness. Talk with your health care provider about what schedule of regular examinations is right for you. This is a good chance for you to check in with your provider about disease prevention and staying healthy. In between checkups, there are plenty of things you can do on your own. Experts have done a lot of research about which lifestyle changes and preventive measures are most likely to keep you healthy. Ask your health care provider for more information. Weight and diet Eat a healthy diet  Be sure to include plenty of vegetables, fruits, low-fat dairy products, and lean protein.  Do not eat a lot of foods high in solid fats, added sugars, or salt.  Get regular exercise. This is one of the most important things you can do for your health. ? Most adults should exercise for at least 150 minutes each week. The exercise should increase your heart rate and make you sweat (moderate-intensity exercise). ? Most adults should also do strengthening exercises at least twice a week. This is in addition to the moderate-intensity exercise.  Maintain a healthy weight  Body mass index (BMI) is a measurement that can be used to identify possible weight problems. It estimates body fat based on height and weight. Your health care provider can help determine your BMI and help you achieve or maintain a healthy weight.  For females 20 years of age and older: ? A BMI below 18.5 is considered underweight. ? A BMI of 18.5 to 24.9 is normal. ? A BMI of 25 to 29.9 is considered overweight. ? A BMI of 30 and above is considered obese.  Watch levels of cholesterol and blood lipids  You should start having your blood tested for lipids and cholesterol at 42 years of age, then have this test every 5 years.  You may need to have your cholesterol levels checked more often if: ? Your lipid or  cholesterol levels are high. ? You are older than 42 years of age. ? You are at high risk for heart disease.  Cancer screening Lung Cancer  Lung cancer screening is recommended for adults 55-80 years old who are at high risk for lung cancer because of a history of smoking.  A yearly low-dose CT scan of the lungs is recommended for people who: ? Currently smoke. ? Have quit within the past 15 years. ? Have at least a 30-pack-year history of smoking. A pack year is smoking an average of one pack of cigarettes a day for 1 year.  Yearly screening should continue until it has been 15 years since you quit.  Yearly screening should stop if you develop a health problem that would prevent you from having lung cancer treatment.  Breast Cancer  Practice breast self-awareness. This means understanding how your breasts normally appear and feel.  It also means doing regular breast self-exams. Let your health care provider know about any changes, no matter how small.  If you are in your 20s or 30s, you should have a clinical breast exam (CBE) by a health care provider every 1-3 years as part of a regular health exam.  If you are 40 or older, have a CBE every year. Also consider having a breast X-ray (mammogram) every year.  If you have a family history of breast cancer, talk to your health care provider about genetic screening.  If you are at high risk   for breast cancer, talk to your health care provider about having an MRI and a mammogram every year.  Breast cancer gene (BRCA) assessment is recommended for women who have family members with BRCA-related cancers. BRCA-related cancers include: ? Breast. ? Ovarian. ? Tubal. ? Peritoneal cancers.  Results of the assessment will determine the need for genetic counseling and BRCA1 and BRCA2 testing.  Cervical Cancer Your health care provider may recommend that you be screened regularly for cancer of the pelvic organs (ovaries, uterus, and  vagina). This screening involves a pelvic examination, including checking for microscopic changes to the surface of your cervix (Pap test). You may be encouraged to have this screening done every 3 years, beginning at age 22.  For women ages 56-65, health care providers may recommend pelvic exams and Pap testing every 3 years, or they may recommend the Pap and pelvic exam, combined with testing for human papilloma virus (HPV), every 5 years. Some types of HPV increase your risk of cervical cancer. Testing for HPV may also be done on women of any age with unclear Pap test results.  Other health care providers may not recommend any screening for nonpregnant women who are considered low risk for pelvic cancer and who do not have symptoms. Ask your health care provider if a screening pelvic exam is right for you.  If you have had past treatment for cervical cancer or a condition that could lead to cancer, you need Pap tests and screening for cancer for at least 20 years after your treatment. If Pap tests have been discontinued, your risk factors (such as having a new sexual partner) need to be reassessed to determine if screening should resume. Some women have medical problems that increase the chance of getting cervical cancer. In these cases, your health care provider may recommend more frequent screening and Pap tests.  Colorectal Cancer  This type of cancer can be detected and often prevented.  Routine colorectal cancer screening usually begins at 42 years of age and continues through 42 years of age.  Your health care provider may recommend screening at an earlier age if you have risk factors for colon cancer.  Your health care provider may also recommend using home test kits to check for hidden blood in the stool.  A small camera at the end of a tube can be used to examine your colon directly (sigmoidoscopy or colonoscopy). This is done to check for the earliest forms of colorectal  cancer.  Routine screening usually begins at age 33.  Direct examination of the colon should be repeated every 5-10 years through 43 years of age. However, you may need to be screened more often if early forms of precancerous polyps or small growths are found.  Skin Cancer  Check your skin from head to toe regularly.  Tell your health care provider about any new moles or changes in moles, especially if there is a change in a mole's shape or color.  Also tell your health care provider if you have a mole that is larger than the size of a pencil eraser.  Always use sunscreen. Apply sunscreen liberally and repeatedly throughout the day.  Protect yourself by wearing Griffing sleeves, pants, a wide-brimmed hat, and sunglasses whenever you are outside.  Heart disease, diabetes, and high blood pressure  High blood pressure causes heart disease and increases the risk of stroke. High blood pressure is more likely to develop in: ? People who have blood pressure in the high end of  the normal range (130-139/85-89 mm Hg). ? People who are overweight or obese. ? People who are African American.  If you are 21-29 years of age, have your blood pressure checked every 3-5 years. If you are 3 years of age or older, have your blood pressure checked every year. You should have your blood pressure measured twice-once when you are at a hospital or clinic, and once when you are not at a hospital or clinic. Record the average of the two measurements. To check your blood pressure when you are not at a hospital or clinic, you can use: ? An automated blood pressure machine at a pharmacy. ? A home blood pressure monitor.  If you are between 17 years and 37 years old, ask your health care provider if you should take aspirin to prevent strokes.  Have regular diabetes screenings. This involves taking a blood sample to check your fasting blood sugar level. ? If you are at a normal weight and have a low risk for diabetes,  have this test once every three years after 42 years of age. ? If you are overweight and have a high risk for diabetes, consider being tested at a younger age or more often. Preventing infection Hepatitis B  If you have a higher risk for hepatitis B, you should be screened for this virus. You are considered at high risk for hepatitis B if: ? You were born in a country where hepatitis B is common. Ask your health care provider which countries are considered high risk. ? Your parents were born in a high-risk country, and you have not been immunized against hepatitis B (hepatitis B vaccine). ? You have HIV or AIDS. ? You use needles to inject street drugs. ? You live with someone who has hepatitis B. ? You have had sex with someone who has hepatitis B. ? You get hemodialysis treatment. ? You take certain medicines for conditions, including cancer, organ transplantation, and autoimmune conditions.  Hepatitis C  Blood testing is recommended for: ? Everyone born from [birth year] through 1965. ? Anyone with known risk factors for hepatitis C.  Sexually transmitted infections (STIs)  You should be screened for sexually transmitted infections (STIs) including gonorrhea and chlamydia if: ? You are sexually active and are younger than 42 years of age. ? You are older than 42 years of age and your health care provider tells you that you are at risk for this type of infection. ? Your sexual activity has changed since you were last screened and you are at an increased risk for chlamydia or gonorrhea. Ask your health care provider if you are at risk.  If you do not have HIV, but are at risk, it may be recommended that you take a prescription medicine daily to prevent HIV infection. This is called pre-exposure prophylaxis (PrEP). You are considered at risk if: ? You are sexually active and do not regularly use condoms or know the HIV status of your partner(s). ? You take drugs by injection. ? You are  sexually active with a partner who has HIV.  Talk with your health care provider about whether you are at high risk of being infected with HIV. If you choose to begin PrEP, you should first be tested for HIV. You should then be tested every 3 months for as Jay as you are taking PrEP. Pregnancy  If you are premenopausal and you may become pregnant, ask your health care provider about preconception counseling.  If you may become  pregnant, take 400 to 800 micrograms (mcg) of folic acid every day.  If you want to prevent pregnancy, talk to your health care provider about birth control (contraception). Osteoporosis and menopause  Osteoporosis is a disease in which the bones lose minerals and strength with aging. This can result in serious bone fractures. Your risk for osteoporosis can be identified using a bone density scan.  If you are 13 years of age or older, or if you are at risk for osteoporosis and fractures, ask your health care provider if you should be screened.  Ask your health care provider whether you should take a calcium or vitamin D supplement to lower your risk for osteoporosis.  Menopause may have certain physical symptoms and risks.  Hormone replacement therapy may reduce some of these symptoms and risks. Talk to your health care provider about whether hormone replacement therapy is right for you. Follow these instructions at home:  Schedule regular health, dental, and eye exams.  Stay current with your immunizations.  Do not use any tobacco products including cigarettes, chewing tobacco, or electronic cigarettes.  If you are pregnant, do not drink alcohol.  If you are breastfeeding, limit how much and how often you drink alcohol.  Limit alcohol intake to no more than 1 drink per day for nonpregnant women. One drink equals 12 ounces of beer, 5 ounces of wine, or 1 ounces of hard liquor.  Do not use street drugs.  Do not share needles.  Ask your health care  provider for help if you need support or information about quitting drugs.  Tell your health care provider if you often feel depressed.  Tell your health care provider if you have ever been abused or do not feel safe at home. This information is not intended to replace advice given to you by your health care provider. Make sure you discuss any questions you have with your health care provider. Document Released: 02/28/2011 Document Revised: 01/21/2016 Document Reviewed: 05/19/2015 Elsevier Interactive Patient Education  [employer].

## 2018-06-05 NOTE — Progress Notes (Signed)
Subjective:    Patient ID: Margaret Carlson, female    DOB: February 14, 1976, 42 y.o.   MRN: [id number]  HPI  Pt presents to the clinic today for her annual exam. She is also due to follow up chronic conditions.  HTN: Her BP today is 132/86. She is taking Amlodipine as prescribed. ECG from 02/2015 reviewed.  HLD: Her last LDL was 140, 05/2017. She denies myalgias on Rosuvastatin. She tries to consume a low fat diet.  Flu: 05/2018 Tetanus: 02/2015 Pap Smear: 08/2017 Mammogram: 05/2017 Vision Screening: annually Dentist: biannually  Diet: She does eat meat. She consumes more veggies than fruits. She tries to avoid fried foods. She drinks mostly water, coffee. Exercise: Boot camp, 3 days per week   Review of Systems  Past Medical History:  Diagnosis Date  . Hypertension     Current Outpatient Medications  Medication Sig Dispense Refill  . acyclovir (ZOVIRAX) 400 MG tablet Take 400 mg by mouth daily as needed.    Marland Kitchen amLODipine (NORVASC) 10 MG tablet Take 1 tablet (10 mg total) by mouth daily. MUST SCHEDULE ANNUAL EXAM FOR October 90 tablet 0  . Calcium-Vitamin D-Vitamin K (CALCIUM + D + K PO) Take 1 capsule by mouth daily.    . cyanocobalamin 2000 MCG tablet Take 2,000 mcg by mouth daily.    . Multiple Vitamin (MULTIVITAMIN) tablet Take 1 tablet by mouth daily.    . Norethindrone Acetate-Ethinyl Estradiol (JUNEL 1.5/30) 1.5-30 MG-MCG tablet 3 weeks on and 1 week off 3 Package 1  . PREVIFEM 0.25-35 MG-MCG tablet Take 1 tablet daily by mouth. 1 Package 1  . rosuvastatin (CRESTOR) 10 MG tablet TAKE 1 TABLET BY MOUTH  DAILY 90 tablet 0  . terbinafine (LAMISIL) 250 MG tablet Every other day     No current facility-administered medications for this visit.     Allergies  Allergen Reactions  . Ranitidine Swelling    Face and lip swelling    Family History  Problem Relation Age of Onset  . Arthritis Maternal Grandmother   . Hypertension Maternal Grandmother   . Breast cancer Maternal  Grandmother 77    Social History   Socioeconomic History  . Marital status: Single    Spouse name: Not on file  . Number of children: Not on file  . Years of education: Not on file  . Highest education level: Not on file  Occupational History  . Not on file  Social Needs  . Financial resource strain: Not on file  . Food insecurity:    Worry: Not on file    Inability: Not on file  . Transportation needs:    Medical: Not on file    Non-medical: Not on file  Tobacco Use  . Smoking status: Never Smoker  . Smokeless tobacco: Never Used  Substance and Sexual Activity  . Alcohol use: No    Alcohol/week: 0.0 standard drinks  . Drug use: No  . Sexual activity: Yes  Lifestyle  . Physical activity:    Days per week: Not on file    Minutes per session: Not on file  . Stress: Not on file  Relationships  . Social connections:    Talks on phone: Not on file    Gets together: Not on file    Attends religious service: Not on file    Active member of club or organization: Not on file    Attends meetings of clubs or organizations: Not on file    Relationship status:  Not on file  . Intimate partner violence:    Fear of current or ex partner: Not on file    Emotionally abused: Not on file    Physically abused: Not on file    Forced sexual activity: Not on file  Other Topics Concern  . Not on file  Social History Narrative  . Not on file     Constitutional: Denies fever, malaise, fatigue, headache or abrupt weight changes.  HEENT: Denies eye pain, eye redness, ear pain, ringing in the ears, wax buildup, runny nose, nasal congestion, bloody nose, or sore throat. Respiratory: Denies difficulty breathing, shortness of breath, cough or sputum production.   Cardiovascular: Denies chest pain, chest tightness, palpitations or swelling in the hands or feet.  Gastrointestinal: Pt reports belching. Denies abdominal pain, bloating, constipation, diarrhea or blood in the stool.  GU: Denies  urgency, frequency, pain with urination, burning sensation, blood in urine, odor or discharge. Musculoskeletal: Denies decrease in range of motion, difficulty with gait, muscle pain or joint pain and swelling.  Skin: Denies redness, rashes, lesions or ulcercations.  Neurological: Denies dizziness, difficulty with memory, difficulty with speech or problems with balance and coordination.  Psych: Denies anxiety, depression, SI/HI.  No other specific complaints in a complete review of systems (except as listed in HPI above).     Objective:   Physical Exam   BP 132/86   Pulse 73   Temp 97.9 F (36.6 C) (Oral)   Ht 5\' 5"  (1.651 m)   Wt 281 lb (127.5 kg)   SpO2 98%   BMI 46.76 kg/m  Wt Readings from Last 3 Encounters:  06/05/18 281 lb (127.5 kg)  01/16/18 281 lb (127.5 kg)  09/12/17 275 lb (124.7 kg)    General: Appears her stated age, obese, in NAD. Skin: Warm, dry and intact. Hypopigmented areas note on bilateral thighs. HEENT: Head: normal shape and size; Eyes: sclera white, no icterus, conjunctiva pink, PERRLA and EOMs intact; Ears: Tm's gray and intact, normal light reflex; Throat/Mouth: Teeth present, mucosa pink and moist, no exudate, lesions or ulcerations noted.  Neck:  Neck supple, trachea midline. No masses, lumps or thyromegaly present.  Cardiovascular: Normal rate and rhythm. S1,S2 noted.  No murmur, rubs or gallops noted. No JVD or BLE edema.  Pulmonary/Chest: Normal effort and positive vesicular breath sounds. No respiratory distress. No wheezes, rales or ronchi noted.  Abdomen: Soft and nontender. Normal bowel sounds. No distention or masses noted. Liver, spleen and kidneys non palpable. Pelvic: Normal female anatomy. Cervix not visualized. Moderate amount of yellow discharge noted in vaginal vault. No CMT. Adnexa non palpable. Musculoskeletal: Strength 5/5 BUE/BLE. No difficulty with gait.  Neurological: Alert and oriented. Cranial nerves II-XII grossly intact.  Coordination normal.  Psychiatric: Mood and affect normal. Behavior is normal. Judgment and thought content normal.     BMET    Component Value Date/Time   NA 141 07/14/2017 0753   NA 137 07/03/2013 2132   K 4.0 07/14/2017 0753   K 3.6 07/03/2013 2132   CL 103 07/14/2017 0753   CL 103 07/03/2013 2132   CO2 26 07/14/2017 0753   CO2 29 07/03/2013 2132   GLUCOSE 114 (H) 07/14/2017 0753   GLUCOSE 90 07/03/2013 2132   BUN 17 07/14/2017 0753   BUN 9 07/03/2013 2132   CREATININE 1.10 (H) 07/14/2017 0753   CREATININE 0.85 07/03/2013 2132   CALCIUM 8.7 07/14/2017 0753   CALCIUM 9.0 07/03/2013 2132   GFRNONAA 63 07/14/2017 0753  GFRNONAA >60 07/03/2013 2132   GFRAA 72 07/14/2017 0753   GFRAA >60 07/03/2013 2132    Lipid Panel     Component Value Date/Time   CHOL 212 (H) 06/12/2017 1252   TRIG 60 06/12/2017 1252   HDL 60 06/12/2017 1252   CHOLHDL 3.5 06/12/2017 1252   LDLCALC 140 (H) 06/12/2017 1252    CBC    Component Value Date/Time   WBC 4.2 06/12/2017 1252   WBC 8.6 07/03/2013 2132   RBC 5.11 06/12/2017 1252   RBC 5.47 (H) 07/03/2013 2132   HGB 12.1 06/12/2017 1252   HCT 38.0 06/12/2017 1252   PLT 280 06/12/2017 1252   MCV 74 (L) 06/12/2017 1252   MCV 73 (L) 07/03/2013 2132   MCH 23.7 (L) 06/12/2017 1252   MCH 25.3 (L) 07/03/2013 2132   MCHC 31.8 06/12/2017 1252   MCHC 34.5 07/03/2013 2132   RDW 20.0 (H) 06/12/2017 1252   RDW 19.1 (H) 07/03/2013 2132   LYMPHSABS 2.5 03/17/2015 1618   EOSABS 0.1 03/17/2015 1618   BASOSABS 0.0 03/17/2015 1618    Hgb A1C Lab Results  Component Value Date   HGBA1C 4.9 06/12/2017           Assessment & Plan:   Preventative Health Maintenance:  Flu shot UTD Tetanus UTD Pap smear today with STD screening Mammogram ordered, she will call [facility] to schedule Encouraged her to consume a balanced diet and exercise regimen Advised her to see an eye doctor and dentist annually Will check CBC, CMET, Lipid, and Vit D  today  RTC in 1 year, sooner if needed Webb Silversmith, NP

## 2018-06-05 NOTE — Assessment & Plan Note (Signed)
CMET and Lipid profile today Encouraged her to consume a low fat diet Continue Rosuvastatin for now, will adjust if needed based on labs

## 2018-06-05 NOTE — Assessment & Plan Note (Addendum)
Borderline control on Amlodipine CBC and CMET today Reinforced DASH diet and exercise for weight loss Will monitor for now

## 2018-06-06 ENCOUNTER — Other Ambulatory Visit: Payer: Self-pay | Admitting: Internal Medicine

## 2018-06-06 DIAGNOSIS — E559 Vitamin D deficiency, unspecified: Secondary | ICD-10-CM

## 2018-06-06 LAB — LIPID PANEL
CHOLESTEROL TOTAL: 181 mg/dL (ref 100–199)
Chol/HDL Ratio: 3 ratio (ref 0.0–4.4)
HDL: 61 mg/dL (ref >39)
LDL Calculated: 109 mg/dL — ABNORMAL HIGH (ref 0–99)
Triglycerides: 53 mg/dL (ref 0–149)
VLDL CHOLESTEROL CAL: 11 mg/dL (ref 5–40)

## 2018-06-06 LAB — COMPREHENSIVE METABOLIC PANEL
ALT: 7 IU/L (ref 0–32)
AST: 17 IU/L (ref 0–40)
Albumin/Globulin Ratio: 1 — ABNORMAL LOW (ref 1.2–2.2)
Albumin: 3.5 g/dL (ref 3.5–5.5)
Alkaline Phosphatase: 57 IU/L (ref 39–117)
BUN/Creatinine Ratio: 13 (ref 9–23)
BUN: 15 mg/dL (ref 6–24)
Bilirubin Total: 0.3 mg/dL (ref 0.0–1.2)
CALCIUM: 8.9 mg/dL (ref 8.7–10.2)
CO2: 23 mmol/L (ref 20–29)
CREATININE: 1.15 mg/dL — AB (ref 0.57–1.00)
Chloride: 100 mmol/L (ref 96–106)
GFR calc Af Amer: 68 mL/min/{1.73_m2} (ref >59)
GFR, EST NON AFRICAN AMERICAN: 59 mL/min/{1.73_m2} — AB (ref >59)
Globulin, Total: 3.4 g/dL (ref 1.5–4.5)
Glucose: 83 mg/dL (ref 65–99)
Potassium: 3.6 mmol/L (ref 3.5–5.2)
Sodium: 138 mmol/L (ref 134–144)
Total Protein: 6.9 g/dL (ref 6.0–8.5)

## 2018-06-06 LAB — CBC
Hematocrit: 38.2 % (ref 34.0–46.6)
Hemoglobin: 12 g/dL (ref 11.1–15.9)
MCH: 22.8 pg — ABNORMAL LOW (ref 26.6–33.0)
MCHC: 31.4 g/dL — ABNORMAL LOW (ref 31.5–35.7)
MCV: 73 fL — ABNORMAL LOW (ref 79–97)
PLATELETS: 309 10*3/uL (ref 150–450)
RBC: 5.27 x10E6/uL (ref 3.77–5.28)
RDW: 19.7 % — ABNORMAL HIGH (ref 12.3–15.4)
WBC: 4.5 10*3/uL (ref 3.4–10.8)

## 2018-06-06 LAB — VITAMIN D 25 HYDROXY (VIT D DEFICIENCY, FRACTURES): VIT D 25 HYDROXY: 18.1 ng/mL — AB (ref 30.0–100.0)

## 2018-06-06 MED ORDER — VITAMIN D (ERGOCALCIFEROL) 1.25 MG (50000 UNIT) PO CAPS
50000.0000 [IU] | ORAL_CAPSULE | ORAL | 0 refills | Status: DC
Start: 2018-06-06 — End: 2018-11-08

## 2018-06-08 ENCOUNTER — Other Ambulatory Visit: Payer: Self-pay | Admitting: Internal Medicine

## 2018-06-08 ENCOUNTER — Encounter: Payer: Self-pay | Admitting: Internal Medicine

## 2018-06-11 MED ORDER — LISINOPRIL 10 MG PO TABS: 10.0000 mg | ORAL_TABLET | Freq: Every day | ORAL | 2 refills | Status: DC

## 2018-06-15 ENCOUNTER — Encounter: Payer: Self-pay | Admitting: Internal Medicine

## 2018-06-15 LAB — PAP LB, CT-NG TV HPV-HR
Chlamydia, Nuc. Acid Amp: NEGATIVE
GONOCOCCUS, NUC. ACID AMP: NEGATIVE
HPV, HIGH-RISK: NEGATIVE
PAP Smear Comment: 0
TRICH VAG BY NAA: NEGATIVE

## 2018-06-18 ENCOUNTER — Ambulatory Visit: Payer: Managed Care, Other (non HMO) | Admitting: Internal Medicine

## 2018-06-18 ENCOUNTER — Encounter (HOSPITAL_COMMUNITY): Payer: Self-pay | Admitting: Emergency Medicine

## 2018-06-18 ENCOUNTER — Encounter: Payer: Self-pay | Admitting: Internal Medicine

## 2018-06-18 ENCOUNTER — Emergency Department (HOSPITAL_COMMUNITY)
Admission: EM | Admit: 2018-06-18 | Discharge: 2018-06-19 | Disposition: A | Discharge status: Home / Self Care | Payer: Managed Care, Other (non HMO) | Attending: Emergency Medicine | Admitting: Emergency Medicine

## 2018-06-18 VITALS — BP 132/90 | HR 95 | Temp 98.3°F | Wt 281.0 lb

## 2018-06-18 DIAGNOSIS — I10 Essential (primary) hypertension: Secondary | ICD-10-CM | POA: Diagnosis not present

## 2018-06-18 DIAGNOSIS — R1012 Left upper quadrant pain: Secondary | ICD-10-CM | POA: Diagnosis present

## 2018-06-18 DIAGNOSIS — K5909 Other constipation: Secondary | ICD-10-CM

## 2018-06-18 DIAGNOSIS — Z79899 Other long term (current) drug therapy: Secondary | ICD-10-CM | POA: Insufficient documentation

## 2018-06-18 DIAGNOSIS — R1013 Epigastric pain: Secondary | ICD-10-CM

## 2018-06-18 DIAGNOSIS — R1114 Bilious vomiting: Secondary | ICD-10-CM | POA: Diagnosis not present

## 2018-06-18 DIAGNOSIS — R112 Nausea with vomiting, unspecified: Secondary | ICD-10-CM | POA: Insufficient documentation

## 2018-06-18 DIAGNOSIS — K529 Noninfective gastroenteritis and colitis, unspecified: Secondary | ICD-10-CM | POA: Insufficient documentation

## 2018-06-18 LAB — LIPASE, BLOOD: LIPASE: 25 U/L (ref 11–51)

## 2018-06-18 LAB — COMPREHENSIVE METABOLIC PANEL
ALBUMIN: 3.1 g/dL — AB (ref 3.5–5.0)
ALT: 9 U/L (ref 0–44)
ANION GAP: 8 (ref 5–15)
AST: 18 U/L (ref 15–41)
Alkaline Phosphatase: 50 U/L (ref 38–126)
BILIRUBIN TOTAL: 0.5 mg/dL (ref 0.3–1.2)
BUN: 20 mg/dL (ref 6–20)
CALCIUM: 8.8 mg/dL — AB (ref 8.9–10.3)
CO2: 24 mmol/L (ref 22–32)
Chloride: 106 mmol/L (ref 98–111)
Creatinine, Ser: 1.33 mg/dL — ABNORMAL HIGH (ref 0.44–1.00)
GFR calc Af Amer: 56 mL/min — ABNORMAL LOW (ref >60)
GFR, EST NON AFRICAN AMERICAN: 49 mL/min — AB (ref >60)
GLUCOSE: 118 mg/dL — AB (ref 70–99)
POTASSIUM: 3.6 mmol/L (ref 3.5–5.1)
Sodium: 138 mmol/L (ref 135–145)
Total Protein: 7.1 g/dL (ref 6.5–8.1)

## 2018-06-18 LAB — URINALYSIS, ROUTINE W REFLEX MICROSCOPIC
BILIRUBIN URINE: NEGATIVE
Glucose, UA: NEGATIVE mg/dL
KETONES UR: 5 mg/dL — AB
Nitrite: NEGATIVE
PROTEIN: 100 mg/dL — AB
Specific Gravity, Urine: 1.027 (ref 1.005–1.030)
pH: 5 (ref 5.0–8.0)

## 2018-06-18 LAB — CBC
HCT: 42 % (ref 36.0–46.0)
HEMOGLOBIN: 13.9 g/dL (ref 12.0–15.0)
MCH: 23 pg — AB (ref 26.0–34.0)
MCHC: 33.1 g/dL (ref 30.0–36.0)
MCV: 69.4 fL — AB (ref 80.0–100.0)
Platelets: 284 10*3/uL (ref 150–400)
RBC: 6.05 MIL/uL — AB (ref 3.87–5.11)
RDW: 19 % — ABNORMAL HIGH (ref 11.5–15.5)
WBC: 7.7 10*3/uL (ref 4.0–10.5)
nRBC: 0 % (ref 0.0–0.2)

## 2018-06-18 LAB — I-STAT BETA HCG BLOOD, ED (MC, WL, AP ONLY)

## 2018-06-18 MED ORDER — ONDANSETRON HCL 4 MG PO TABS: 4.0000 mg | ORAL_TABLET | Freq: Three times a day (TID) | ORAL | 0 refills | Status: DC | PRN

## 2018-06-18 NOTE — ED Triage Notes (Signed)
Pt presents with abd pain since Sunday morning with n/v and constipation; took enema with some success but still feels "full"; pt reports has had this in the past but dx with gastritis; hx of gastric sleeve also

## 2018-06-18 NOTE — Patient Instructions (Signed)
Vomiting, Adult  Vomiting occurs when stomach contents are thrown up and out of the mouth. Many people notice nausea before vomiting. Vomiting can make you feel weak and dehydrated. Dehydration can make you tired and thirsty, cause you to have a dry mouth, and decrease how often you urinate. Older adults and people who have other diseases or a weak immune system are at higher risk for dehydration.It is important to treat vomiting as told by your health care provider.  Follow these instructions at home:  Follow your health care provider's instructions about how to care for yourself at home.  Eating and drinking  Follow these recommendations as told by your health care provider:   Take an oral rehydration solution (ORS). This is a drink that is sold at pharmacies and retail stores.   Eat bland, easy-to-digest foods in small amounts as you are able. These foods include bananas, applesauce, rice, lean meats, toast, and crackers.   Drink clear fluids in small amounts as you are able. Clear fluids include water, ice chips, low-calorie sports drinks, and fruit juice that has water added (diluted fruit juice).   Avoid fluids that contain a lot of sugar or caffeine.   Avoid alcohol and foods that are spicy or fatty.    General instructions     Wash your hands frequently with soap and water. If soap and water are not available, use hand sanitizer. Make sure that everyone in your household washes their hands frequently.   Take over-the-counter and prescription medicines only as told by your health care provider.   Watch your condition for any changes.   Keep all follow-up visits as told by your health care provider. This is important.  Contact a health care provider if:   You have a fever.   You are not able to keep fluids down.   Your vomiting gets worse.   You have new symptoms.   You feel light-headed or dizzy.   You have a headache.   You have muscle cramps.  Get help right away if:   You have pain in  your chest, neck, arm, or jaw.   You feel extremely weak or you faint.   You have persistent vomiting.   You have vomit that is bright red or looks like black coffee grounds.   You have stools that are bloody or black, or stools that look like tar.   You have severe pain, cramping, or bloating in your abdomen.   You have a severe headache, a stiff neck, or both.   You have a rash.   You have trouble breathing or you are breathing very quickly.   Your heart is beating very quickly.   Your skin feels cold and clammy.   You feel confused.   You have pain while urinating.   You have signs of dehydration, such as:  ? Dark urine, or very little or no urine.  ? Cracked lips.  ? Dry mouth.  ? Sunken eyes.  ? Sleepiness.  ? Weakness.  These symptoms may represent a serious problem that is an emergency. Do not wait to see if the symptoms will go away. Get medical help right away. Call your local emergency services (911 in the [place] Do not drive yourself to the hospital.  This information is not intended to replace advice given to you by your health care provider. Make sure you discuss any questions you have with your health care provider.  Document Released: 09/11/2015 Document Revised: 01/21/2016 Document   Reviewed: 04/21/2015  Elsevier Interactive Patient Education  2018 [employer]

## 2018-06-18 NOTE — Progress Notes (Signed)
Subjective:    Patient ID: Margaret Carlson, female    DOB: 1976/04/29, 42 y.o.   MRN: [id number]  HPI  Pt presents to the clinic today with c/o abdominal pain, vomiting and constipation. She reports this started yesterday morning. She reports she went out to [employer] Saturday night. She wasn't feeling well so she didn't eat much, just some chicken and broccoli. She went home, had a little more chicken from her leftovers. She woke up at 4 am with abdominal pain, nausea and vomiting. She denies blood in her vomit. She stopped vomiting about 9 pm last night. She has felt a little constipated so she uses a Fleets enema with good results. She denies blood in her stool. She has also tried [employer], but reports she was not able to keep that down. She denies fever. She has not had sick contacts. She denies recent change in diet or medications.  Review of Systems      Past Medical History:  Diagnosis Date  . Hypertension     Current Outpatient Medications  Medication Sig Dispense Refill  . acyclovir (ZOVIRAX) 400 MG tablet Take 400 mg by mouth daily as needed.    Marland Kitchen amLODipine (NORVASC) 10 MG tablet Take 1 tablet (10 mg total) by mouth daily. MUST SCHEDULE ANNUAL EXAM FOR October 90 tablet 0  . Calcium-Vitamin D-Vitamin K (CALCIUM + D + K PO) Take 1 capsule by mouth daily.    . cyanocobalamin 2000 MCG tablet Take 2,000 mcg by mouth daily.    Marland Kitchen lisinopril (PRINIVIL,ZESTRIL) 10 MG tablet Take 1 tablet (10 mg total) by mouth daily. 30 tablet 2  . Multiple Vitamin (MULTIVITAMIN) tablet Take 1 tablet by mouth daily.    . Norethindrone Acetate-Ethinyl Estradiol (JUNEL 1.5/30) 1.5-30 MG-MCG tablet TAKE 1 TABLET BY MOUTH  EVERY DAY FOR 3 WEEKS THEN  OFF FOR 1 WEEK 63 tablet 2  . PREVIFEM 0.25-35 MG-MCG tablet Take 1 tablet daily by mouth. 1 Package 1  . rosuvastatin (CRESTOR) 10 MG tablet TAKE 1 TABLET BY MOUTH  DAILY 90 tablet 0  . Vitamin D, Ergocalciferol, (DRISDOL) 50000 units CAPS capsule Take  1 capsule (50,000 Units total) by mouth every 7 (seven) days. 12 capsule 0  . ondansetron (ZOFRAN) 4 MG tablet Take 1 tablet (4 mg total) by mouth every 8 (eight) hours as needed. 20 tablet 0   No current facility-administered medications for this visit.     Allergies  Allergen Reactions  . Ranitidine Swelling    Face and lip swelling    Family History  Problem Relation Age of Onset  . Arthritis Maternal Grandmother   . Hypertension Maternal Grandmother   . Breast cancer Maternal Grandmother 77    Social History   Socioeconomic History  . Marital status: Single    Spouse name: Not on file  . Number of children: Not on file  . Years of education: Not on file  . Highest education level: Not on file  Occupational History  . Not on file  Social Needs  . Financial resource strain: Not on file  . Food insecurity:    Worry: Not on file    Inability: Not on file  . Transportation needs:    Medical: Not on file    Non-medical: Not on file  Tobacco Use  . Smoking status: Never Smoker  . Smokeless tobacco: Never Used  Substance and Sexual Activity  . Alcohol use: No    Alcohol/week: 0.0 standard drinks  .  Drug use: No  . Sexual activity: Yes  Lifestyle  . Physical activity:    Days per week: Not on file    Minutes per session: Not on file  . Stress: Not on file  Relationships  . Social connections:    Talks on phone: Not on file    Gets together: Not on file    Attends religious service: Not on file    Active member of club or organization: Not on file    Attends meetings of clubs or organizations: Not on file    Relationship status: Not on file  . Intimate partner violence:    Fear of current or ex partner: Not on file    Emotionally abused: Not on file    Physically abused: Not on file    Forced sexual activity: Not on file  Other Topics Concern  . Not on file  Social History Narrative  . Not on file     Constitutional: Denies fever, malaise, fatigue,  headache or abrupt weight changes.  Respiratory: Denies difficulty breathing, shortness of breath, cough or sputum production.   Cardiovascular: Denies chest pain, chest tightness, palpitations or swelling in the hands or feet.  Gastrointestinal: Pt reports nausea, vomiting, constipation and abdominal pain. Denies bloating, diarrhea or blood in the stool.  GU: Denies urgency, frequency, pain with urination, burning sensation, blood in urine, odor or discharge.   No other specific complaints in a complete review of systems (except as listed in HPI above).  Objective:   Physical Exam   BP 132/90   Pulse 95   Temp 98.3 F (36.8 C) (Oral)   Wt 281 lb (127.5 kg)   LMP 05/29/2018   SpO2 98%   BMI 46.76 kg/m  Wt Readings from Last 3 Encounters:  06/18/18 281 lb (127.5 kg)  06/05/18 281 lb (127.5 kg)  01/16/18 281 lb (127.5 kg)    General: Appears her stated age, obese, in NAD. Cardiovascular: Normal rate and rhythm.  Pulmonary/Chest: Normal effort and positive vesicular breath sounds. No respiratory distress. No wheezes, rales or ronchi noted.  Abdomen: Soft and tender in the epigastric region. Hypoactive bowel sounds. No distention or masses noted.  Neurological: Alert and oriented.   BMET    Component Value Date/Time   NA 138 06/05/2018 1217   NA 137 07/03/2013 2132   K 3.6 06/05/2018 1217   K 3.6 07/03/2013 2132   CL 100 06/05/2018 1217   CL 103 07/03/2013 2132   CO2 23 06/05/2018 1217   CO2 29 07/03/2013 2132   GLUCOSE 83 06/05/2018 1217   GLUCOSE 90 07/03/2013 2132   BUN 15 06/05/2018 1217   BUN 9 07/03/2013 2132   CREATININE 1.15 (H) 06/05/2018 1217   CREATININE 0.85 07/03/2013 2132   CALCIUM 8.9 06/05/2018 1217   CALCIUM 9.0 07/03/2013 2132   GFRNONAA 59 (L) 06/05/2018 1217   GFRNONAA >60 07/03/2013 2132   GFRAA 68 06/05/2018 1217   GFRAA >60 07/03/2013 2132    Lipid Panel     Component Value Date/Time   CHOL 181 06/05/2018 1217   TRIG 53 06/05/2018 1217    HDL 61 06/05/2018 1217   CHOLHDL 3.0 06/05/2018 1217   LDLCALC 109 (H) 06/05/2018 1217    CBC    Component Value Date/Time   WBC 4.5 06/05/2018 1217   WBC 8.6 07/03/2013 2132   RBC 5.27 06/05/2018 1217   RBC 5.47 (H) 07/03/2013 2132   HGB 12.0 06/05/2018 1217   HCT 38.2  06/05/2018 1217   PLT 309 06/05/2018 1217   MCV 73 (L) 06/05/2018 1217   MCV 73 (L) 07/03/2013 2132   MCH 22.8 (L) 06/05/2018 1217   MCH 25.3 (L) 07/03/2013 2132   MCHC 31.4 (L) 06/05/2018 1217   MCHC 34.5 07/03/2013 2132   RDW 19.7 (H) 06/05/2018 1217   RDW 19.1 (H) 07/03/2013 2132   LYMPHSABS 2.5 03/17/2015 1618   EOSABS 0.1 03/17/2015 1618   BASOSABS 0.0 03/17/2015 1618    Hgb A1C Lab Results  Component Value Date   HGBA1C 4.9 06/12/2017           Assessment & Plan:   Nausea, Vomiting, Epigastric Pain and Constipation:  Low suspicion for bowel obstruction ? R/t what she ate at [employer] the night before Abdominal pain, vomiting and constipation have resolved Still slightly nauseated eRx for Zofran 4 mg Q8H prn Start Prilosec OTC x 14 days Work note provided  Return precautions discussed Webb Silversmith, NP

## 2018-06-19 ENCOUNTER — Emergency Department (HOSPITAL_COMMUNITY): Payer: Managed Care, Other (non HMO)

## 2018-06-19 ENCOUNTER — Telehealth: Payer: Self-pay | Admitting: *Deleted

## 2018-06-19 MED ORDER — IOPAMIDOL (ISOVUE-300) INJECTION 61%: 100.0000 mL | Freq: Once | INTRAVENOUS | Status: DC | PRN

## 2018-06-19 MED ORDER — IOHEXOL 300 MG/ML  SOLN
100.0000 mL | Freq: Once | INTRAMUSCULAR | Status: AC | PRN
  Administered 2018-06-19: 100 mL via INTRAVENOUS

## 2018-06-19 MED ORDER — OXYCODONE-ACETAMINOPHEN 5-325 MG PO TABS
1.0000 | ORAL_TABLET | Freq: Once | ORAL | Status: AC
  Administered 2018-06-19: 1 via ORAL
  Filled 2018-06-19: qty 1

## 2018-06-19 MED ORDER — HYDROCODONE-ACETAMINOPHEN 5-325 MG PO TABS: 1.0000 | ORAL_TABLET | Freq: Four times a day (QID) | ORAL | 0 refills | Status: DC | PRN

## 2018-06-19 NOTE — Discharge Instructions (Addendum)
Continue your medication for nausea as needed and take Norco if needed for severe pain. Follow up with your doctor for recheck in 2 days to insure improvement. Follow up with [facility] for recheck of gastric sleeve.

## 2018-06-19 NOTE — ED Provider Notes (Signed)
[facility] EMERGENCY DEPARTMENT Provider Note   CSN: [id number] Arrival date & time: 06/18/18  1649     History   Chief Complaint Chief Complaint  Patient presents with  . Abdominal Pain  . Nausea    HPI Margaret Carlson is a 42 y.o. female.  Patient presents with complaint of LUQ abdominal pain that started as nausea on 06/16/18, pain on 06/17/18 with vomiting. Pain has been persistent since onset without aggravating or alleviating factors. No further nausea or vomiting. No fever. She does not feel she is constipated and there has been no diarrhea. No history of similar pain. She does not use NSAIDs regularly. She has a history of gastric sleeve.  The history is provided by the patient. No language interpreter was used.    Past Medical History:  Diagnosis Date  . Hypertension     Patient Active Problem List   Diagnosis Date Noted  . Mixed hyperlipidemia 06/14/2016  . HTN (hypertension) 01/15/2015    Past Surgical History:  Procedure Laterality Date  . 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 Authorizing Provider  amLODipine (NORVASC) 10 MG tablet Take 1 tablet (10 mg total) by mouth daily. MUST SCHEDULE ANNUAL EXAM FOR October 04/27/18  Yes Jearld Fenton, NP  Calcium-Vitamin D-Vitamin K (CALCIUM + D + K PO) Take 1 capsule by mouth daily.   Yes [provider]  cyanocobalamin 2000 MCG tablet Take 2,000 mcg by mouth daily.   Yes [provider]  lisinopril (PRINIVIL,ZESTRIL) 10 MG tablet Take 1 tablet (10 mg total) by mouth daily. 06/11/18  Yes Jearld Fenton, NP  Multiple Vitamin (MULTIVITAMIN) tablet Take 1 tablet by mouth daily.   Yes [provider]  Norethindrone Acetate-Ethinyl Estradiol (JUNEL 1.5/30) 1.5-30 MG-MCG tablet TAKE 1 TABLET BY MOUTH  EVERY DAY FOR 3 WEEKS THEN  OFF FOR 1  WEEK Patient taking differently: Take 1 tablet by mouth daily.  06/09/18  Yes Baity, Coralie Keens, NP  ondansetron (ZOFRAN) 4 MG tablet Take 1 tablet (4 mg total) by mouth every 8 (eight) hours as needed. 06/18/18  Yes Jearld Fenton, NP  PREVIFEM 0.25-35 MG-MCG tablet Take 1 tablet daily by mouth. 07/11/17  Yes Baity, Coralie Keens, NP  rosuvastatin (CRESTOR) 10 MG tablet TAKE 1 TABLET BY MOUTH  DAILY Patient taking differently: Take 10 mg by mouth daily.  05/29/18  Yes Baity, Coralie Keens, NP  Vitamin D, Ergocalciferol, (DRISDOL) 50000 units CAPS capsule Take 1 capsule (50,000 Units total) by mouth every 7 (seven) days. 06/06/18  Yes Jearld Fenton, NP    Family History Family History  Problem Relation Age of Onset  . Arthritis Maternal Grandmother   . Hypertension Maternal Grandmother   . Breast cancer Maternal Grandmother 77    Social History Social History   Tobacco Use  . Smoking status: Never Smoker  . Smokeless tobacco: Never Used  Substance Use Topics  . Alcohol use: No    Alcohol/week: 0.0 standard drinks  . Drug use: No     Allergies   Ranitidine   Review of Systems Review of Systems  Constitutional: Negative for chills and fever.  HENT: Negative.   Respiratory: Negative.  Negative for shortness of breath.   Cardiovascular: Negative.  Negative for chest pain.  Gastrointestinal: Positive for  abdominal pain, nausea and vomiting. Negative for constipation and diarrhea.  Genitourinary: Negative.   Musculoskeletal: Negative.   Skin: Negative.   Neurological: Negative.      Physical Exam Updated Vital Signs BP (!) 148/108   Pulse 84   Temp 99 F (37.2 C) (Oral)   Resp 18   LMP 05/29/2018   SpO2 95%   Physical Exam  Constitutional: She appears well-developed and well-nourished.  HENT:  Head: Normocephalic.  Neck: Normal range of motion. Neck supple.  Cardiovascular: Normal rate and regular rhythm.  No murmur heard. Pulmonary/Chest: Effort normal and breath  sounds normal.  Abdominal: Soft. Bowel sounds are normal. She exhibits no distension. There is tenderness in the left upper quadrant. There is no rebound and no guarding.  Musculoskeletal: Normal range of motion.  Neurological: She is alert. No cranial nerve deficit.  Skin: Skin is warm and dry. No rash noted.  Psychiatric: She has a normal mood and affect.  Nursing note and vitals reviewed.    ED Treatments / Results  Labs (all labs ordered are listed, but only abnormal results are displayed) Labs Reviewed  COMPREHENSIVE METABOLIC PANEL - Abnormal; Notable for the following components:      Result Value   Glucose, Bld 118 (*)    Creatinine, Ser 1.33 (*)    Calcium 8.8 (*)    Albumin 3.1 (*)    GFR calc non Af Amer 49 (*)    GFR calc Af Amer 56 (*)    All other components within normal limits  CBC - Abnormal; Notable for the following components:   RBC 6.05 (*)    MCV 69.4 (*)    MCH 23.0 (*)    RDW 19.0 (*)    All other components within normal limits  URINALYSIS, ROUTINE W REFLEX MICROSCOPIC - Abnormal; Notable for the following components:   APPearance HAZY (*)    Hgb urine dipstick LARGE (*)    Ketones, ur 5 (*)    Protein, ur 100 (*)    Leukocytes, UA TRACE (*)    Bacteria, UA RARE (*)    All other components within normal limits  LIPASE, BLOOD  I-STAT BETA HCG BLOOD, ED ([facility], [facility], [facility] ONLY)   Results for orders placed or performed during the hospital encounter of 06/18/18  Lipase, blood  Result Value Ref Range   Lipase 25 11 - 51 U/L  Comprehensive metabolic panel  Result Value Ref Range   Sodium 138 135 - 145 mmol/L   Potassium 3.6 3.5 - 5.1 mmol/L   Chloride 106 98 - 111 mmol/L   CO2 24 22 - 32 mmol/L   Glucose, Bld 118 (H) 70 - 99 mg/dL   BUN 20 6 - 20 mg/dL   Creatinine, Ser 1.33 (H) 0.44 - 1.00 mg/dL   Calcium 8.8 (L) 8.9 - 10.3 mg/dL   Total Protein 7.1 6.5 - 8.1 g/dL   Albumin 3.1 (L) 3.5 - 5.0 g/dL   AST 18 15 - 41 U/L   ALT 9 0 - 44 U/L   Alkaline  Phosphatase 50 38 - 126 U/L   Total Bilirubin 0.5 0.3 - 1.2 mg/dL   GFR calc non Af Amer 49 (L) >60 mL/min   GFR calc Af Amer 56 (L) >60 mL/min   Anion gap 8 5 - 15  CBC  Result Value Ref Range   WBC 7.7 4.0 - 10.5 K/uL   RBC 6.05 (H) 3.87 - 5.11 MIL/uL   Hemoglobin 13.9 12.0 -  15.0 g/dL   HCT 42.0 36.0 - 46.0 %   MCV 69.4 (L) 80.0 - 100.0 fL   MCH 23.0 (L) 26.0 - 34.0 pg   MCHC 33.1 30.0 - 36.0 g/dL   RDW 19.0 (H) 11.5 - 15.5 %   Platelets 284 150 - 400 K/uL   nRBC 0.0 0.0 - 0.2 %  Urinalysis, Routine w reflex microscopic  Result Value Ref Range   Color, Urine YELLOW YELLOW   APPearance HAZY (A) CLEAR   Specific Gravity, Urine 1.027 1.005 - 1.030   pH 5.0 5.0 - 8.0   Glucose, UA NEGATIVE NEGATIVE mg/dL   Hgb urine dipstick LARGE (A) NEGATIVE   Bilirubin Urine NEGATIVE NEGATIVE   Ketones, ur 5 (A) NEGATIVE mg/dL   Protein, ur 100 (A) NEGATIVE mg/dL   Nitrite NEGATIVE NEGATIVE   Leukocytes, UA TRACE (A) NEGATIVE   RBC / HPF 0-5 0 - 5 RBC/hpf   WBC, UA 0-5 0 - 5 WBC/hpf   Bacteria, UA RARE (A) NONE SEEN   Squamous Epithelial / LPF 6-10 0 - 5   Mucus PRESENT    Hyaline Casts, UA PRESENT   I-Stat beta hCG blood, ED  Result Value Ref Range   I-stat hCG, quantitative <5.0 <5 mIU/mL   Comment 3             EKG None  Radiology Dg Abdomen Acute W/chest  Result Date: 06/19/2018 CLINICAL DATA:  Abdominal pain. Nausea and vomiting. History of surgeries. Patient reports constipation. EXAM: DG ABDOMEN ACUTE W/ 1V CHEST COMPARISON:  Radiographs 05/26/2015 FINDINGS: Low lung volumes. Mild right basilar atelectasis. No confluent airspace disease. Normal heart size and mediastinal contours. No bowel dilatation to suggest obstruction. Small volume of stool in the ascending and transverse colon. Generalized paucity of bowel gas in the lower abdomen and pelvis, nonspecific. No visualized radiopaque calculi. No acute osseous abnormalities. IMPRESSION: 1. Nonobstructive bowel gas pattern.  Generalized paucity of bowel gas in the lower abdomen and pelvis is nonspecific, and may be a normal finding. 2. No increased stool burden to suggest constipation. 3. Low lung volumes with right basilar atelectasis. Electronically Signed   By: Keith Rake M.D.   On: 06/19/2018 04:26    Procedures Procedures (including critical care time)  Medications Ordered in ED Medications  oxyCODONE-acetaminophen (PERCOCET/ROXICET) 5-325 MG per tablet 1 tablet (1 tablet Oral Given 06/19/18 0401)     Initial Impression / Assessment and Plan / ED Course  I have reviewed the triage vital signs and the nursing notes.  Pertinent labs & imaging results that were available during my care of the patient were reviewed by me and considered in my medical decision making (see chart for details).     Patient with abdominal pain focal to the LUQ x 3 days, initially associated with nausea and vomiting. No fever, diarrhea or constipation. History of gastric sleeve.   Labs are unremarkable. Plain film of abdomen has a normal BGP, no significant constipation. She is given pain medication with some relief of symptoms.   CT scan ordered for definitive evaluation secondary to previous gastric sleeve surgery to rule out associated complication as the source of her pain.  CT scan shows "small bowel inflammation vs abdominopelvic ascites". Ct reviewed with general surgery who does not feel the gastric sleeve is affected.  Re-evaluation finds the patient comfortable, pain gone, tolerating PO fluids without recurrent pain or nausea.   She had similar symptoms in the past with comparable CT scan in  2014, during the last similar episode. The patient states tests were done but nothing was diagnosed.   She is encouraged to follow up with [facility] where her surgery was done for recheck to insure symptoms improve and resolve.   Final Clinical Impressions(s) / ED Diagnoses   Final diagnoses:  None   1. Abdominal pain 2.  Enteritis   ED Discharge Orders    None       Charlann Lange, Hershal Coria 06/19/18 [id number]    Ezequiel Essex, MD 06/20/18 [phone]

## 2018-06-19 NOTE — Telephone Encounter (Signed)
No TCM required per PCP. Pt was seen prior to ED

## 2018-06-25 ENCOUNTER — Ambulatory Visit
Admission: RE | Admit: 2018-06-25 | Discharge: 2018-06-25 | Disposition: A | Discharge status: Home / Self Care | Payer: Managed Care, Other (non HMO) | Source: Ambulatory Visit | Attending: Internal Medicine | Admitting: Internal Medicine

## 2018-06-25 DIAGNOSIS — Z1239 Encounter for other screening for malignant neoplasm of breast: Secondary | ICD-10-CM | POA: Insufficient documentation

## 2018-06-26 ENCOUNTER — Ambulatory Visit: Payer: Managed Care, Other (non HMO) | Admitting: Internal Medicine

## 2018-06-26 ENCOUNTER — Encounter: Payer: Self-pay | Admitting: Internal Medicine

## 2018-06-26 VITALS — BP 142/90 | HR 71 | Temp 98.2°F | Wt 280.0 lb

## 2018-06-26 DIAGNOSIS — R142 Eructation: Secondary | ICD-10-CM | POA: Diagnosis not present

## 2018-06-26 DIAGNOSIS — R1114 Bilious vomiting: Secondary | ICD-10-CM | POA: Diagnosis not present

## 2018-06-26 DIAGNOSIS — R1012 Left upper quadrant pain: Secondary | ICD-10-CM | POA: Diagnosis not present

## 2018-06-26 DIAGNOSIS — R140 Abdominal distension (gaseous): Secondary | ICD-10-CM | POA: Diagnosis not present

## 2018-06-26 DIAGNOSIS — I10 Essential (primary) hypertension: Secondary | ICD-10-CM

## 2018-06-26 MED ORDER — LOSARTAN POTASSIUM 50 MG PO TABS: 50.0000 mg | ORAL_TABLET | Freq: Every day | ORAL | 2 refills | Status: DC

## 2018-06-26 MED ORDER — LOSARTAN POTASSIUM 25 MG PO TABS: 25.0000 mg | ORAL_TABLET | Freq: Every day | ORAL | 2 refills | Status: DC

## 2018-06-26 NOTE — Patient Instructions (Signed)

## 2018-06-26 NOTE — Progress Notes (Signed)
Subjective:    Patient ID: Margaret Carlson, female    DOB: 1976/07/26, 42 y.o.   MRN: [id number]  HPI  Pt presents to the clinic today to follow up HTN. At her last visit, she was started on Lisinopril. She took it for a few days until she noticed swelling of her bottom lip. She denied swelling of her tongue, throat and denies difficulty breathing. She stopped the medication and symptoms resolved. She is currently only taking Amlodipine. Her BP today is 142/90.  She is also due for ER follow up. She went to the ER 10/21 with c/o abdominal pain, nausea and vomiting. KUB was normal. CT scan showed:  IMPRESSION: 1. Moderate length small bowel wall thickening and inflammatory change with associated abdominopelvic ascites. This may be secondary to enteritis or intestinal angioedema. Similar findings were seen on 2014 CT. No perforation or obstruction. 2. Layering stones/sludge in the gallbladder.  Surgery was consulted and felt like this was not related to her gastric sleeve. She was treated with Percocet with good improvement. She was discharged with a RX for Vicodin and advised to follow up with her bariatric surgeon. Since discharge, she reports the nausea and vomiting have subsided. She continues to have intermittent LUQ pain, gas, bloating. She feels like her bowels are moving normally. She is taking Prilosec OTC with no improvement.  Review of Systems      Past Medical History:  Diagnosis Date  . Hypertension     Current Outpatient Medications  Medication Sig Dispense Refill  . amLODipine (NORVASC) 10 MG tablet Take 1 tablet (10 mg total) by mouth daily. MUST SCHEDULE ANNUAL EXAM FOR October 90 tablet 0  . Calcium-Vitamin D-Vitamin K (CALCIUM + D + K PO) Take 1 capsule by mouth daily.    . cyanocobalamin 2000 MCG tablet Take 2,000 mcg by mouth daily.    Marland Kitchen HYDROcodone-acetaminophen (NORCO/VICODIN) 5-325 MG tablet Take 1-2 tablets by mouth every 6 (six) hours as needed. 12 tablet 0    . Multiple Vitamin (MULTIVITAMIN) tablet Take 1 tablet by mouth daily.    . Norethindrone Acetate-Ethinyl Estradiol (JUNEL 1.5/30) 1.5-30 MG-MCG tablet TAKE 1 TABLET BY MOUTH  EVERY DAY FOR 3 WEEKS THEN  OFF FOR 1 WEEK (Patient taking differently: Take 1 tablet by mouth daily. ) 63 tablet 2  . ondansetron (ZOFRAN) 4 MG tablet Take 1 tablet (4 mg total) by mouth every 8 (eight) hours as needed. 20 tablet 0  . PREVIFEM 0.25-35 MG-MCG tablet Take 1 tablet daily by mouth. 1 Package 1  . rosuvastatin (CRESTOR) 10 MG tablet TAKE 1 TABLET BY MOUTH  DAILY (Patient taking differently: Take 10 mg by mouth daily. ) 90 tablet 0  . Vitamin D, Ergocalciferol, (DRISDOL) 50000 units CAPS capsule Take 1 capsule (50,000 Units total) by mouth every 7 (seven) days. 12 capsule 0   No current facility-administered medications for this visit.     Allergies  Allergen Reactions  . Ranitidine Swelling    Face and lip swelling    Family History  Problem Relation Age of Onset  . Arthritis Maternal Grandmother   . Hypertension Maternal Grandmother   . Breast cancer Maternal Grandmother 77    Social History   Socioeconomic History  . Marital status: Single    Spouse name: Not on file  . Number of children: Not on file  . Years of education: Not on file  . Highest education level: Not on file  Occupational History  . Not on  file  Social Needs  . Financial resource strain: Not on file  . Food insecurity:    Worry: Not on file    Inability: Not on file  . Transportation needs:    Medical: Not on file    Non-medical: Not on file  Tobacco Use  . Smoking status: Never Smoker  . Smokeless tobacco: Never Used  Substance and Sexual Activity  . Alcohol use: No    Alcohol/week: 0.0 standard drinks  . Drug use: No  . Sexual activity: Yes  Lifestyle  . Physical activity:    Days per week: Not on file    Minutes per session: Not on file  . Stress: Not on file  Relationships  . Social connections:     Talks on phone: Not on file    Gets together: Not on file    Attends religious service: Not on file    Active member of club or organization: Not on file    Attends meetings of clubs or organizations: Not on file    Relationship status: Not on file  . Intimate partner violence:    Fear of current or ex partner: Not on file    Emotionally abused: Not on file    Physically abused: Not on file    Forced sexual activity: Not on file  Other Topics Concern  . Not on file  Social History Narrative  . Not on file     Constitutional: Denies fever, malaise, fatigue, headache or abrupt weight changes.  Respiratory: Denies difficulty breathing, shortness of breath, cough or sputum production.   Cardiovascular: Denies chest pain, chest tightness, palpitations or swelling in the hands or feet.  Gastrointestinal: Pt reports abdominal pain, bloating. Denies constipation, diarrhea or blood in the stool.  GU: Denies urgency, frequency, pain with urination, burning sensation, blood in urine, odor or discharge.   No other specific complaints in a complete review of systems (except as listed in HPI above).  Objective:   Physical Exam  BP (!) 142/90   Pulse 71   Temp 98.2 F (36.8 C) (Oral)   Wt 280 lb (127 kg)   LMP 06/19/2018   SpO2 98%   BMI 46.59 kg/m  Wt Readings from Last 3 Encounters:  06/26/18 280 lb (127 kg)  06/18/18 281 lb (127.5 kg)  06/05/18 281 lb (127.5 kg)    General: Appears her stated age, obese, in NAD. Cardiovascular: Normal rate and rhythm. S1,S2 noted.  No murmur, rubs or gallops noted.  Pulmonary/Chest: Normal effort and positive vesicular breath sounds. No respiratory distress. No wheezes, rales or ronchi noted.  Abdomen: Soft and mildly tender in the epigastric/LUQ area. Normal bowel sounds. No distention or masses noted.  Neurological: Alert and oriented.   BMET    Component Value Date/Time   NA 138 06/18/2018 1756   NA 138 06/05/2018 1217   NA 137  07/03/2013 2132   K 3.6 06/18/2018 1756   K 3.6 07/03/2013 2132   CL 106 06/18/2018 1756   CL 103 07/03/2013 2132   CO2 24 06/18/2018 1756   CO2 29 07/03/2013 2132   GLUCOSE 118 (H) 06/18/2018 1756   GLUCOSE 90 07/03/2013 2132   BUN 20 06/18/2018 1756   BUN 15 06/05/2018 1217   BUN 9 07/03/2013 2132   CREATININE 1.33 (H) 06/18/2018 1756   CREATININE 0.85 07/03/2013 2132   CALCIUM 8.8 (L) 06/18/2018 1756   CALCIUM 9.0 07/03/2013 2132   GFRNONAA 49 (L) 06/18/2018 1756   GFRNONAA >  60 07/03/2013 2132   GFRAA 56 (L) 06/18/2018 1756   GFRAA >60 07/03/2013 2132    Lipid Panel     Component Value Date/Time   CHOL 181 06/05/2018 1217   TRIG 53 06/05/2018 1217   HDL 61 06/05/2018 1217   CHOLHDL 3.0 06/05/2018 1217   LDLCALC 109 (H) 06/05/2018 1217    CBC    Component Value Date/Time   WBC 7.7 06/18/2018 1756   RBC 6.05 (H) 06/18/2018 1756   HGB 13.9 06/18/2018 1756   HGB 12.0 06/05/2018 1217   HCT 42.0 06/18/2018 1756   HCT 38.2 06/05/2018 1217   PLT 284 06/18/2018 1756   PLT 309 06/05/2018 1217   MCV 69.4 (L) 06/18/2018 1756   MCV 73 (L) 06/05/2018 1217   MCV 73 (L) 07/03/2013 2132   MCH 23.0 (L) 06/18/2018 1756   MCHC 33.1 06/18/2018 1756   RDW 19.0 (H) 06/18/2018 1756   RDW 19.7 (H) 06/05/2018 1217   RDW 19.1 (H) 07/03/2013 2132   LYMPHSABS 2.5 03/17/2015 1618   EOSABS 0.1 03/17/2015 1618   BASOSABS 0.0 03/17/2015 1618    Hgb A1C Lab Results  Component Value Date   HGBA1C 4.9 06/12/2017            Assessment & Plan:   ER Follow UP for LUQ Pain, Nausea, Vomiting, Bloating:  ER notes, labs and imaging reviewed Repeat CMET, check H Pylori today Continue Omeprezole OTC May need referral to GI for further evaluation She will follow up with her bariatric surgeon  HTN:  Remains uncontrolled Will add Losartan to Amlodipine Reinforced DASH diet and exercise for weight loss  RTC in 2 weeks for HTN followup Webb Silversmith, NP

## 2018-06-28 LAB — COMPREHENSIVE METABOLIC PANEL
A/G RATIO: 1.3 (ref 1.2–2.2)
ALK PHOS: 55 IU/L (ref 39–117)
ALT: 8 IU/L (ref 0–32)
AST: 18 IU/L (ref 0–40)
Albumin: 3.8 g/dL (ref 3.5–5.5)
BUN/Creatinine Ratio: 13 (ref 9–23)
BUN: 16 mg/dL (ref 6–24)
Bilirubin Total: <0.2 mg/dL (ref 0.0–1.2)
CALCIUM: 8.8 mg/dL (ref 8.7–10.2)
CHLORIDE: 103 mmol/L (ref 96–106)
CO2: 24 mmol/L (ref 20–29)
Creatinine, Ser: 1.21 mg/dL — ABNORMAL HIGH (ref 0.57–1.00)
GFR calc Af Amer: 64 mL/min/{1.73_m2} (ref >59)
GFR, EST NON AFRICAN AMERICAN: 55 mL/min/{1.73_m2} — AB (ref >59)
GLOBULIN, TOTAL: 3 g/dL (ref 1.5–4.5)
Glucose: 86 mg/dL (ref 65–99)
POTASSIUM: 4 mmol/L (ref 3.5–5.2)
SODIUM: 141 mmol/L (ref 134–144)
Total Protein: 6.8 g/dL (ref 6.0–8.5)

## 2018-06-28 LAB — H. PYLORI ANTIBODY, IGG: H PYLORI IGG: 0.6 {index_val} (ref 0.00–0.79)

## 2018-06-29 ENCOUNTER — Encounter: Payer: Self-pay | Admitting: Internal Medicine

## 2018-06-30 ENCOUNTER — Other Ambulatory Visit: Payer: Self-pay | Admitting: Internal Medicine

## 2018-06-30 DIAGNOSIS — K529 Noninfective gastroenteritis and colitis, unspecified: Secondary | ICD-10-CM

## 2018-07-10 ENCOUNTER — Encounter: Payer: Self-pay | Admitting: Internal Medicine

## 2018-07-10 ENCOUNTER — Ambulatory Visit: Payer: Managed Care, Other (non HMO) | Admitting: Internal Medicine

## 2018-07-10 DIAGNOSIS — I10 Essential (primary) hypertension: Secondary | ICD-10-CM

## 2018-07-10 NOTE — Patient Instructions (Signed)

## 2018-07-10 NOTE — Assessment & Plan Note (Signed)
Improved Continue Losartan and Amlodipine at this time Reinforced DASH diet and exercise for weight loss

## 2018-07-10 NOTE — Progress Notes (Signed)
Subjective:    Patient ID: Margaret Carlson, female    DOB: March 28, 1976, 42 y.o.   MRN: [id number]  HPI  Patient presents to the clinic today to follow-up for hypertension.  At her last visit, Losartan was added to Amlodipine.  She has been taking the medication as prescribed and denies adverse effects.  Her BP today is 138/92, but she reports she only took her medication about 30 minutes ago.  ECG from 02/2015 reviewed.  Review of Systems      Past Medical History:  Diagnosis Date  . Hypertension     Current Outpatient Medications  Medication Sig Dispense Refill  . amLODipine (NORVASC) 10 MG tablet Take 1 tablet (10 mg total) by mouth daily. MUST SCHEDULE ANNUAL EXAM FOR October 90 tablet 0  . Calcium-Vitamin D-Vitamin K (CALCIUM + D + K PO) Take 1 capsule by mouth daily.    . cyanocobalamin 2000 MCG tablet Take 2,000 mcg by mouth daily.    Marland Kitchen HYDROcodone-acetaminophen (NORCO/VICODIN) 5-325 MG tablet Take 1-2 tablets by mouth every 6 (six) hours as needed. 12 tablet 0  . losartan (COZAAR) 25 MG tablet Take 1 tablet (25 mg total) by mouth daily. 30 tablet 2  . Multiple Vitamin (MULTIVITAMIN) tablet Take 1 tablet by mouth daily.    . Norethindrone Acetate-Ethinyl Estradiol (JUNEL 1.5/30) 1.5-30 MG-MCG tablet TAKE 1 TABLET BY MOUTH  EVERY DAY FOR 3 WEEKS THEN  OFF FOR 1 WEEK (Patient taking differently: Take 1 tablet by mouth daily. ) 63 tablet 2  . ondansetron (ZOFRAN) 4 MG tablet Take 1 tablet (4 mg total) by mouth every 8 (eight) hours as needed. 20 tablet 0  . PREVIFEM 0.25-35 MG-MCG tablet Take 1 tablet daily by mouth. 1 Package 1  . rosuvastatin (CRESTOR) 10 MG tablet TAKE 1 TABLET BY MOUTH  DAILY (Patient taking differently: Take 10 mg by mouth daily. ) 90 tablet 0  . Vitamin D, Ergocalciferol, (DRISDOL) 50000 units CAPS capsule Take 1 capsule (50,000 Units total) by mouth every 7 (seven) days. 12 capsule 0   No current facility-administered medications for this visit.      Allergies  Allergen Reactions  . Ranitidine Swelling    Face and lip swelling    Family History  Problem Relation Age of Onset  . Arthritis Maternal Grandmother   . Hypertension Maternal Grandmother   . Breast cancer Maternal Grandmother 77    Social History   Socioeconomic History  . Marital status: Single    Spouse name: Not on file  . Number of children: Not on file  . Years of education: Not on file  . Highest education level: Not on file  Occupational History  . Not on file  Social Needs  . Financial resource strain: Not on file  . Food insecurity:    Worry: Not on file    Inability: Not on file  . Transportation needs:    Medical: Not on file    Non-medical: Not on file  Tobacco Use  . Smoking status: Never Smoker  . Smokeless tobacco: Never Used  Substance and Sexual Activity  . Alcohol use: No    Alcohol/week: 0.0 standard drinks  . Drug use: No  . Sexual activity: Yes  Lifestyle  . Physical activity:    Days per week: Not on file    Minutes per session: Not on file  . Stress: Not on file  Relationships  . Social connections:    Talks on phone: Not on  file    Gets together: Not on file    Attends religious service: Not on file    Active member of club or organization: Not on file    Attends meetings of clubs or organizations: Not on file    Relationship status: Not on file  . Intimate partner violence:    Fear of current or ex partner: Not on file    Emotionally abused: Not on file    Physically abused: Not on file    Forced sexual activity: Not on file  Other Topics Concern  . Not on file  Social History Narrative  . Not on file     Constitutional: Denies fever, malaise, fatigue, headache or abrupt weight changes.  Respiratory: Denies difficulty breathing, shortness of breath, cough or sputum production.   Cardiovascular: Denies chest pain, chest tightness, palpitations or swelling in the hands or feet.  Neurological: Denies dizziness,  difficulty with memory, difficulty with speech or problems with balance and coordination.    No other specific complaints in a complete review of systems (except as listed in HPI above).  Objective:   Physical Exam  BP (!) 138/92   Pulse 72   Temp 98.4 F (36.9 C) (Oral)   Wt 284 lb (128.8 kg)   LMP 06/19/2018   SpO2 98%   BMI 47.26 kg/m  Wt Readings from Last 3 Encounters:  07/10/18 284 lb (128.8 kg)  06/26/18 280 lb (127 kg)  06/18/18 281 lb (127.5 kg)    General: Appears her stated age, obese, in NAD. Cardiovascular: Normal rate and rhythm. S1,S2 noted.  No murmur, rubs or gallops noted.  Pulmonary/Chest: Normal effort and positive vesicular breath sounds. No respiratory distress. No wheezes, rales or ronchi noted.  Neurological: Alert and oriented.   BMET    Component Value Date/Time   NA 141 06/26/2018 1419   NA 137 07/03/2013 2132   K 4.0 06/26/2018 1419   K 3.6 07/03/2013 2132   CL 103 06/26/2018 1419   CL 103 07/03/2013 2132   CO2 24 06/26/2018 1419   CO2 29 07/03/2013 2132   GLUCOSE 86 06/26/2018 1419   GLUCOSE 118 (H) 06/18/2018 1756   GLUCOSE 90 07/03/2013 2132   BUN 16 06/26/2018 1419   BUN 9 07/03/2013 2132   CREATININE 1.21 (H) 06/26/2018 1419   CREATININE 0.85 07/03/2013 2132   CALCIUM 8.8 06/26/2018 1419   CALCIUM 9.0 07/03/2013 2132   GFRNONAA 55 (L) 06/26/2018 1419   GFRNONAA >60 07/03/2013 2132   GFRAA 64 06/26/2018 1419   GFRAA >60 07/03/2013 2132    Lipid Panel     Component Value Date/Time   CHOL 181 06/05/2018 1217   TRIG 53 06/05/2018 1217   HDL 61 06/05/2018 1217   CHOLHDL 3.0 06/05/2018 1217   LDLCALC 109 (H) 06/05/2018 1217    CBC    Component Value Date/Time   WBC 7.7 06/18/2018 1756   RBC 6.05 (H) 06/18/2018 1756   HGB 13.9 06/18/2018 1756   HGB 12.0 06/05/2018 1217   HCT 42.0 06/18/2018 1756   HCT 38.2 06/05/2018 1217   PLT 284 06/18/2018 1756   PLT 309 06/05/2018 1217   MCV 69.4 (L) 06/18/2018 1756   MCV 73 (L)  06/05/2018 1217   MCV 73 (L) 07/03/2013 2132   MCH 23.0 (L) 06/18/2018 1756   MCHC 33.1 06/18/2018 1756   RDW 19.0 (H) 06/18/2018 1756   RDW 19.7 (H) 06/05/2018 1217   RDW 19.1 (H) 07/03/2013 2132  LYMPHSABS 2.5 03/17/2015 1618   EOSABS 0.1 03/17/2015 1618   BASOSABS 0.0 03/17/2015 1618    Hgb A1C Lab Results  Component Value Date   HGBA1C 4.9 06/12/2017            Assessment & Plan:

## 2018-07-11 ENCOUNTER — Telehealth: Payer: Self-pay

## 2018-07-11 NOTE — Telephone Encounter (Signed)
Left message for patient to call back in regards to a referral-Shaden Higley V Clayvon Parlett, RMA

## 2018-07-19 ENCOUNTER — Other Ambulatory Visit: Payer: Self-pay | Admitting: Internal Medicine

## 2018-07-19 DIAGNOSIS — Z01419 Encounter for gynecological examination (general) (routine) without abnormal findings: Secondary | ICD-10-CM

## 2018-08-02 ENCOUNTER — Telehealth: Payer: Self-pay | Admitting: Internal Medicine

## 2018-08-02 NOTE — Telephone Encounter (Signed)
Left message asking pt to call office regarding referral

## 2018-08-20 ENCOUNTER — Other Ambulatory Visit: Payer: Self-pay | Admitting: Internal Medicine

## 2018-10-02 ENCOUNTER — Telehealth: Payer: Self-pay | Admitting: Internal Medicine

## 2018-10-02 DIAGNOSIS — A480 Gas gangrene: Secondary | ICD-10-CM

## 2018-10-02 DIAGNOSIS — R1012 Left upper quadrant pain: Secondary | ICD-10-CM

## 2018-10-02 DIAGNOSIS — R140 Abdominal distension (gaseous): Secondary | ICD-10-CM

## 2018-10-02 NOTE — Telephone Encounter (Signed)
Pt called office stating she was referred to a gastroenterologist back in November. The pt couldn't make the appointment so the referral was cancelled, but now the pt wants to try to get scheduled.

## 2018-10-03 NOTE — Addendum Note (Signed)
Addended by: Jearld Fenton on: 10/03/2018 10:36 AM   Modules accepted: Orders

## 2018-10-03 NOTE — Telephone Encounter (Signed)
Referral to GI placed

## 2018-10-08 ENCOUNTER — Other Ambulatory Visit: Payer: Self-pay | Admitting: Internal Medicine

## 2018-10-08 ENCOUNTER — Encounter: Payer: Self-pay | Admitting: Gastroenterology

## 2018-10-11 ENCOUNTER — Other Ambulatory Visit: Payer: Self-pay | Admitting: Internal Medicine

## 2018-10-12 ENCOUNTER — Encounter: Payer: Self-pay | Admitting: Internal Medicine

## 2018-10-12 MED ORDER — LOSARTAN POTASSIUM 25 MG PO TABS: 25.0000 mg | ORAL_TABLET | Freq: Every day | ORAL | 1 refills | Status: DC

## 2018-10-12 NOTE — Addendum Note (Signed)
Addended by: Lurlean Nanny on: 10/12/2018 10:36 AM   Modules accepted: Orders

## 2018-11-08 ENCOUNTER — Encounter: Payer: Self-pay | Admitting: Gastroenterology

## 2018-11-08 ENCOUNTER — Other Ambulatory Visit: Payer: Self-pay

## 2018-11-08 ENCOUNTER — Ambulatory Visit: Payer: Managed Care, Other (non HMO) | Admitting: Gastroenterology

## 2018-11-08 VITALS — BP 122/84 | HR 72 | Temp 98.1°F | Ht 65.0 in | Wt 284.0 lb

## 2018-11-08 DIAGNOSIS — R142 Eructation: Secondary | ICD-10-CM | POA: Diagnosis not present

## 2018-11-08 DIAGNOSIS — R140 Abdominal distension (gaseous): Secondary | ICD-10-CM

## 2018-11-08 MED ORDER — HYOSCYAMINE SULFATE 0.125 MG SL SUBL: 0.1250 mg | SUBLINGUAL_TABLET | Freq: Four times a day (QID) | SUBLINGUAL | 2 refills | Status: DC | PRN

## 2018-11-08 NOTE — Progress Notes (Signed)
[facility] Consult Note:  History: Margaret Carlson 11/08/2018  Referring physician: Jearld Fenton, NP  Reason for consult/chief complaint: Gas (lots of belching. Can wake up feeling this way. ); Bloated; S/P Gastric Sleeve (in 2014. No GERD prior or now. ); Abdominal Pain; and Emesis (few episodes with abdominal pain. Comes and goes.)   Subjective  HPI:  This is a very pleasant 43 year old woman referred by primary care for frequent belching and bloating.  She has generalized abdominal bloating and gas.  She is most bothered by years of frequent belching, which can sometimes last all day.  She has not noticed any particular clear consistent food triggers.  She does get more bloating and gas from dairy products ever since her gastric sleeve procedure in 2014.  She has had rare episodes of acute onset upper abdominal pain with protracted vomiting.  The first episode occurred before her gastric sleeve, and she has had a few episodes after that, recent occurring several months ago. She stopped chewing gum, has never been a smoker, and does not get regurgitation or dysphagia since her gastric sleeve.   ROS:  Review of Systems  Constitutional: Negative for appetite change and unexpected weight change.  HENT: Negative for mouth sores and voice change.   Eyes: Negative for pain and redness.  Respiratory: Negative for cough and shortness of breath.   Cardiovascular: Negative for chest pain and palpitations.  Genitourinary: Negative for dysuria and hematuria.  Musculoskeletal: Negative for arthralgias and myalgias.  Skin: Negative for pallor and rash.  Neurological: Negative for weakness and headaches.  Hematological: Negative for adenopathy.     Past Medical History: Past Medical History:  Diagnosis Date  . Hypertension      Past Surgical History: Past Surgical History:  Procedure Laterality Date  . LAPAROSCOPIC GASTRIC SLEEVE RESECTION  10/2012      Family History: Family History  Problem Relation Age of Onset  . Arthritis Maternal Grandmother   . Hypertension Maternal Grandmother   . Breast cancer Maternal Grandmother 77    Social History: Social History   Socioeconomic History  . Marital status: Single    Spouse name: Not on file  . Number of children: Not on file  . Years of education: Not on file  . Highest education level: Not on file  Occupational History  . Not on file  Social Needs  . Financial resource strain: Not on file  . Food insecurity:    Worry: Not on file    Inability: Not on file  . Transportation needs:    Medical: Not on file    Non-medical: Not on file  Tobacco Use  . Smoking status: Never Smoker  . Smokeless tobacco: Never Used  Substance and Sexual Activity  . Alcohol use: No    Alcohol/week: 0.0 standard drinks  . Drug use: No  . Sexual activity: Yes    Partners: Male    Birth control/protection: Pill  Lifestyle  . Physical activity:    Days per week: Not on file    Minutes per session: Not on file  . Stress: Not on file  Relationships  . Social connections:    Talks on phone: Not on file    Gets together: Not on file    Attends religious service: Not on file    Active member of club or organization: Not on file    Attends meetings of clubs or organizations: Not on file    Relationship status: Not on  file  Other Topics Concern  . Not on file  Social History Narrative  . Not on file   She is a cytology tech at [employer]: Allergies  Allergen Reactions  . Ranitidine Swelling    Face and lip swelling    Outpatient Meds: Current Outpatient Medications  Medication Sig Dispense Refill  . amLODipine (NORVASC) 10 MG tablet TAKE 1 TABLET BY MOUTH  DAILY. 90 tablet 2  . Calcium-Vitamin D-Vitamin K (CALCIUM + D + K PO) Take 1 capsule by mouth daily.    . cyanocobalamin 2000 MCG tablet Take 2,000 mcg by mouth daily.    Marland Kitchen losartan (COZAAR) 25 MG tablet Take 1 tablet (25 mg  total) by mouth daily. 90 tablet 1  . Multiple Vitamin (MULTIVITAMIN) tablet Take 1 tablet by mouth daily.    . Norethindrone Acetate-Ethinyl Estradiol (JUNEL 1.5/30) 1.5-30 MG-MCG tablet TAKE 1 TABLET BY MOUTH  EVERY DAY FOR 3 WEEKS THEN  OFF FOR 1 WEEK (Patient taking differently: Take 1 tablet by mouth daily. ) 63 tablet 2  . rosuvastatin (CRESTOR) 10 MG tablet Take 1 tablet (10 mg total) by mouth daily. 90 tablet 3  . hyoscyamine (LEVSIN SL) 0.125 MG SL tablet Place 1 tablet (0.125 mg total) under the tongue every 6 (six) hours as needed. For bloating and belching 30 tablet 2   No current facility-administered medications for this visit.       ___________________________________________________________________ Objective   Exam:  BP 122/84   Pulse 72   Temp 98.1 F (36.7 C)   Ht 5\' 5"  (1.651 m)   Wt 284 lb (128.8 kg)   BMI 47.26 kg/m    General: Well-appearing, normal vocal quality  Eyes: sclera anicteric, no redness  ENT: oral mucosa moist without lesions, no cervical or supraclavicular lymphadenopathy  CV: RRR without murmur, S1/S2, no JVD, no peripheral edema  Resp: clear to auscultation bilaterally, normal RR and effort noted  GI: soft, no tenderness, with active bowel sounds. No guarding or palpable organomegaly noted, but it by body habitus  Skin; warm and dry, no rash or jaundice noted  Neuro: awake, alert and oriented x 3. Normal gross motor function and fluent speech  No recent data for review  Assessment: Encounter Diagnoses  Name Primary?  . Belching Yes  . Abdominal bloating     She has frequent belching that is probably an upper GI dysmotility combined with aerophagia.  It may have been exacerbated by gastric surgery.  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 I suspect upper endoscopy would be of low yield in this case, since it does not sound obstructive.  She recalls having had an upper endoscopy after the gastric  sleeve and she was told there was "gastritis".   Plan:  I gave her some written dietary advice for bloating and gas, and a trial of Levsin to take as needed for belching.  If that is not active, low-dose nighttime buspirone might be helpful. She will contact us with follow-up so we can proceed accordingly.  Thank you for the courtesy of this consult.  Please call me with any questions or concerns.  Nelida Meuse III  CC: Referring provider noted above

## 2018-11-08 NOTE — Patient Instructions (Addendum)
If you are age 43 or older, your body mass index should be between 23-30. Your Body mass index is 47.26 kg/m. If this is out of the aforementioned range listed, please consider follow up with your Primary Care Provider.  If you are age 14 or younger, your body mass index should be between 19-25. Your Body mass index is 47.26 kg/m. If this is out of the aformentioned range listed, please consider follow up with your Primary Care Provider.   It was a pleasure to see you today!  Dr. Loletha Carrow   Food Guidelines for gas and bloating  Many people have difficulty digesting certain foods, causing a variety of distressing and embarrassing symptoms such as abdominal pain, bloating and gas.  These foods may need to be avoided or consumed in small amounts.  Here are some tips that might be helpful for you.  1.   Lactose intolerance is the difficulty or complete inability to digest lactose, the natural sugar in milk and anything made from milk.  This condition is harmless, common, and can begin any time during life.  Some people can digest a modest amount of lactose while others cannot tolerate any.  Also, not all dairy products contain equal amounts of lactose.  For example, hard cheeses such as parmesan have less lactose than soft cheeses such as cheddar.  Yogurt has less lactose than milk or cheese.  Many packaged foods (even many brands of bread) have milk, so read ingredient lists carefully.  It is difficult to test for lactose intolerance, so just try avoiding lactose as much as possible for a week and see what happens with your symptoms.  If you seem to be lactose intolerant, the best plan is to avoid it (but make sure you get calcium from another source).  The next best thing is to use lactase enzyme supplements, available over the counter everywhere.  Just know that many lactose intolerant people need to take several tablets with each serving of dairy to avoid symptoms.  Lastly, a lot of restaurant food is  made with milk or butter.  Many are things you might not suspect, such as mashed potatoes, rice and pasta (cooked with butter) and "grilled" items.  If you are lactose intolerant, it never hurts to ask your server what has milk or butter.  2.   Fiber is an important part of your diet, but not all fiber is well-tolerated.  Insoluble fiber such as bran is often consumed by normal gut bacteria and converted into gas.  Soluble fiber such as oats, squash, carrots and green beans are typically tolerated better.  3.   Some types of carbohydrates can be poorly digested.  Examples include: fructose (apples, cherries, pears, raisins and other dried fruits), fructans (onions, zucchini, large amounts of wheat), sorbitol/mannitol/xylitol and sucralose/Splenda (common artificial sweeteners), and raffinose (lentils, broccoli, cabbage, asparagus, brussel sprouts, many types of beans).  Do a Development worker, community for [employer] and you will find helpful information. Beano, a dietary supplement, will often help with raffinose-containing foods.  As with lactase tablets, you may need several per serving.  4.   Whenever possible, avoid processed food&meats and chemical additives.  High fructose corn syrup, a common sweetener, may be difficult to digest.  Eggs and soy (comes from the soybean, and added to many foods now) are other common bloating/gassy foods.  - Dr. Herm[facility]

## 2018-12-02 ENCOUNTER — Encounter: Payer: Self-pay | Admitting: Internal Medicine

## 2018-12-03 ENCOUNTER — Ambulatory Visit (INDEPENDENT_AMBULATORY_CARE_PROVIDER_SITE_OTHER): Payer: Managed Care, Other (non HMO) | Admitting: Internal Medicine

## 2018-12-03 ENCOUNTER — Encounter: Payer: Self-pay | Admitting: Internal Medicine

## 2018-12-03 ENCOUNTER — Other Ambulatory Visit: Payer: Self-pay

## 2018-12-03 DIAGNOSIS — D2362 Other benign neoplasm of skin of left upper limb, including shoulder: Secondary | ICD-10-CM | POA: Diagnosis not present

## 2018-12-03 NOTE — Progress Notes (Signed)
Virtual Visit via Video Note  I connected with Margaret Carlson on 12/03/18 at  3:00 PM EDT by a video enabled telemedicine application and verified that I am speaking with the correct person using two identifiers.   I discussed the limitations of evaluation and management by telemedicine and the availability of in person appointments. The patient expressed understanding and agreed to proceed.  History of Present Illness:  Pt reports skin lesion of left upper arm. She noticed this 1 year ago. It has increased in size. She reports it is intermittently itchy, tender to touch. She has not noticed any drainage to the area. She has not tired anything OTC for this.   Observations/Objective:      Small < 0.5 nodular lesion noted to back of left arm Appears to be a dermatofibroma  Assessment and Plan:  Dermatofibroma:  Advised her this is a benign skin lesion. No intervention needed at this time Will monitor  Follow Up Instructions:    I discussed the assessment and treatment plan with the patient. The patient was provided an opportunity to ask questions and all were answered. The patient agreed with the plan and demonstrated an understanding of the instructions.   The patient was advised to call back or seek an in-person evaluation if the symptoms worsen or if the condition fails to improve as anticipated.   Webb Silversmith, NP

## 2018-12-03 NOTE — Patient Instructions (Signed)
Excision of Skin Lesions, Care After  This sheet gives you information about how to care for yourself after your procedure. Your health care provider may also give you more specific instructions. If you have problems or questions, contact your health care provider.  What can I expect after the procedure?  After your procedure, it is common to have pain or discomfort at the excision site.  Follow these instructions at home:  Excision care       · Follow instructions from your health care provider about how to take care of your excision site. Make sure you:  ? Wash your hands with soap and water before and after you change your bandage (dressing). If soap and water are not available, use hand sanitizer.  ? Change your dressing as told by your health care provider.  ? Leave stitches (sutures), skin glue, or adhesive strips in place. These skin closures may need to stay in place for 2 weeks or longer. If adhesive strip edges start to loosen and curl up, you may trim the loose edges. Do not remove adhesive strips completely unless your health care provider tells you to do that.  · Check the excision area every day for signs of infection. Watch for:  ? Redness, swelling, or pain.  ? Fluid or blood.  ? Warmth.  ? Pus or a bad smell.  · Keep the site clean, dry, and protected for at least 48 hours.  · For bleeding, apply gentle but firm pressure to the area using a folded towel for 20 minutes.  · Avoid high-impact exercise and activities until the sutures are removed or the area heals.  General instructions  · Take over-the-counter and prescription medicines only as told by your health care provider.  · Follow instructions from your health care provider about how to minimize scarring. Scarring should lessen over time.  · Avoid sun exposure until the area has healed. Use sunscreen to protect the area from the sun after it has healed.  · Keep all follow-up visits as told by your health care provider. This is  important.  Contact a health care provider if:  · You have redness, swelling, or pain around your excision site.  · You have fluid or blood coming from your excision site.  · Your excision site feels warm to the touch.  · You have pus or a bad smell coming from your excision site.  · You have a fever.  · You have pain that does not improve in 2-3 days after your procedure.  · You notice skin irregularities or changes in how you feel (sensation).  Summary  · This sheet of instructions provides you with information about caring for yourself after your procedure. Contact your health care provider if you have any problems or questions.  · Take over-the-counter and prescription medicines only as told by your health care provider.  · Change your dressing as told by your health care provider.  · Contact a health care provider if you have redness, swelling, pain, or other signs of infection around your excision site.  · Keep all follow-up visits as told by your health care provider. This is important.  This information is not intended to replace advice given to you by your health care provider. Make sure you discuss any questions you have with your health care provider.  Document Released: 12/30/2014 Document Revised: 02/21/2018 Document Reviewed: 02/21/2018  Elsevier Interactive Patient Education © 2019 Elsevier Inc.   

## 2019-01-30 ENCOUNTER — Encounter: Payer: Self-pay | Admitting: Internal Medicine

## 2019-01-31 MED ORDER — LOSARTAN POTASSIUM 25 MG PO TABS: 25.0000 mg | ORAL_TABLET | Freq: Every day | ORAL | 0 refills | Status: DC

## 2019-03-21 ENCOUNTER — Other Ambulatory Visit: Payer: Self-pay | Admitting: Internal Medicine

## 2019-04-24 ENCOUNTER — Encounter: Payer: Self-pay | Admitting: Internal Medicine

## 2019-04-24 ENCOUNTER — Other Ambulatory Visit: Payer: Self-pay

## 2019-04-24 ENCOUNTER — Ambulatory Visit (INDEPENDENT_AMBULATORY_CARE_PROVIDER_SITE_OTHER): Payer: Managed Care, Other (non HMO) | Admitting: Internal Medicine

## 2019-04-24 VITALS — BP 148/98 | HR 82 | Temp 98.7°F | Wt 286.0 lb

## 2019-04-24 DIAGNOSIS — I10 Essential (primary) hypertension: Secondary | ICD-10-CM | POA: Diagnosis not present

## 2019-04-24 DIAGNOSIS — L989 Disorder of the skin and subcutaneous tissue, unspecified: Secondary | ICD-10-CM | POA: Diagnosis not present

## 2019-04-24 MED ORDER — LOSARTAN POTASSIUM 25 MG PO TABS: 50.0000 mg | ORAL_TABLET | Freq: Every day | ORAL | 0 refills | Status: DC

## 2019-04-24 NOTE — Patient Instructions (Signed)

## 2019-04-24 NOTE — Progress Notes (Signed)
Subjective:    Patient ID: Margaret Carlson, female    DOB: [birth year], 43 y.o.   MRN: [id number]  HPI  Pt presents to the clinic today for HTN. She reports the nurse at work checked her BP yesterday, it was 175/107. She reports she felt sluggish, had some tingling in her lips and trouble with her vision. Upon recheck, it was 162/92. She is taking Losartan and Amlodipine as prescribed. She denies headaches, dizziness, chest pain or SOB. Her BP today is 148/98. ECG from 02/2015 reviewed. She reports increase in stress, poor diet, lack of exercise.  She also reports skin lesion of left arm. She wold like referral to derm for further evaluation.  Review of Systems      Past Medical History:  Diagnosis Date  . Hypertension     Current Outpatient Medications  Medication Sig Dispense Refill  . amLODipine (NORVASC) 10 MG tablet TAKE 1 TABLET BY MOUTH  DAILY. 90 tablet 2  . Calcium-Vitamin D-Vitamin K (CALCIUM + D + K PO) Take 1 capsule by mouth daily.    . cyanocobalamin 2000 MCG tablet Take 2,000 mcg by mouth daily.    . hyoscyamine (LEVSIN SL) 0.125 MG SL tablet Place 1 tablet (0.125 mg total) under the tongue every 6 (six) hours as needed. For bloating and belching 30 tablet 2  . losartan (COZAAR) 25 MG tablet TAKE 1 TABLET BY MOUTH  DAILY 90 tablet 0  . Multiple Vitamin (MULTIVITAMIN) tablet Take 1 tablet by mouth daily.    . Norethindrone Acetate-Ethinyl Estradiol (JUNEL 1.5/30) 1.5-30 MG-MCG tablet TAKE 1 TABLET BY MOUTH  EVERY DAY FOR 3 WEEKS THEN  OFF FOR 1 WEEK (Patient taking differently: Take 1 tablet by mouth daily. ) 63 tablet 2  . rosuvastatin (CRESTOR) 10 MG tablet Take 1 tablet (10 mg total) by mouth daily. 90 tablet 3   No current facility-administered medications for this visit.     Allergies  Allergen Reactions  . Ranitidine Swelling    Face and lip swelling    Family History  Problem Relation Age of Onset  . Arthritis Maternal Grandmother   . Hypertension  Maternal Grandmother   . Breast cancer Maternal Grandmother 77    Social History   Socioeconomic History  . Marital status: Single    Spouse name: Not on file  . Number of children: Not on file  . Years of education: Not on file  . Highest education level: Not on file  Occupational History  . Not on file  Social Needs  . Financial resource strain: Not on file  . Food insecurity    Worry: Not on file    Inability: Not on file  . Transportation needs    Medical: Not on file    Non-medical: Not on file  Tobacco Use  . Smoking status: Never Smoker  . Smokeless tobacco: Never Used  Substance and Sexual Activity  . Alcohol use: No    Alcohol/week: 0.0 standard drinks  . Drug use: No  . Sexual activity: Yes    Partners: Male    Birth control/protection: Pill  Lifestyle  . Physical activity    Days per week: Not on file    Minutes per session: Not on file  . Stress: Not on file  Relationships  . Social Herbalist on phone: Not on file    Gets together: Not on file    Attends religious service: Not on file    Active  member of club or organization: Not on file    Attends meetings of clubs or organizations: Not on file    Relationship status: Not on file  . Intimate partner violence    Fear of current or ex partner: Not on file    Emotionally abused: Not on file    Physically abused: Not on file    Forced sexual activity: Not on file  Other Topics Concern  . Not on file  Social History Narrative  . Not on file     Constitutional: Denies fever, malaise, fatigue, headache or abrupt weight changes.  Respiratory: Denies difficulty breathing, shortness of breath, cough or sputum production.   Cardiovascular: Denies chest pain, chest tightness, palpitations or swelling in the hands or feet.  Skin: Pt reports skin lesion of left arm. Denies redness, rashes, or ulcercations.  Neurological: Denies dizziness, difficulty with memory, difficulty with speech or problems  with balance and coordination.  Psych: Pt reports stress. Denies anxiety, depression, SI/HI.  No other specific complaints in a complete review of systems (except as listed in HPI above).  Objective:   Physical Exam   BP (!) 148/98   Pulse 82   Temp 98.7 F (37.1 C) (Temporal)   Wt 286 lb (129.7 kg)   SpO2 98%   BMI 47.59 kg/m  Wt Readings from Last 3 Encounters:  04/24/19 286 lb (129.7 kg)  11/08/18 284 lb (128.8 kg)  07/10/18 284 lb (128.8 kg)    General: Appears her stated age, obese, in NAD. Skin: Warm, dry and intact. 0.75 cm round, raised hyperpigmented lesion of left upper arm. Cardiovascular: Normal rate and rhythm. S1,S2 noted.  No murmur, rubs or gallops noted. No JVD or BLE edema. Pulmonary/Chest: Normal effort and positive vesicular breath sounds. No respiratory distress. No wheezes, rales or ronchi noted.  Neurological: Alert and oriented.  Coordination normal.  Psychiatric: Mood and affect normal. Behavior is normal. Judgment and thought content normal.    BMET    Component Value Date/Time   NA 141 06/26/2018 1419   NA 137 07/03/2013 2132   K 4.0 06/26/2018 1419   K 3.6 07/03/2013 2132   CL 103 06/26/2018 1419   CL 103 07/03/2013 2132   CO2 24 06/26/2018 1419   CO2 29 07/03/2013 2132   GLUCOSE 86 06/26/2018 1419   GLUCOSE 118 (H) 06/18/2018 1756   GLUCOSE 90 07/03/2013 2132   BUN 16 06/26/2018 1419   BUN 9 07/03/2013 2132   CREATININE 1.21 (H) 06/26/2018 1419   CREATININE 0.85 07/03/2013 2132   CALCIUM 8.8 06/26/2018 1419   CALCIUM 9.0 07/03/2013 2132   GFRNONAA 55 (L) 06/26/2018 1419   GFRNONAA >60 07/03/2013 2132   GFRAA 64 06/26/2018 1419   GFRAA >60 07/03/2013 2132    Lipid Panel     Component Value Date/Time   CHOL 181 06/05/2018 1217   TRIG 53 06/05/2018 1217   HDL 61 06/05/2018 1217   CHOLHDL 3.0 06/05/2018 1217   LDLCALC 109 (H) 06/05/2018 1217    CBC    Component Value Date/Time   WBC 7.7 06/18/2018 1756   RBC 6.05 (H)  06/18/2018 1756   HGB 13.9 06/18/2018 1756   HGB 12.0 06/05/2018 1217   HCT 42.0 06/18/2018 1756   HCT 38.2 06/05/2018 1217   PLT 284 06/18/2018 1756   PLT 309 06/05/2018 1217   MCV 69.4 (L) 06/18/2018 1756   MCV 73 (L) 06/05/2018 1217   MCV 73 (L) 07/03/2013 2132  MCH 23.0 (L) 06/18/2018 1756   MCHC 33.1 06/18/2018 1756   RDW 19.0 (H) 06/18/2018 1756   RDW 19.7 (H) 06/05/2018 1217   RDW 19.1 (H) 07/03/2013 2132   LYMPHSABS 2.5 03/17/2015 1618   EOSABS 0.1 03/17/2015 1618   BASOSABS 0.0 03/17/2015 1618    Hgb A1C Lab Results  Component Value Date   HGBA1C 4.9 06/12/2017           Assessment & Plan:   HTN:   Uncontrolled  Increase Losartan to 50 mg daily Continue Amlodipine 10 mg Reinforced DASH diet and exercise for weight loss  Skin Lesion of Arm:  Referral to dermatology placed  RTC in 2 weeks for follow up HTN Webb Silversmith, NP

## 2019-05-09 ENCOUNTER — Ambulatory Visit: Payer: Managed Care, Other (non HMO) | Admitting: Internal Medicine

## 2019-05-09 ENCOUNTER — Other Ambulatory Visit: Payer: Self-pay

## 2019-05-09 VITALS — BP 128/86 | HR 78 | Temp 97.9°F | Wt 290.0 lb

## 2019-05-09 DIAGNOSIS — I10 Essential (primary) hypertension: Secondary | ICD-10-CM | POA: Diagnosis not present

## 2019-05-09 DIAGNOSIS — Z23 Encounter for immunization: Secondary | ICD-10-CM | POA: Diagnosis not present

## 2019-05-09 NOTE — Progress Notes (Signed)
Subjective:    Patient ID: Margaret Carlson, female    DOB: 1976-05-29, 43 y.o.   MRN: [id number]  HPI  Pt presents to the clinic today for 2 week follow up of HTN. At her last visit, her Losartan was increased to 50 mg daily. She continue her Amlodipine. She denies adverse side effects. Her BP today is 128/86.  Review of Systems  Past Medical History:  Diagnosis Date  . Hypertension     Current Outpatient Medications  Medication Sig Dispense Refill  . amLODipine (NORVASC) 10 MG tablet TAKE 1 TABLET BY MOUTH  DAILY. 90 tablet 2  . Calcium-Vitamin D-Vitamin K (CALCIUM + D + K PO) Take 1 capsule by mouth daily.    . cyanocobalamin 2000 MCG tablet Take 2,000 mcg by mouth daily.    . hyoscyamine (LEVSIN SL) 0.125 MG SL tablet Place 1 tablet (0.125 mg total) under the tongue every 6 (six) hours as needed. For bloating and belching 30 tablet 2  . losartan (COZAAR) 25 MG tablet Take 2 tablets (50 mg total) by mouth daily. 90 tablet 0  . Multiple Vitamin (MULTIVITAMIN) tablet Take 1 tablet by mouth daily.    . Norethindrone Acetate-Ethinyl Estradiol (JUNEL 1.5/30) 1.5-30 MG-MCG tablet TAKE 1 TABLET BY MOUTH  EVERY DAY FOR 3 WEEKS THEN  OFF FOR 1 WEEK (Patient taking differently: Take 1 tablet by mouth daily. ) 63 tablet 2  . rosuvastatin (CRESTOR) 10 MG tablet Take 1 tablet (10 mg total) by mouth daily. 90 tablet 3   No current facility-administered medications for this visit.     Allergies  Allergen Reactions  . Ranitidine Swelling    Face and lip swelling    Family History  Problem Relation Age of Onset  . Arthritis Maternal Grandmother   . Hypertension Maternal Grandmother   . Breast cancer Maternal Grandmother 77    Social History   Socioeconomic History  . Marital status: Single    Spouse name: Not on file  . Number of children: Not on file  . Years of education: Not on file  . Highest education level: Not on file  Occupational History  . Not on file  Social Needs   . Financial resource strain: Not on file  . Food insecurity    Worry: Not on file    Inability: Not on file  . Transportation needs    Medical: Not on file    Non-medical: Not on file  Tobacco Use  . Smoking status: Never Smoker  . Smokeless tobacco: Never Used  Substance and Sexual Activity  . Alcohol use: No    Alcohol/week: 0.0 standard drinks  . Drug use: No  . Sexual activity: Yes    Partners: Male    Birth control/protection: Pill  Lifestyle  . Physical activity    Days per week: Not on file    Minutes per session: Not on file  . Stress: Not on file  Relationships  . Social Herbalist on phone: Not on file    Gets together: Not on file    Attends religious service: Not on file    Active member of club or organization: Not on file    Attends meetings of clubs or organizations: Not on file    Relationship status: Not on file  . Intimate partner violence    Fear of current or ex partner: Not on file    Emotionally abused: Not on file    Physically abused:  Not on file    Forced sexual activity: Not on file  Other Topics Concern  . Not on file  Social History Narrative  . Not on file     Constitutional: Denies fever, malaise, fatigue, headache or abrupt weight changes.  Respiratory: Denies difficulty breathing, shortness of breath, cough or sputum production.   Cardiovascular: Denies chest pain, chest tightness, palpitations or swelling in the hands or feet.  Neurological: Denies dizziness, difficulty with memory, difficulty with speech or problems with balance and coordination.    No other specific complaints in a complete review of systems (except as listed in HPI above).     Objective:   Physical Exam  BP 128/86   Pulse 78   Temp 97.9 F (36.6 C) (Temporal)   Wt 290 lb (131.5 kg)   SpO2 98%   BMI 48.26 kg/m  Wt Readings from Last 3 Encounters:  05/09/19 290 lb (131.5 kg)  04/24/19 286 lb (129.7 kg)  11/08/18 284 lb (128.8 kg)     General: Appears her stated age, obese, in NAD. Cardiovascular: Normal rate and rhythm. S1,S2 noted.  No murmur, rubs or gallops noted.  Pulmonary/Chest: Normal effort and positive vesicular breath sounds. No respiratory distress. No wheezes, rales or ronchi noted.  Neurological: Alert and oriented.   BMET    Component Value Date/Time   NA 141 06/26/2018 1419   NA 137 07/03/2013 2132   K 4.0 06/26/2018 1419   K 3.6 07/03/2013 2132   CL 103 06/26/2018 1419   CL 103 07/03/2013 2132   CO2 24 06/26/2018 1419   CO2 29 07/03/2013 2132   GLUCOSE 86 06/26/2018 1419   GLUCOSE 118 (H) 06/18/2018 1756   GLUCOSE 90 07/03/2013 2132   BUN 16 06/26/2018 1419   BUN 9 07/03/2013 2132   CREATININE 1.21 (H) 06/26/2018 1419   CREATININE 0.85 07/03/2013 2132   CALCIUM 8.8 06/26/2018 1419   CALCIUM 9.0 07/03/2013 2132   GFRNONAA 55 (L) 06/26/2018 1419   GFRNONAA >60 07/03/2013 2132   GFRAA 64 06/26/2018 1419   GFRAA >60 07/03/2013 2132    Lipid Panel     Component Value Date/Time   CHOL 181 06/05/2018 1217   TRIG 53 06/05/2018 1217   HDL 61 06/05/2018 1217   CHOLHDL 3.0 06/05/2018 1217   LDLCALC 109 (H) 06/05/2018 1217    CBC    Component Value Date/Time   WBC 7.7 06/18/2018 1756   RBC 6.05 (H) 06/18/2018 1756   HGB 13.9 06/18/2018 1756   HGB 12.0 06/05/2018 1217   HCT 42.0 06/18/2018 1756   HCT 38.2 06/05/2018 1217   PLT 284 06/18/2018 1756   PLT 309 06/05/2018 1217   MCV 69.4 (L) 06/18/2018 1756   MCV 73 (L) 06/05/2018 1217   MCV 73 (L) 07/03/2013 2132   MCH 23.0 (L) 06/18/2018 1756   MCHC 33.1 06/18/2018 1756   RDW 19.0 (H) 06/18/2018 1756   RDW 19.7 (H) 06/05/2018 1217   RDW 19.1 (H) 07/03/2013 2132   LYMPHSABS 2.5 03/17/2015 1618   EOSABS 0.1 03/17/2015 1618   BASOSABS 0.0 03/17/2015 1618    Hgb A1C Lab Results  Component Value Date   HGBA1C 4.9 06/12/2017            Assessment & Plan:

## 2019-05-14 ENCOUNTER — Other Ambulatory Visit: Payer: Self-pay | Admitting: Internal Medicine

## 2019-05-14 DIAGNOSIS — Z1231 Encounter for screening mammogram for malignant neoplasm of breast: Secondary | ICD-10-CM

## 2019-05-15 ENCOUNTER — Encounter: Payer: Self-pay | Admitting: Internal Medicine

## 2019-05-15 NOTE — Patient Instructions (Signed)

## 2019-05-15 NOTE — Assessment & Plan Note (Signed)
Controlled on Losartan and Amlodipine Reinforced DASH diet and exercise for weight loss Will monitor

## 2019-05-23 ENCOUNTER — Other Ambulatory Visit: Payer: Self-pay

## 2019-05-23 ENCOUNTER — Ambulatory Visit
Admission: RE | Admit: 2019-05-23 | Discharge: 2019-05-23 | Disposition: A | Discharge status: Home / Self Care | Payer: Managed Care, Other (non HMO) | Source: Ambulatory Visit | Attending: Internal Medicine | Admitting: Internal Medicine

## 2019-05-23 ENCOUNTER — Encounter: Payer: Self-pay | Admitting: Internal Medicine

## 2019-05-23 ENCOUNTER — Ambulatory Visit (INDEPENDENT_AMBULATORY_CARE_PROVIDER_SITE_OTHER): Payer: Managed Care, Other (non HMO) | Admitting: Internal Medicine

## 2019-05-23 ENCOUNTER — Other Ambulatory Visit: Payer: Self-pay | Admitting: Internal Medicine

## 2019-05-23 VITALS — BP 132/84 | HR 70 | Temp 98.3°F | Wt 293.0 lb

## 2019-05-23 DIAGNOSIS — M7989 Other specified soft tissue disorders: Secondary | ICD-10-CM

## 2019-05-23 DIAGNOSIS — M79601 Pain in right arm: Secondary | ICD-10-CM

## 2019-05-23 MED ORDER — PREDNISONE 10 MG PO TABS: ORAL_TABLET | ORAL | 0 refills | Status: DC

## 2019-05-23 NOTE — Patient Instructions (Signed)
Edema  Edema is when you have too much fluid in your body or under your skin. Edema may make your legs, feet, and ankles swell up. Swelling is also common in looser tissues, like around your eyes. This is a common condition. It gets more common as you get older. There are many possible causes of edema. Eating too much salt (sodium) and being on your feet or sitting for a [place]cki time can cause edema in your legs, feet, and ankles. Hot weather may make edema worse. Edema is usually painless. Your skin may look swollen or shiny. Follow these instructions at home:  Keep the swollen body part raised (elevated) above the level of your heart when you are sitting or lying down.  Do not sit still or stand for a Bussie time.  Do not wear tight clothes. Do not wear garters on your upper legs.  Exercise your legs. This can help the swelling go down.  Wear elastic bandages or support stockings as told by your doctor.  Eat a low-salt (low-sodium) diet to reduce fluid as told by your doctor.  Depending on the cause of your swelling, you may need to limit how much fluid you drink (fluid restriction).  Take over-the-counter and prescription medicines only as told by your doctor. Contact a doctor if:  Treatment is not working.  You have heart, liver, or kidney disease and have symptoms of edema.  You have sudden and unexplained weight gain. Get help right away if:  You have shortness of breath or chest pain.  You cannot breathe when you lie down.  You have pain, redness, or warmth in the swollen areas.  You have heart, liver, or kidney disease and get edema all of a sudden.  You have a fever and your symptoms get worse all of a sudden. Summary  Edema is when you have too much fluid in your body or under your skin.  Edema may make your legs, feet, and ankles swell up. Swelling is also common in looser tissues, like around your eyes.  Raise (elevate) the swollen body part above the level of your  heart when you are sitting or lying down.  Follow your doctor's instructions about diet and how much fluid you can drink (fluid restriction). This information is not intended to replace advice given to you by your health care provider. Make sure you discuss any questions you have with your health care provider. Document Released: 02/01/2008 Document Revised: 08/18/2017 Document Reviewed: 09/02/2016 Elsevier Patient Education  2020 [employer] Inc.  

## 2019-05-23 NOTE — Progress Notes (Signed)
Subjective:    Patient ID: Margaret Carlson, female    DOB: 1976/06/02, 43 y.o.   MRN: [id number]  HPI  Pt presents to the clinic today with right upper arm pain, swelling and warmth. She reports this started 1 month ago, but worsened in the last week. She initially saw some redness and felt tenderness of the right upper arm. The redness resolved, but the pain moved down into her lower arm. She denies numbness, tingling or weakness. She started noticing the swelling 1 week ago. She denies any injury to the area. She has taken Benadryl with minimal relief.  Review of Systems      Past Medical History:  Diagnosis Date  . Hypertension     Current Outpatient Medications  Medication Sig Dispense Refill  . amLODipine (NORVASC) 10 MG tablet TAKE 1 TABLET BY MOUTH  DAILY. 90 tablet 2  . Calcium-Vitamin D-Vitamin K (CALCIUM + D + K PO) Take 1 capsule by mouth daily.    . cyanocobalamin 2000 MCG tablet Take 2,000 mcg by mouth daily.    . hyoscyamine (LEVSIN SL) 0.125 MG SL tablet Place 1 tablet (0.125 mg total) under the tongue every 6 (six) hours as needed. For bloating and belching 30 tablet 2  . losartan (COZAAR) 25 MG tablet Take 2 tablets (50 mg total) by mouth daily. 90 tablet 0  . Multiple Vitamin (MULTIVITAMIN) tablet Take 1 tablet by mouth daily.    . Norethindrone Acetate-Ethinyl Estradiol (JUNEL 1.5/30) 1.5-30 MG-MCG tablet TAKE 1 TABLET BY MOUTH  EVERY DAY FOR 3 WEEKS THEN  OFF FOR 1 WEEK (Patient taking differently: Take 1 tablet by mouth daily. ) 63 tablet 2  . rosuvastatin (CRESTOR) 10 MG tablet Take 1 tablet (10 mg total) by mouth daily. 90 tablet 3   No current facility-administered medications for this visit.     Allergies  Allergen Reactions  . Ranitidine Swelling    Face and lip swelling    Family History  Problem Relation Age of Onset  . Arthritis Maternal Grandmother   . Hypertension Maternal Grandmother   . Breast cancer Maternal Grandmother 77    Social  History   Socioeconomic History  . Marital status: Single    Spouse name: Not on file  . Number of children: Not on file  . Years of education: Not on file  . Highest education level: Not on file  Occupational History  . Not on file  Social Needs  . Financial resource strain: Not on file  . Food insecurity    Worry: Not on file    Inability: Not on file  . Transportation needs    Medical: Not on file    Non-medical: Not on file  Tobacco Use  . Smoking status: Never Smoker  . Smokeless tobacco: Never Used  Substance and Sexual Activity  . Alcohol use: No    Alcohol/week: 0.0 standard drinks  . Drug use: No  . Sexual activity: Yes    Partners: Male    Birth control/protection: Pill  Lifestyle  . Physical activity    Days per week: Not on file    Minutes per session: Not on file  . Stress: Not on file  Relationships  . Social Herbalist on phone: Not on file    Gets together: Not on file    Attends religious service: Not on file    Active member of club or organization: Not on file    Attends meetings  of clubs or organizations: Not on file    Relationship status: Not on file  . Intimate partner violence    Fear of current or ex partner: Not on file    Emotionally abused: Not on file    Physically abused: Not on file    Forced sexual activity: Not on file  Other Topics Concern  . Not on file  Social History Narrative  . Not on file     Constitutional: Denies fever, malaise, fatigue, headache or abrupt weight changes.  Respiratory: Denies difficulty breathing, shortness of breath, cough or sputum production.   Cardiovascular: Denies chest pain, chest tightness, palpitations or swelling in the hands or feet.  Musculoskeletal: Pt reports right arm pain and swelling. Denies decrease in range of motion, difficulty with gait, muscle pain.  Skin: Pt reports redness of right upper extremity (rersolved). Denies rashes, lesions or ulcercations.  Neurological:  Denies numbness, tingling, weakness or problems with balance and coordination.    No other specific complaints in a complete review of systems (except as listed in HPI above).  Objective:   Physical Exam   BP 132/84   Pulse 70   Temp 98.3 F (36.8 C) (Temporal)   Wt 293 lb (132.9 kg)   SpO2 98%   BMI 48.76 kg/m  Wt Readings from Last 3 Encounters:  05/23/19 293 lb (132.9 kg)  05/09/19 290 lb (131.5 kg)  04/24/19 286 lb (129.7 kg)    General: Appears her stated age, obese, in NAD. Skin: Warm, dry and intact. No rashes, lesions or redness noted. Cardiovascular: Radial pulses 2+ bilaterally. Pulmonary/Chest: Normal effort and positive vesicular breath sounds. No respiratory distress. No wheezes, rales or ronchi noted.  Musculoskeletal: Normal internal and external rotation of the right shoulder. Normal flexion, extension and rotation of the right elbow. 1+ right arm swelling with warmth noted. Strength 5/5 BUE. Neurological: Alert and oriented. Sensation intact to BUE.   BMET    Component Value Date/Time   NA 141 06/26/2018 1419   NA 137 07/03/2013 2132   K 4.0 06/26/2018 1419   K 3.6 07/03/2013 2132   CL 103 06/26/2018 1419   CL 103 07/03/2013 2132   CO2 24 06/26/2018 1419   CO2 29 07/03/2013 2132   GLUCOSE 86 06/26/2018 1419   GLUCOSE 118 (H) 06/18/2018 1756   GLUCOSE 90 07/03/2013 2132   BUN 16 06/26/2018 1419   BUN 9 07/03/2013 2132   CREATININE 1.21 (H) 06/26/2018 1419   CREATININE 0.85 07/03/2013 2132   CALCIUM 8.8 06/26/2018 1419   CALCIUM 9.0 07/03/2013 2132   GFRNONAA 55 (L) 06/26/2018 1419   GFRNONAA >60 07/03/2013 2132   GFRAA 64 06/26/2018 1419   GFRAA >60 07/03/2013 2132    Lipid Panel     Component Value Date/Time   CHOL 181 06/05/2018 1217   TRIG 53 06/05/2018 1217   HDL 61 06/05/2018 1217   CHOLHDL 3.0 06/05/2018 1217   LDLCALC 109 (H) 06/05/2018 1217    CBC    Component Value Date/Time   WBC 7.7 06/18/2018 1756   RBC 6.05 (H)  06/18/2018 1756   HGB 13.9 06/18/2018 1756   HGB 12.0 06/05/2018 1217   HCT 42.0 06/18/2018 1756   HCT 38.2 06/05/2018 1217   PLT 284 06/18/2018 1756   PLT 309 06/05/2018 1217   MCV 69.4 (L) 06/18/2018 1756   MCV 73 (L) 06/05/2018 1217   MCV 73 (L) 07/03/2013 2132   MCH 23.0 (L) 06/18/2018 1756  MCHC 33.1 06/18/2018 1756   RDW 19.0 (H) 06/18/2018 1756   RDW 19.7 (H) 06/05/2018 1217   RDW 19.1 (H) 07/03/2013 2132   LYMPHSABS 2.5 03/17/2015 1618   EOSABS 0.1 03/17/2015 1618   BASOSABS 0.0 03/17/2015 1618    Hgb A1C Lab Results  Component Value Date   HGBA1C 4.9 06/12/2017           Assessment & Plan:   Right Arm Swelling, Pain and Warmth:  Concern for blood clot to right arm CBC and D dimer today Will obatin US RUE to r/o DVT If workup normal, consider Pred Taper for pain and swelling- could be a tendonitis  Return precautions discussed Webb Silversmith, NP

## 2019-05-24 ENCOUNTER — Other Ambulatory Visit: Payer: Self-pay | Admitting: Internal Medicine

## 2019-05-24 LAB — CBC
Hematocrit: 36.1 % (ref 34.0–46.6)
Hemoglobin: 12 g/dL (ref 11.1–15.9)
MCH: 24 pg — ABNORMAL LOW (ref 26.6–33.0)
MCHC: 33.2 g/dL (ref 31.5–35.7)
MCV: 72 fL — ABNORMAL LOW (ref 79–97)
Platelets: 292 10*3/uL (ref 150–450)
RBC: 5 x10E6/uL (ref 3.77–5.28)
RDW: 18.2 % — ABNORMAL HIGH (ref 11.7–15.4)
WBC: 4.5 10*3/uL (ref 3.4–10.8)

## 2019-05-24 LAB — D-DIMER, QUANTITATIVE: D-DIMER: 0.37 mg/L FEU (ref 0.00–0.49)

## 2019-05-28 ENCOUNTER — Encounter: Payer: Self-pay | Admitting: Internal Medicine

## 2019-05-28 ENCOUNTER — Other Ambulatory Visit: Payer: Self-pay | Admitting: Internal Medicine

## 2019-05-28 MED ORDER — NORETHINDRONE ACET-ETHINYL EST 1.5-30 MG-MCG PO TABS: ORAL_TABLET | ORAL | 0 refills | Status: DC

## 2019-05-29 ENCOUNTER — Encounter: Payer: Self-pay | Admitting: Internal Medicine

## 2019-06-10 ENCOUNTER — Encounter: Payer: Self-pay | Admitting: Podiatry

## 2019-06-10 ENCOUNTER — Other Ambulatory Visit: Payer: Self-pay

## 2019-06-10 ENCOUNTER — Ambulatory Visit: Payer: Managed Care, Other (non HMO) | Admitting: Podiatry

## 2019-06-10 DIAGNOSIS — L603 Nail dystrophy: Secondary | ICD-10-CM | POA: Diagnosis not present

## 2019-06-10 MED ORDER — CIPROFLOXACIN-DEXAMETHASONE 0.3-0.1 % OT SUSP: OTIC | 2 refills | Status: DC

## 2019-06-10 NOTE — Progress Notes (Signed)
She presents today for tender discolored toenail hallux right.  States that been present like this for about a week says she noticed that after coming home from going to the beach where she soaked her toes in the ocean.  She states that after the toenail polish came off because the toenail was tender if the toenail was green in color cleared.  She states that I clipped it to make it feel better so would not rub in my socks and shoes and I tried soaking in peroxide and vinegar.  ROS: Denies fever chills nausea vomiting muscle aches pains calf pain back pain chest pain shortness of breath.  Objective: Vital signs are stable she is alert and oriented x3 I reviewed her past medical history medications and allergies.  Pulses are strongly palpable.  Neurologic sensorium is intact deep tendon reflexes are intact bilateral.  Muscle strength is normal symmetrical bilateral.  Orthopedic evaluation demonstrates all joints distal to the ankle full range of motion without crepitation.  Cutaneous evaluation of straits supple well-hydrated cutis no signs of tinea pedis but she does have a discolored hallux nail right.  There is a green tint to it with some subungual debris present.  Assessment: Bacterial infection hallux nail right.  Plan: At this point samples of the nail were taken today to be sent for pathologic evaluation but I did get her started on Cipro drops to be applied to the nail twice daily.  Follow-up with her in a month

## 2019-06-26 ENCOUNTER — Telehealth: Payer: Self-pay

## 2019-06-26 NOTE — Telephone Encounter (Signed)
-----   Message from Garrel Ridgel, [place] sent at 06/25/2019  7:25 AM EDT ----- Neg for fungus.  Positive for Bacteria. So no need for office visit.  Call in corticosporin otic and have her put it on her nail edge twice daily for 4 months then follow up with [place].

## 2019-06-26 NOTE — Telephone Encounter (Signed)
Patient has been contacted and informed of results and instructed to apply cortisporin drops to nail twice daily x 4 months.  We will cancel upcoming appt in November and she will call back in about 3 months to schedule follow up for 4 months per Dr. Milinda Pointer.

## 2019-06-27 ENCOUNTER — Ambulatory Visit
Admission: RE | Admit: 2019-06-27 | Discharge: 2019-06-27 | Disposition: A | Discharge status: Home / Self Care | Payer: Managed Care, Other (non HMO) | Source: Ambulatory Visit | Attending: Internal Medicine | Admitting: Internal Medicine

## 2019-06-27 DIAGNOSIS — Z1231 Encounter for screening mammogram for malignant neoplasm of breast: Secondary | ICD-10-CM | POA: Diagnosis present

## 2019-07-04 ENCOUNTER — Ambulatory Visit (INDEPENDENT_AMBULATORY_CARE_PROVIDER_SITE_OTHER): Payer: Managed Care, Other (non HMO) | Admitting: Internal Medicine

## 2019-07-04 ENCOUNTER — Encounter: Payer: Self-pay | Admitting: Internal Medicine

## 2019-07-04 ENCOUNTER — Other Ambulatory Visit: Payer: Self-pay

## 2019-07-04 VITALS — BP 128/86 | HR 62 | Temp 98.1°F | Ht 65.0 in | Wt 285.0 lb

## 2019-07-04 DIAGNOSIS — E782 Mixed hyperlipidemia: Secondary | ICD-10-CM | POA: Diagnosis not present

## 2019-07-04 DIAGNOSIS — Z124 Encounter for screening for malignant neoplasm of cervix: Secondary | ICD-10-CM

## 2019-07-04 DIAGNOSIS — Z0000 Encounter for general adult medical examination without abnormal findings: Secondary | ICD-10-CM | POA: Diagnosis not present

## 2019-07-04 DIAGNOSIS — I10 Essential (primary) hypertension: Secondary | ICD-10-CM | POA: Diagnosis not present

## 2019-07-04 DIAGNOSIS — Z903 Acquired absence of stomach [part of]: Secondary | ICD-10-CM

## 2019-07-04 DIAGNOSIS — Z113 Encounter for screening for infections with a predominantly sexual mode of transmission: Secondary | ICD-10-CM | POA: Diagnosis not present

## 2019-07-04 NOTE — Patient Instructions (Signed)
Health Maintenance, Female Adopting a healthy lifestyle and getting preventive care are important in promoting health and wellness. Ask your health care provider about:  The right schedule for you to have regular tests and exams.  Things you can do on your own to prevent diseases and keep yourself healthy. What should I know about diet, weight, and exercise? Eat a healthy diet   Eat a diet that includes plenty of vegetables, fruits, low-fat dairy products, and lean protein.  Do not eat a lot of foods that are high in solid fats, added sugars, or sodium. Maintain a healthy weight Body mass index (BMI) is used to identify weight problems. It estimates body fat based on height and weight. Your health care provider can help determine your BMI and help you achieve or maintain a healthy weight. Get regular exercise Get regular exercise. This is one of the most important things you can do for your health. Most adults should:  Exercise for at least 150 minutes each week. The exercise should increase your heart rate and make you sweat (moderate-intensity exercise).  Do strengthening exercises at least twice a week. This is in addition to the moderate-intensity exercise.  Spend less time sitting. Even light physical activity can be beneficial. Watch cholesterol and blood lipids Have your blood tested for lipids and cholesterol at 43 years of age, then have this test every 5 years. Have your cholesterol levels checked more often if:  Your lipid or cholesterol levels are high.  You are older than 43 years of age.  You are at high risk for heart disease. What should I know about cancer screening? Depending on your health history and family history, you may need to have cancer screening at various ages. This may include screening for:  Breast cancer.  Cervical cancer.  Colorectal cancer.  Skin cancer.  Lung cancer. What should I know about heart disease, diabetes, and high blood  pressure? Blood pressure and heart disease  High blood pressure causes heart disease and increases the risk of stroke. This is more likely to develop in people who have high blood pressure readings, are of African descent, or are overweight.  Have your blood pressure checked: ? Every 3-5 years if you are 18-39 years of age. ? Every year if you are 40 years old or older. Diabetes Have regular diabetes screenings. This checks your fasting blood sugar level. Have the screening done:  Once every three years after age 40 if you are at a normal weight and have a low risk for diabetes.  More often and at a younger age if you are overweight or have a high risk for diabetes. What should I know about preventing infection? Hepatitis B If you have a higher risk for hepatitis B, you should be screened for this virus. Talk with your health care provider to find out if you are at risk for hepatitis B infection. Hepatitis C Testing is recommended for:  Everyone born from 1945 through 1965.  Anyone with known risk factors for hepatitis C. Sexually transmitted infections (STIs)  Get screened for STIs, including gonorrhea and chlamydia, if: ? You are sexually active and are younger than 43 years of age. ? You are older than 43 years of age and your health care provider tells you that you are at risk for this type of infection. ? Your sexual activity has changed since you were last screened, and you are at increased risk for chlamydia or gonorrhea. Ask your health care provider if   you are at risk.  Ask your health care provider about whether you are at high risk for HIV. Your health care provider may recommend a prescription medicine to help prevent HIV infection. If you choose to take medicine to prevent HIV, you should first get tested for HIV. You should then be tested every 3 months for as Railey as you are taking the medicine. Pregnancy  If you are about to stop having your period (premenopausal) and  you may become pregnant, seek counseling before you get pregnant.  Take 400 to 800 micrograms (mcg) of folic acid every day if you become pregnant.  Ask for birth control (contraception) if you want to prevent pregnancy. Osteoporosis and menopause Osteoporosis is a disease in which the bones lose minerals and strength with aging. This can result in bone fractures. If you are 65 years old or older, or if you are at risk for osteoporosis and fractures, ask your health care provider if you should:  Be screened for bone loss.  Take a calcium or vitamin D supplement to lower your risk of fractures.  Be given hormone replacement therapy (HRT) to treat symptoms of menopause. Follow these instructions at home: Lifestyle  Do not use any products that contain nicotine or tobacco, such as cigarettes, e-cigarettes, and chewing tobacco. If you need help quitting, ask your health care provider.  Do not use street drugs.  Do not share needles.  Ask your health care provider for help if you need support or information about quitting drugs. Alcohol use  Do not drink alcohol if: ? Your health care provider tells you not to drink. ? You are pregnant, may be pregnant, or are planning to become pregnant.  If you drink alcohol: ? Limit how much you use to 0-1 drink a day. ? Limit intake if you are breastfeeding.  Be aware of how much alcohol is in your drink. In the [place] one drink equals one 12 oz bottle of beer (355 mL), one 5 oz glass of wine (148 mL), or one 1 oz glass of hard liquor (44 mL). General instructions  Schedule regular health, dental, and eye exams.  Stay current with your vaccines.  Tell your health care provider if: ? You often feel depressed. ? You have ever been abused or do not feel safe at home. Summary  Adopting a healthy lifestyle and getting preventive care are important in promoting health and wellness.  Follow your health care provider's instructions about healthy  diet, exercising, and getting tested or screened for diseases.  Follow your health care provider's instructions on monitoring your cholesterol and blood pressure. This information is not intended to replace advice given to you by your health care provider. Make sure you discuss any questions you have with your health care provider. Document Released: 02/28/2011 Document Revised: 08/08/2018 Document Reviewed: 08/08/2018 Elsevier Patient Education  2020 [employer]  

## 2019-07-04 NOTE — Progress Notes (Signed)
Subjective:    Patient ID: Margaret Carlson, female    DOB: 10-01-1975, 43 y.o.   MRN: [id number]  HPI  Pt presents to the clinic today for her annual exam. She is also due to follow up chronic conditions.  HTN: Her BP today is 128/86. She is taking Amlodipine and Losartan as prescribed. ECG from 02/2015 reviewed.  HLD: Her last LDL was 109, 05/2018. She denies myalgias on Rosuvastatin. She tries to consume a low fat diet.  Flu: 04/2019 Tetanus: 02/2015 Pap Smear: 08/2018 Mammogram: 05/2019 Vision Screening: Annually  Dentist: Biannually  Diet: She does eat meats. She eats strawberries and green veggies. She avoids fried foods. She drinks mostly water.  Exercise: She does an hour of cardio and weight train 6 days a week.     Review of Systems      Past Medical History:  Diagnosis Date  . Hypertension     Current Outpatient Medications  Medication Sig Dispense Refill  . amLODipine (NORVASC) 10 MG tablet TAKE 1 TABLET BY MOUTH  DAILY. 90 tablet 2  . Calcium-Vitamin D-Vitamin K (CALCIUM + D + K PO) Take 1 capsule by mouth daily.    . ciprofloxacin-dexamethasone (CIPRODEX) OTIC suspension Apply 1-2 drops to toe after soaking BIC 7.5 mL 2  . cyanocobalamin 2000 MCG tablet Take 2,000 mcg by mouth daily.    . hyoscyamine (LEVSIN SL) 0.125 MG SL tablet Place 1 tablet (0.125 mg total) under the tongue every 6 (six) hours as needed. For bloating and belching 30 tablet 2  . losartan (COZAAR) 25 MG tablet TAKE 1 TABLET BY MOUTH  DAILY 90 tablet 0  . Multiple Vitamin (MULTIVITAMIN) tablet Take 1 tablet by mouth daily.    . Norethindrone Acetate-Ethinyl Estradiol (JUNEL 1.5/30) 1.5-30 MG-MCG tablet TAKE 1 TABLET BY MOUTH  EVERY DAY FOR 3 WEEKS THEN  OFF FOR 1 WEEK 63 tablet 0  . rosuvastatin (CRESTOR) 10 MG tablet Take 1 tablet (10 mg total) by mouth daily. 90 tablet 3   No current facility-administered medications for this visit.     Allergies  Allergen Reactions  . Ranitidine  Swelling    Face and lip swelling    Family History  Problem Relation Age of Onset  . Arthritis Maternal Grandmother   . Hypertension Maternal Grandmother   . Breast cancer Maternal Grandmother 77    Social History   Socioeconomic History  . Marital status: Single    Spouse name: Not on file  . Number of children: Not on file  . Years of education: Not on file  . Highest education level: Not on file  Occupational History  . Not on file  Social Needs  . Financial resource strain: Not on file  . Food insecurity    Worry: Not on file    Inability: Not on file  . Transportation needs    Medical: Not on file    Non-medical: Not on file  Tobacco Use  . Smoking status: Never Smoker  . Smokeless tobacco: Never Used  Substance and Sexual Activity  . Alcohol use: No    Alcohol/week: 0.0 standard drinks  . Drug use: No  . Sexual activity: Yes    Partners: Male    Birth control/protection: Pill  Lifestyle  . Physical activity    Days per week: Not on file    Minutes per session: Not on file  . Stress: Not on file  Relationships  . Social Herbalist on  phone: Not on file    Gets together: Not on file    Attends religious service: Not on file    Active member of club or organization: Not on file    Attends meetings of clubs or organizations: Not on file    Relationship status: Not on file  . Intimate partner violence    Fear of current or ex partner: Not on file    Emotionally abused: Not on file    Physically abused: Not on file    Forced sexual activity: Not on file  Other Topics Concern  . Not on file  Social History Narrative  . Not on file     Constitutional: Denies fever, malaise, fatigue, headache or abrupt weight changes.  HEENT: Denies eye pain, eye redness, ear pain, ringing in the ears, wax buildup, runny nose, nasal congestion, bloody nose, or sore throat. Respiratory: Denies difficulty breathing, shortness of breath, cough or sputum  production.   Cardiovascular: Denies chest pain, chest tightness, palpitations or swelling in the hands or feet.  Gastrointestinal: Denies abdominal pain, bloating, constipation, diarrhea or blood in the stool.  GU: Denies urgency, frequency, pain with urination, burning sensation, blood in urine, odor or discharge. Musculoskeletal: Denies decrease in range of motion, difficulty with gait, muscle pain or joint pain and swelling.  Skin: Denies redness, rashes, lesions or ulcercations.  Neurological: Denies dizziness, difficulty with memory, difficulty with speech or problems with balance and coordination.  Psych: Denies anxiety, depression, SI/HI.  No other specific complaints in a complete review of systems (except as listed in HPI above).  Objective:   Physical Exam   BP 128/86   Pulse 62   Temp 98.1 F (36.7 C) (Temporal)   Ht 5\' 5"  (1.651 m)   Wt 285 lb (129.3 kg)   LMP 06/20/2019   SpO2 98%   BMI 47.43 kg/m  Wt Readings from Last 3 Encounters:  07/04/19 285 lb (129.3 kg)  05/23/19 293 lb (132.9 kg)  05/09/19 290 lb (131.5 kg)    General: Appears her stated age, NAD, obese Skin: Warm, dry and intact. No rashes, lesions or ulcerations noted. HEENT: Head: normal shape and size; Eyes: sclera white, no icterus, conjunctiva pink and EOMs intact  Neck:  Neck supple, trachea midline. No masses, lumps or thyromegaly present.  Cardiovascular: Normal rate and rhythm. S1,S2 noted.  No murmur, rubs or gallops noted. No JVD or BLE edema. Pulmonary/Chest: Normal effort and positive vesicular breath sounds. No respiratory distress. No wheezes, rales or ronchi noted.  Abdomen: Soft and nontender. Normal bowel sounds. No distention or masses noted. Liver, spleen and kidneys non palpable. Pelvic: Normal female anatomy. Cervix without lesions. Small amount of thin white discharge noted, no odor. No CMT. Adnexa non palpable.  Musculoskeletal: Strength 5/5 BUE/BLE. No difficulty with gait.   Neurological: Alert and oriented. Cranial nerves II-XII grossly intact. Coordination normal.  Psychiatric: Mood and affect normal. Behavior is normal. Judgment and thought content normal.    BMET    Component Value Date/Time   NA 141 06/26/2018 1419   NA 137 07/03/2013 2132   K 4.0 06/26/2018 1419   K 3.6 07/03/2013 2132   CL 103 06/26/2018 1419   CL 103 07/03/2013 2132   CO2 24 06/26/2018 1419   CO2 29 07/03/2013 2132   GLUCOSE 86 06/26/2018 1419   GLUCOSE 118 (H) 06/18/2018 1756   GLUCOSE 90 07/03/2013 2132   BUN 16 06/26/2018 1419   BUN 9 07/03/2013 2132  CREATININE 1.21 (H) 06/26/2018 1419   CREATININE 0.85 07/03/2013 2132   CALCIUM 8.8 06/26/2018 1419   CALCIUM 9.0 07/03/2013 2132   GFRNONAA 55 (L) 06/26/2018 1419   GFRNONAA >60 07/03/2013 2132   GFRAA 64 06/26/2018 1419   GFRAA >60 07/03/2013 2132    Lipid Panel     Component Value Date/Time   CHOL 181 06/05/2018 1217   TRIG 53 06/05/2018 1217   HDL 61 06/05/2018 1217   CHOLHDL 3.0 06/05/2018 1217   LDLCALC 109 (H) 06/05/2018 1217    CBC    Component Value Date/Time   WBC 4.5 05/23/2019 0837   WBC 7.7 06/18/2018 1756   RBC 5.00 05/23/2019 0837   RBC 6.05 (H) 06/18/2018 1756   HGB 12.0 05/23/2019 0837   HCT 36.1 05/23/2019 0837   PLT 292 05/23/2019 0837   MCV 72 (L) 05/23/2019 0837   MCV 73 (L) 07/03/2013 2132   MCH 24.0 (L) 05/23/2019 0837   MCH 23.0 (L) 06/18/2018 1756   MCHC 33.2 05/23/2019 0837   MCHC 33.1 06/18/2018 1756   RDW 18.2 (H) 05/23/2019 0837   RDW 19.1 (H) 07/03/2013 2132   LYMPHSABS 2.5 03/17/2015 1618   EOSABS 0.1 03/17/2015 1618   BASOSABS 0.0 03/17/2015 1618    Hgb A1C Lab Results  Component Value Date   HGBA1C 4.9 06/12/2017           Assessment & Plan:  Preventative Health Maintenance:  Flu shot UTD Tetanus UTD Pap Smear: Done today with STD screening Mammogram UTD Encouraged her to consume a balanced diet and exercise regimen Advised her to see an eye  doctor and dentist annually Will check CBC, CMET, Lipid, A1C, Vit B12 and Vit D today  RTC in 1 year, sooner if needed Webb Silversmith, NP

## 2019-07-04 NOTE — Assessment & Plan Note (Addendum)
Continue Amlodipine and Losartan  CMET today Reinforced DASH diet and exercise for weight loss

## 2019-07-04 NOTE — Assessment & Plan Note (Addendum)
CMET and lipid profile Continue Rosuvastatin as prescribed Continue consuming low fat diet

## 2019-07-05 LAB — COMPREHENSIVE METABOLIC PANEL
ALT: 11 IU/L (ref 0–32)
AST: 15 IU/L (ref 0–40)
Albumin/Globulin Ratio: 1.2 (ref 1.2–2.2)
Albumin: 3.5 g/dL — ABNORMAL LOW (ref 3.8–4.8)
Alkaline Phosphatase: 59 IU/L (ref 39–117)
BUN/Creatinine Ratio: 22 (ref 9–23)
BUN: 20 mg/dL (ref 6–24)
Bilirubin Total: 0.2 mg/dL (ref 0.0–1.2)
CO2: 22 mmol/L (ref 20–29)
Calcium: 8.7 mg/dL (ref 8.7–10.2)
Chloride: 102 mmol/L (ref 96–106)
Creatinine, Ser: 0.9 mg/dL (ref 0.57–1.00)
GFR calc Af Amer: 91 mL/min/{1.73_m2} (ref >59)
GFR calc non Af Amer: 79 mL/min/{1.73_m2} (ref >59)
Globulin, Total: 3 g/dL (ref 1.5–4.5)
Glucose: 82 mg/dL (ref 65–99)
Potassium: 3.8 mmol/L (ref 3.5–5.2)
Sodium: 137 mmol/L (ref 134–144)
Total Protein: 6.5 g/dL (ref 6.0–8.5)

## 2019-07-05 LAB — LIPID PANEL
Chol/HDL Ratio: 3.2 ratio (ref 0.0–4.4)
Cholesterol, Total: 139 mg/dL (ref 100–199)
HDL: 43 mg/dL (ref >39)
LDL Chol Calc (NIH): 86 mg/dL (ref 0–99)
Triglycerides: 45 mg/dL (ref 0–149)
VLDL Cholesterol Cal: 10 mg/dL (ref 5–40)

## 2019-07-05 LAB — CBC
Hematocrit: 36.3 % (ref 34.0–46.6)
Hemoglobin: 12.2 g/dL (ref 11.1–15.9)
MCH: 24.3 pg — ABNORMAL LOW (ref 26.6–33.0)
MCHC: 33.6 g/dL (ref 31.5–35.7)
MCV: 72 fL — ABNORMAL LOW (ref 79–97)
Platelets: 284 10*3/uL (ref 150–450)
RBC: 5.03 x10E6/uL (ref 3.77–5.28)
RDW: 18.9 % — ABNORMAL HIGH (ref 11.7–15.4)
WBC: 5 10*3/uL (ref 3.4–10.8)

## 2019-07-05 LAB — HEMOGLOBIN A1C
Est. average glucose Bld gHb Est-mCnc: 100 mg/dL
Hgb A1c MFr Bld: 5.1 % (ref 4.8–5.6)

## 2019-07-05 LAB — VITAMIN D 25 HYDROXY (VIT D DEFICIENCY, FRACTURES): Vit D, 25-Hydroxy: 26.6 ng/mL — ABNORMAL LOW (ref 30.0–100.0)

## 2019-07-05 LAB — VITAMIN B12: Vitamin B-12: 284 pg/mL (ref 232–1245)

## 2019-07-12 ENCOUNTER — Other Ambulatory Visit: Payer: Self-pay | Admitting: Internal Medicine

## 2019-07-12 LAB — PAP LB, CT-NG TV HPV-HR
Chlamydia, Nuc. Acid Amp: NEGATIVE
Gonococcus, Nuc. Acid Amp: NEGATIVE
HPV, high-risk: NEGATIVE
Trich vag by NAA: NEGATIVE

## 2019-07-17 ENCOUNTER — Ambulatory Visit: Payer: Managed Care, Other (non HMO) | Admitting: Podiatry

## 2019-07-22 ENCOUNTER — Other Ambulatory Visit: Payer: Self-pay | Admitting: Internal Medicine

## 2019-08-01 ENCOUNTER — Other Ambulatory Visit: Payer: Self-pay | Admitting: Internal Medicine

## 2019-08-01 DIAGNOSIS — Z01419 Encounter for gynecological examination (general) (routine) without abnormal findings: Secondary | ICD-10-CM

## 2019-08-16 ENCOUNTER — Ambulatory Visit: Payer: Managed Care, Other (non HMO) | Admitting: Internal Medicine

## 2019-08-16 ENCOUNTER — Telehealth: Payer: Self-pay

## 2019-08-16 NOTE — Telephone Encounter (Signed)
I spoke with pt;pt has runny nose and her mom has some symptoms and both want to be tested. pts mom will ck with her doctor. Pt has runny nose as only covid symptom, no travel and no known exposure to + covid. Pt scheduled virtual appt 08/16/19 at 3 PM with Avie Echevaria NP.

## 2019-08-16 NOTE — Telephone Encounter (Signed)
[place] Night - Client Nonclinical Telephone Record AccessNurse Client [facility] Night - Client Client Site [place] Physician Webb Silversmith - NP Contact Type Call Who Is Calling Patient / Member / Family / Caregiver Caller Name Milayah Hesseltine Phone Number [phone] Patient Name Margaret Carlson Patient DOB 08-07-1976 Call Type Message Only Information Provided Reason for Call Request for General Office Information Initial Comment Needs to know where to go to get tested for covid. Pls call back Additional Comment Disp. Time Disposition Final User 08/16/2019 7:40:30 AM General Information Provided Yes Donato Heinz Call Closed By: Donato Heinz Transaction Date/Time: 08/16/2019 7:37:59 AM (ET)

## 2019-08-16 NOTE — Telephone Encounter (Signed)
Spoke with pt. Already had appt to get COVID test. Appt cancelled

## 2019-08-16 NOTE — Progress Notes (Signed)
Erroneous enc

## 2019-08-22 ENCOUNTER — Other Ambulatory Visit: Payer: Self-pay | Admitting: Internal Medicine

## 2019-12-18 ENCOUNTER — Ambulatory Visit (INDEPENDENT_AMBULATORY_CARE_PROVIDER_SITE_OTHER): Payer: Managed Care, Other (non HMO)

## 2019-12-18 ENCOUNTER — Encounter: Payer: Self-pay | Admitting: Podiatry

## 2019-12-18 ENCOUNTER — Ambulatory Visit: Payer: Managed Care, Other (non HMO)

## 2019-12-18 ENCOUNTER — Other Ambulatory Visit: Payer: Self-pay

## 2019-12-18 ENCOUNTER — Ambulatory Visit: Payer: Managed Care, Other (non HMO) | Admitting: Podiatry

## 2019-12-18 DIAGNOSIS — M722 Plantar fascial fibromatosis: Secondary | ICD-10-CM

## 2019-12-18 MED ORDER — METHYLPREDNISOLONE 4 MG PO TBPK: ORAL_TABLET | ORAL | 0 refills | Status: DC

## 2019-12-18 MED ORDER — MELOXICAM 15 MG PO TABS: 15.0000 mg | ORAL_TABLET | Freq: Every day | ORAL | 3 refills | Status: DC

## 2019-12-18 NOTE — Progress Notes (Signed)
She presents today for a flareup of her plantar fasciitis left foot x1 month.  States that she is really been in a lot of pain particularly in the mornings when I stand up from sitting after have been sitting for a while starts to throb and after work.  Objective: Vital signs are stable alert and oriented x3.  Pulses are palpable.  She has pain on palpation mid calcaneal tubercle of the left foot.  No open lesions or wounds.  Assessment: Pain in limb secondary plantar fasciitis.  Plan: Injected left heel today 20 mg Kenalog 5 mg Marcaine point maximal tenderness.

## 2019-12-27 ENCOUNTER — Encounter: Payer: Self-pay | Admitting: Internal Medicine

## 2020-01-02 ENCOUNTER — Encounter: Payer: Self-pay | Admitting: Internal Medicine

## 2020-01-02 ENCOUNTER — Other Ambulatory Visit: Payer: Self-pay

## 2020-01-02 ENCOUNTER — Ambulatory Visit: Payer: Managed Care, Other (non HMO) | Admitting: Internal Medicine

## 2020-01-02 DIAGNOSIS — I10 Essential (primary) hypertension: Secondary | ICD-10-CM

## 2020-01-02 NOTE — Assessment & Plan Note (Signed)
Will hold Losartan, continue Amlodipine Encouraged low salt diet, continued exercise for weight loss

## 2020-01-02 NOTE — Progress Notes (Signed)
Subjective:    Patient ID: Margaret Carlson, female    DOB: 05/17/1976, 44 y.o.   MRN: [id number]  HPI  Pt presents to the clinic today for follow up of HTN. She reports she has lost about 30 lbs. She is wondering if her blood pressure medications need to be adjusted. She is taking Amlodipine and Losartan as prescribed. Her BP today is 134/84 (she did not take any blood pressure medication this morning). ECG from 02/2015 reviewed.  Review of Systems  Past Medical History:  Diagnosis Date  . Hypertension     Current Outpatient Medications  Medication Sig Dispense Refill  . amLODipine (NORVASC) 10 MG tablet TAKE 1 TABLET BY MOUTH  DAILY. 90 tablet 2  . Calcium-Vitamin D-Vitamin K (CALCIUM + D + K PO) Take 1 capsule by mouth daily.    . ciprofloxacin-dexamethasone (CIPRODEX) OTIC suspension Apply 1-2 drops to toe after soaking BIC 7.5 mL 2  . cyanocobalamin 2000 MCG tablet Take 2,000 mcg by mouth daily.    . hyoscyamine (LEVSIN SL) 0.125 MG SL tablet Place 1 tablet (0.125 mg total) under the tongue every 6 (six) hours as needed. For bloating and belching 30 tablet 2  . losartan (COZAAR) 25 MG tablet TAKE 1 TABLET BY MOUTH  DAILY 90 tablet 2  . meloxicam (MOBIC) 15 MG tablet Take 1 tablet (15 mg total) by mouth daily. 30 tablet 3  . methylPREDNISolone (MEDROL DOSEPAK) 4 MG TBPK tablet 6 day dose pack - take as directed 21 tablet 0  . Multiple Vitamin (MULTIVITAMIN) tablet Take 1 tablet by mouth daily.    . Norethindrone Acetate-Ethinyl Estradiol (JUNEL 1.5/30) 1.5-30 MG-MCG tablet TAKE 1 TABLET BY MOUTH   EVERY DAY FOR 3 WEEKS THEN  OFF FOR 1 WEEK 63 tablet 2  . rosuvastatin (CRESTOR) 10 MG tablet TAKE 1 TABLET BY MOUTH  DAILY 90 tablet 2   No current facility-administered medications for this visit.    Allergies  Allergen Reactions  . Ranitidine Swelling    Face and lip swelling    Family History  Problem Relation Age of Onset  . Arthritis Maternal Grandmother   . Hypertension  Maternal Grandmother   . Breast cancer Maternal Grandmother 77    Social History   Socioeconomic History  . Marital status: Single    Spouse name: Not on file  . Number of children: Not on file  . Years of education: Not on file  . Highest education level: Not on file  Occupational History  . Not on file  Tobacco Use  . Smoking status: Never Smoker  . Smokeless tobacco: Never Used  Substance and Sexual Activity  . Alcohol use: No    Alcohol/week: 0.0 standard drinks  . Drug use: No  . Sexual activity: Yes    Partners: Male    Birth control/protection: Pill  Other Topics Concern  . Not on file  Social History Narrative  . Not on file   Social Determinants of Health   Financial Resource Strain:   . Difficulty of Paying Living Expenses:   Food Insecurity:   . Worried About Charity fundraiser in the Last Year:   . Arboriculturist in the Last Year:   Transportation Needs:   . Film/video editor (Medical):   Marland Kitchen Lack of Transportation (Non-Medical):   Physical Activity:   . Days of Exercise per Week:   . Minutes of Exercise per Session:   Stress:   . Feeling  of Stress :   Social Connections:   . Frequency of Communication with Friends and Family:   . Frequency of Social Gatherings with Friends and Family:   . Attends Religious Services:   . Active Member of Clubs or Organizations:   . Attends Archivist Meetings:   Marland Kitchen Marital Status:   Intimate Partner Violence:   . Fear of Current or Ex-Partner:   . Emotionally Abused:   Marland Kitchen Physically Abused:   . Sexually Abused:      Constitutional: Denies fever, malaise, fatigue, headache or abrupt weight changes.  Respiratory: Denies difficulty breathing, shortness of breath, cough or sputum production.   Cardiovascular: Denies chest pain, chest tightness, palpitations or swelling in the hands or feet.  Neurological: Denies dizziness, difficulty with memory, difficulty with speech or problems with balance and  coordination.    No other specific complaints in a complete review of systems (except as listed in HPI above).     Objective:   Physical Exam  BP 134/84   Pulse 79   Temp 98.2 F (36.8 C) (Temporal)   Wt 257 lb (116.6 kg)   SpO2 98%   BMI 42.77 kg/m   Wt Readings from Last 3 Encounters:  07/04/19 285 lb (129.3 kg)  05/23/19 293 lb (132.9 kg)  05/09/19 290 lb (131.5 kg)    General: Appears her stated age, obese, in NAD. Cardiovascular: Normal rate and rhythm. S1,S2 noted.  No murmur, rubs or gallops noted.  Pulmonary/Chest: Normal effort and positive vesicular breath sounds. No respiratory distress. No wheezes, rales or ronchi noted.  Neurological: Alert and oriented.    BMET    Component Value Date/Time   NA 137 07/04/2019 1123   NA 137 07/03/2013 2132   K 3.8 07/04/2019 1123   K 3.6 07/03/2013 2132   CL 102 07/04/2019 1123   CL 103 07/03/2013 2132   CO2 22 07/04/2019 1123   CO2 29 07/03/2013 2132   GLUCOSE 82 07/04/2019 1123   GLUCOSE 118 (H) 06/18/2018 1756   GLUCOSE 90 07/03/2013 2132   BUN 20 07/04/2019 1123   BUN 9 07/03/2013 2132   CREATININE 0.90 07/04/2019 1123   CREATININE 0.85 07/03/2013 2132   CALCIUM 8.7 07/04/2019 1123   CALCIUM 9.0 07/03/2013 2132   GFRNONAA 79 07/04/2019 1123   GFRNONAA >60 07/03/2013 2132   GFRAA 91 07/04/2019 1123   GFRAA >60 07/03/2013 2132    Lipid Panel     Component Value Date/Time   CHOL 139 07/04/2019 1123   TRIG 45 07/04/2019 1123   HDL 43 07/04/2019 1123   CHOLHDL 3.2 07/04/2019 1123   LDLCALC 86 07/04/2019 1123    CBC    Component Value Date/Time   WBC 5.0 07/04/2019 1123   WBC 7.7 06/18/2018 1756   RBC 5.03 07/04/2019 1123   RBC 6.05 (H) 06/18/2018 1756   HGB 12.2 07/04/2019 1123   HCT 36.3 07/04/2019 1123   PLT 284 07/04/2019 1123   MCV 72 (L) 07/04/2019 1123   MCV 73 (L) 07/03/2013 2132   MCH 24.3 (L) 07/04/2019 1123   MCH 23.0 (L) 06/18/2018 1756   MCHC 33.6 07/04/2019 1123   MCHC 33.1  06/18/2018 1756   RDW 18.9 (H) 07/04/2019 1123   RDW 19.1 (H) 07/03/2013 2132   LYMPHSABS 2.5 03/17/2015 1618   EOSABS 0.1 03/17/2015 1618   BASOSABS 0.0 03/17/2015 1618    Hgb A1C Lab Results  Component Value Date   HGBA1C 5.1 07/04/2019  Assessment & Plan:    Webb Silversmith, NP This visit occurred during the SARS-CoV-2 public health emergency.  Safety protocols were in place, including screening questions prior to the visit, additional usage of staff PPE, and extensive cleaning of exam room while observing appropriate contact time as indicated for disinfecting solutions.

## 2020-01-02 NOTE — Patient Instructions (Signed)

## 2020-01-10 ENCOUNTER — Encounter: Payer: Self-pay | Admitting: Internal Medicine

## 2020-01-10 NOTE — Telephone Encounter (Signed)
Acyclovir 400 mg is the dose I can find in the previous historical medication.

## 2020-01-10 NOTE — Telephone Encounter (Signed)
This will be taken care of at her appt Tues. If I have not treated her before, I would like to talk to her about it first.

## 2020-01-14 ENCOUNTER — Encounter: Payer: Self-pay | Admitting: Internal Medicine

## 2020-01-14 ENCOUNTER — Other Ambulatory Visit: Payer: Self-pay

## 2020-01-14 ENCOUNTER — Ambulatory Visit: Payer: Managed Care, Other (non HMO) | Admitting: Internal Medicine

## 2020-01-14 VITALS — BP 142/92 | HR 72 | Temp 97.5°F | Wt 265.0 lb

## 2020-01-14 DIAGNOSIS — A6004 Herpesviral vulvovaginitis: Secondary | ICD-10-CM | POA: Diagnosis not present

## 2020-01-14 DIAGNOSIS — I10 Essential (primary) hypertension: Secondary | ICD-10-CM

## 2020-01-14 MED ORDER — ACYCLOVIR 400 MG PO TABS: 400.0000 mg | ORAL_TABLET | Freq: Three times a day (TID) | ORAL | 1 refills | Status: DC

## 2020-01-14 NOTE — Progress Notes (Signed)
Subjective:    Patient ID: Margaret Carlson, female    DOB: 1976/05/22, 44 y.o.   MRN: [id number]  HPI  Patient presents to the clinic today for 2-week follow-up of hypertension.  Her last visit, her Losartan was held due to weight loss, and she was advised to continue her Amlodipine.  Her BP today is 142/92.  ECG from 02/2015 reviewed.  She would also like to be retested for genital herpes.  She reports she had a positive test in 2017.  She reports a possible outbreak about 5 days ago.  She did take some Acyclovir that she had leftover but reports this was expired.  She would like a refill of her Acyclovir today.  Review of Systems      Past Medical History:  Diagnosis Date  . Hypertension     Current Outpatient Medications  Medication Sig Dispense Refill  . amLODipine (NORVASC) 10 MG tablet TAKE 1 TABLET BY MOUTH  DAILY. 90 tablet 2  . Calcium-Vitamin D-Vitamin K (CALCIUM + D + K PO) Take 1 capsule by mouth daily.    . cyanocobalamin 2000 MCG tablet Take 2,000 mcg by mouth daily.    . hyoscyamine (LEVSIN SL) 0.125 MG SL tablet Place 1 tablet (0.125 mg total) under the tongue every 6 (six) hours as needed. For bloating and belching 30 tablet 2  . losartan (COZAAR) 25 MG tablet TAKE 1 TABLET BY MOUTH  DAILY 90 tablet 2  . meloxicam (MOBIC) 15 MG tablet Take 1 tablet (15 mg total) by mouth daily. 30 tablet 3  . Multiple Vitamin (MULTIVITAMIN) tablet Take 1 tablet by mouth daily.    . Norethindrone Acetate-Ethinyl Estradiol (JUNEL 1.5/30) 1.5-30 MG-MCG tablet TAKE 1 TABLET BY MOUTH   EVERY DAY FOR 3 WEEKS THEN  OFF FOR 1 WEEK 63 tablet 2  . rosuvastatin (CRESTOR) 10 MG tablet TAKE 1 TABLET BY MOUTH  DAILY 90 tablet 2   No current facility-administered medications for this visit.    Allergies  Allergen Reactions  . Ranitidine Swelling    Face and lip swelling    Family History  Problem Relation Age of Onset  . Arthritis Maternal Grandmother   . Hypertension Maternal  Grandmother   . Breast cancer Maternal Grandmother 77    Social History   Socioeconomic History  . Marital status: Single    Spouse name: Not on file  . Number of children: Not on file  . Years of education: Not on file  . Highest education level: Not on file  Occupational History  . Not on file  Tobacco Use  . Smoking status: Never Smoker  . Smokeless tobacco: Never Used  Substance and Sexual Activity  . Alcohol use: No    Alcohol/week: 0.0 standard drinks  . Drug use: No  . Sexual activity: Yes    Partners: Male    Birth control/protection: Pill  Other Topics Concern  . Not on file  Social History Narrative  . Not on file   Social Determinants of Health   Financial Resource Strain:   . Difficulty of Paying Living Expenses:   Food Insecurity:   . Worried About Charity fundraiser in the Last Year:   . Arboriculturist in the Last Year:   Transportation Needs:   . Film/video editor (Medical):   Marland Kitchen Lack of Transportation (Non-Medical):   Physical Activity:   . Days of Exercise per Week:   . Minutes of Exercise per Session:  Stress:   . Feeling of Stress :   Social Connections:   . Frequency of Communication with Friends and Family:   . Frequency of Social Gatherings with Friends and Family:   . Attends Religious Services:   . Active Member of Clubs or Organizations:   . Attends Archivist Meetings:   Marland Kitchen Marital Status:   Intimate Partner Violence:   . Fear of Current or Ex-Partner:   . Emotionally Abused:   Marland Kitchen Physically Abused:   . Sexually Abused:      Constitutional: Denies fever, malaise, fatigue, headache or abrupt weight changes.  Respiratory: Denies difficulty breathing, shortness of breath, cough or sputum production.   Cardiovascular: Denies chest pain, chest tightness, palpitations or swelling in the hands or feet.  Skin: Denies redness, rashes, lesions or ulcerations at this time. Neurological: Denies dizziness, difficulty with  memory, difficulty with speech or problems with balance and coordination.    No other specific complaints in a complete review of systems (except as listed in HPI above).  Objective:   Physical Exam   BP (!) 142/92   Pulse 72   Temp (!) 97.5 F (36.4 C) (Temporal)   Wt 265 lb (120.2 kg)   SpO2 98%   BMI 44.10 kg/m  , And she will Wt Readings from Last 3 Encounters:  01/02/20 257 lb (116.6 kg)  07/04/19 285 lb (129.3 kg)  05/23/19 293 lb (132.9 kg)    General: Appears her stated age, obese, in NAD. Skin: Warm, dry and intact. No rashes, lesions or ulcerations noted. Cardiovascular: Normal rate and rhythm. S1,S2 noted.  No murmur, rubs or gallops noted.  Pulmonary/Chest: Normal effort and positive vesicular breath sounds. No respiratory distress. No wheezes, rales or ronchi noted.  Neurological: Alert and oriented.     BMET    Component Value Date/Time   NA 137 07/04/2019 1123   NA 137 07/03/2013 2132   K 3.8 07/04/2019 1123   K 3.6 07/03/2013 2132   CL 102 07/04/2019 1123   CL 103 07/03/2013 2132   CO2 22 07/04/2019 1123   CO2 29 07/03/2013 2132   GLUCOSE 82 07/04/2019 1123   GLUCOSE 118 (H) 06/18/2018 1756   GLUCOSE 90 07/03/2013 2132   BUN 20 07/04/2019 1123   BUN 9 07/03/2013 2132   CREATININE 0.90 07/04/2019 1123   CREATININE 0.85 07/03/2013 2132   CALCIUM 8.7 07/04/2019 1123   CALCIUM 9.0 07/03/2013 2132   GFRNONAA 79 07/04/2019 1123   GFRNONAA >60 07/03/2013 2132   GFRAA 91 07/04/2019 1123   GFRAA >60 07/03/2013 2132    Lipid Panel     Component Value Date/Time   CHOL 139 07/04/2019 1123   TRIG 45 07/04/2019 1123   HDL 43 07/04/2019 1123   CHOLHDL 3.2 07/04/2019 1123   LDLCALC 86 07/04/2019 1123    CBC    Component Value Date/Time   WBC 5.0 07/04/2019 1123   WBC 7.7 06/18/2018 1756   RBC 5.03 07/04/2019 1123   RBC 6.05 (H) 06/18/2018 1756   HGB 12.2 07/04/2019 1123   HCT 36.3 07/04/2019 1123   PLT 284 07/04/2019 1123   MCV 72 (L)  07/04/2019 1123   MCV 73 (L) 07/03/2013 2132   MCH 24.3 (L) 07/04/2019 1123   MCH 23.0 (L) 06/18/2018 1756   MCHC 33.6 07/04/2019 1123   MCHC 33.1 06/18/2018 1756   RDW 18.9 (H) 07/04/2019 1123   RDW 19.1 (H) 07/03/2013 2132   LYMPHSABS 2.5 03/17/2015 1618  EOSABS 0.1 03/17/2015 1618   BASOSABS 0.0 03/17/2015 1618    Hgb A1C Lab Results  Component Value Date   HGBA1C 5.1 07/04/2019           Assessment & Plan:   Webb Silversmith, NP This visit occurred during the SARS-CoV-2 public health emergency.  Safety protocols were in place, including screening questions prior to the visit, additional usage of staff PPE, and extensive cleaning of exam room while observing appropriate contact time as indicated for disinfecting solutions.

## 2020-01-14 NOTE — Addendum Note (Signed)
Addended by: Ellamae Sia on: 01/14/2020 08:31 AM   Modules accepted: Orders

## 2020-01-14 NOTE — Patient Instructions (Signed)

## 2020-01-14 NOTE — Assessment & Plan Note (Signed)
Uncontrolled We will need to restart Losartan in addition to Amlodipine Reinforced DASH diet and exercise for weight loss We will monitor

## 2020-01-14 NOTE — Assessment & Plan Note (Signed)
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

## 2020-01-16 ENCOUNTER — Ambulatory Visit: Payer: Managed Care, Other (non HMO) | Admitting: Internal Medicine

## 2020-01-17 LAB — HSV(HERPES SIMPLEX VRS) I + II AB-IGG
HSV 1 Glycoprotein G Ab, IgG: <0.91 index (ref 0.00–0.90)
HSV 2 IgG, Type Spec: 7.24 index — ABNORMAL HIGH (ref 0.00–0.90)

## 2020-01-17 LAB — HSV(HERPES SIMPLEX VRS) I + II AB-IGM: HSVI/II Comb IgM: 0.96 Ratio — ABNORMAL HIGH (ref 0.00–0.90)

## 2020-01-20 ENCOUNTER — Other Ambulatory Visit: Payer: Self-pay

## 2020-01-20 ENCOUNTER — Ambulatory Visit: Payer: Managed Care, Other (non HMO) | Admitting: Podiatry

## 2020-01-20 DIAGNOSIS — M722 Plantar fascial fibromatosis: Secondary | ICD-10-CM | POA: Diagnosis not present

## 2020-01-20 NOTE — Progress Notes (Signed)
She presents today for follow-up of her plantar fasciitis left.  She states that usually the shots really helped this but nothing is making any better.  Objective: Vital signs are stable alert and oriented x3.  Pulses are palpable.  Status severe pain palpation mid calcaneal tubercle of the left heel.  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

## 2020-01-29 ENCOUNTER — Other Ambulatory Visit: Payer: Self-pay

## 2020-01-29 ENCOUNTER — Ambulatory Visit (INDEPENDENT_AMBULATORY_CARE_PROVIDER_SITE_OTHER): Payer: Managed Care, Other (non HMO) | Admitting: Orthotics

## 2020-01-29 DIAGNOSIS — M722 Plantar fascial fibromatosis: Secondary | ICD-10-CM

## 2020-01-29 NOTE — Progress Notes (Signed)

## 2020-02-26 ENCOUNTER — Encounter (INDEPENDENT_AMBULATORY_CARE_PROVIDER_SITE_OTHER): Payer: Managed Care, Other (non HMO) | Admitting: Orthotics

## 2020-02-26 ENCOUNTER — Other Ambulatory Visit: Payer: Self-pay

## 2020-03-02 ENCOUNTER — Encounter: Payer: Self-pay | Admitting: Internal Medicine

## 2020-03-03 MED ORDER — NORETHINDRONE ACET-ETHINYL EST 1.5-30 MG-MCG PO TABS: ORAL_TABLET | ORAL | 0 refills | Status: DC

## 2020-03-21 ENCOUNTER — Other Ambulatory Visit: Payer: Self-pay | Admitting: Internal Medicine

## 2020-03-24 MED ORDER — NORETHINDRONE ACET-ETHINYL EST 1.5-30 MG-MCG PO TABS: ORAL_TABLET | ORAL | 2 refills | Status: DC

## 2020-03-24 NOTE — Addendum Note (Signed)
Addended by: Lurlean Nanny on: 03/24/2020 11:19 AM   Modules accepted: Orders

## 2020-04-15 ENCOUNTER — Other Ambulatory Visit: Payer: Self-pay | Admitting: Podiatry

## 2020-05-14 ENCOUNTER — Other Ambulatory Visit: Payer: Self-pay

## 2020-05-14 ENCOUNTER — Encounter: Payer: Self-pay | Admitting: Internal Medicine

## 2020-05-14 ENCOUNTER — Ambulatory Visit: Payer: Managed Care, Other (non HMO) | Admitting: Internal Medicine

## 2020-05-14 VITALS — BP 140/86 | HR 84

## 2020-05-14 DIAGNOSIS — R61 Generalized hyperhidrosis: Secondary | ICD-10-CM

## 2020-05-14 DIAGNOSIS — Z0289 Encounter for other administrative examinations: Secondary | ICD-10-CM | POA: Diagnosis not present

## 2020-05-14 DIAGNOSIS — F320 Major depressive disorder, single episode, mild: Secondary | ICD-10-CM | POA: Diagnosis not present

## 2020-05-14 DIAGNOSIS — R232 Flushing: Secondary | ICD-10-CM

## 2020-05-14 DIAGNOSIS — F419 Anxiety disorder, unspecified: Secondary | ICD-10-CM | POA: Insufficient documentation

## 2020-05-14 DIAGNOSIS — Z23 Encounter for immunization: Secondary | ICD-10-CM | POA: Diagnosis not present

## 2020-05-14 MED ORDER — VENLAFAXINE HCL ER 37.5 MG PO CP24: 37.5000 mg | ORAL_CAPSULE | Freq: Every day | ORAL | 2 refills | Status: DC

## 2020-05-14 MED ORDER — NORETHINDRONE ACET-ETHINYL EST 1.5-30 MG-MCG PO TABS: ORAL_TABLET | ORAL | 1 refills | Status: DC

## 2020-05-14 NOTE — Progress Notes (Signed)
Subjective:    Patient ID: Margaret Carlson, female    DOB: 04/28/1976, 44 y.o.   MRN: [id number]  HPI  Patient presents the clinic today for for form completion.  She has a pill form for [facility] for her BMI metric.  Her current weight is 265 pounds with a BMI of 44.1.  She reports her diet has not been great lately because she has been dealing with some depression.  She is not currently exercising.  Depression: She reports she recently moved which has been stressful.  She reports she has not felt the same since that time.  She has been seeing a therapist twice monthly.  She reports her therapist feels like she is depressed.  She has never taken any medications for anxiety or depression in the past.  She denies SI/HI.  She is also concerned about menopause.  She reports mood swings, night sweats and hot flashes.  She reports she is currently having regular periods, on OCPs.  She would like hormone testing today and a refill on her OCP.  Review of Systems      Past Medical History:  Diagnosis Date  . Hypertension     Current Outpatient Medications  Medication Sig Dispense Refill  . acyclovir (ZOVIRAX) 400 MG tablet Take 1 tablet (400 mg total) by mouth 3 (three) times daily. For 5 days during an outbreak 30 tablet 1  . amLODipine (NORVASC) 10 MG tablet TAKE 1 TABLET BY MOUTH  DAILY. 90 tablet 2  . Calcium-Vitamin D-Vitamin K (CALCIUM + D + K PO) Take 1 capsule by mouth daily.    . cyanocobalamin 2000 MCG tablet Take 2,000 mcg by mouth daily.    Marland Kitchen losartan (COZAAR) 25 MG tablet TAKE 1 TABLET BY MOUTH  DAILY 90 tablet 2  . meloxicam (MOBIC) 15 MG tablet Take 1 tablet by mouth once daily 90 tablet 0  . Multiple Vitamin (MULTIVITAMIN) tablet Take 1 tablet by mouth daily.    . Norethindrone Acetate-Ethinyl Estradiol (JUNEL 1.5/30) 1.5-30 MG-MCG tablet TAKE 1 TABLET BY MOUTH   EVERY DAY FOR 3 WEEKS THEN  OFF FOR 1 WEEK 63 tablet 1  . rosuvastatin (CRESTOR) 10 MG tablet TAKE 1 TABLET BY MOUTH   DAILY 90 tablet 0  . venlafaxine XR (EFFEXOR XR) 37.5 MG 24 hr capsule Take 1 capsule (37.5 mg total) by mouth daily with breakfast. 30 capsule 2   No current facility-administered medications for this visit.    Allergies  Allergen Reactions  . Ranitidine Swelling    Face and lip swelling    Family History  Problem Relation Age of Onset  . Arthritis Maternal Grandmother   . Hypertension Maternal Grandmother   . Breast cancer Maternal Grandmother 77    Social History   Socioeconomic History  . Marital status: Single    Spouse name: Not on file  . Number of children: Not on file  . Years of education: Not on file  . Highest education level: Not on file  Occupational History  . Not on file  Tobacco Use  . Smoking status: Never Smoker  . Smokeless tobacco: Never Used  Vaping Use  . Vaping Use: Never used  Substance and Sexual Activity  . Alcohol use: No    Alcohol/week: 0.0 standard drinks  . Drug use: No  . Sexual activity: Yes    Partners: Male    Birth control/protection: Pill  Other Topics Concern  . Not on file  Social History Narrative  .  Not on file   Social Determinants of Health   Financial Resource Strain:   . Difficulty of Paying Living Expenses: Not on file  Food Insecurity:   . Worried About Charity fundraiser in the Last Year: Not on file  . Ran Out of Food in the Last Year: Not on file  Transportation Needs:   . Lack of Transportation (Medical): Not on file  . Lack of Transportation (Non-Medical): Not on file  Physical Activity:   . Days of Exercise per Week: Not on file  . Minutes of Exercise per Session: Not on file  Stress:   . Feeling of Stress : Not on file  Social Connections:   . Frequency of Communication with Friends and Family: Not on file  . Frequency of Social Gatherings with Friends and Family: Not on file  . Attends Religious Services: Not on file  . Active Member of Clubs or Organizations: Not on file  . Attends Theatre manager Meetings: Not on file  . Marital Status: Not on file  Intimate Partner Violence:   . Fear of Current or Ex-Partner: Not on file  . Emotionally Abused: Not on file  . Physically Abused: Not on file  . Sexually Abused: Not on file     Constitutional: Denies fever, malaise, fatigue, headache or abrupt weight changes.  Respiratory: Denies difficulty breathing, shortness of breath, cough or sputum production.   Cardiovascular: Denies chest pain, chest tightness, palpitations or swelling in the hands or feet.  Neurological: Patient reports hot flashes and night sweats.  Denies dizziness, difficulty with memory, difficulty with speech or problems with balance and coordination.  Psych: Patient reports depression.  Denies anxiety, SI/HI.  No other specific complaints in a complete review of systems (except as listed in HPI above).  Objective:   Physical Exam  BP 140/86   Pulse 84  Wt Readings from Last 3 Encounters:  01/14/20 265 lb (120.2 kg)  01/02/20 257 lb (116.6 kg)  07/04/19 285 lb (129.3 kg)    General: Appears her stated age, obese, in NAD. Cardiovascular: Normal rate. Pulmonary/Chest: Normal effort. Neurological: Alert and oriented.  Psychiatric: Mood and affect mildly flat. Behavior is normal. Judgment and thought content normal.    BMET    Component Value Date/Time   NA 137 07/04/2019 1123   NA 137 07/03/2013 2132   K 3.8 07/04/2019 1123   K 3.6 07/03/2013 2132   CL 102 07/04/2019 1123   CL 103 07/03/2013 2132   CO2 22 07/04/2019 1123   CO2 29 07/03/2013 2132   GLUCOSE 82 07/04/2019 1123   GLUCOSE 118 (H) 06/18/2018 1756   GLUCOSE 90 07/03/2013 2132   BUN 20 07/04/2019 1123   BUN 9 07/03/2013 2132   CREATININE 0.90 07/04/2019 1123   CREATININE 0.85 07/03/2013 2132   CALCIUM 8.7 07/04/2019 1123   CALCIUM 9.0 07/03/2013 2132   GFRNONAA 79 07/04/2019 1123   GFRNONAA >60 07/03/2013 2132   GFRAA 91 07/04/2019 1123   GFRAA >60 07/03/2013 2132     Lipid Panel     Component Value Date/Time   CHOL 139 07/04/2019 1123   TRIG 45 07/04/2019 1123   HDL 43 07/04/2019 1123   CHOLHDL 3.2 07/04/2019 1123   LDLCALC 86 07/04/2019 1123    CBC    Component Value Date/Time   WBC 5.0 07/04/2019 1123   WBC 7.7 06/18/2018 1756   RBC 5.03 07/04/2019 1123   RBC 6.05 (H) 06/18/2018 1756  HGB 12.2 07/04/2019 1123   HCT 36.3 07/04/2019 1123   PLT 284 07/04/2019 1123   MCV 72 (L) 07/04/2019 1123   MCV 73 (L) 07/03/2013 2132   MCH 24.3 (L) 07/04/2019 1123   MCH 23.0 (L) 06/18/2018 1756   MCHC 33.6 07/04/2019 1123   MCHC 33.1 06/18/2018 1756   RDW 18.9 (H) 07/04/2019 1123   RDW 19.1 (H) 07/03/2013 2132   LYMPHSABS 2.5 03/17/2015 1618   EOSABS 0.1 03/17/2015 1618   BASOSABS 0.0 03/17/2015 1618    Hgb A1C Lab Results  Component Value Date   HGBA1C 5.1 07/04/2019            Assessment & Plan:   Encounter for Form Completion:  BMI pill form filled out, original given back to patient Recommend 1200-calorie restricted diet with 150 minutes of exercise weekly  Hot Flashes, Night Sweats:  Advised her if she is having regular periods then she is not menopausal and there would be no point in checking her hormone levels today We will start Venlafaxine 37.5 mg p.o. nightly-discussed common side effects  Update me via MyChart in 1 month and let me know how you are doing Webb Silversmith, NP This visit occurred during the SARS-CoV-2 public health emergency.  Safety protocols were in place, including screening questions prior to the visit, additional usage of staff PPE, and extensive cleaning of exam room while observing appropriate contact time as indicated for disinfecting solutions.

## 2020-05-14 NOTE — Assessment & Plan Note (Signed)
Persistent We will start Venlafaxine 37.5 mg p.o. nightly-discussed common side effects Support offered today She will continue to meet with her therapist twice monthly

## 2020-05-14 NOTE — Patient Instructions (Signed)

## 2020-05-24 ENCOUNTER — Other Ambulatory Visit: Payer: Self-pay | Admitting: Internal Medicine

## 2020-06-09 ENCOUNTER — Other Ambulatory Visit: Payer: Self-pay | Admitting: Internal Medicine

## 2020-06-09 DIAGNOSIS — Z1231 Encounter for screening mammogram for malignant neoplasm of breast: Secondary | ICD-10-CM

## 2020-06-10 ENCOUNTER — Other Ambulatory Visit: Payer: Self-pay | Admitting: Internal Medicine

## 2020-06-24 ENCOUNTER — Encounter: Payer: Self-pay | Admitting: Internal Medicine

## 2020-06-28 ENCOUNTER — Other Ambulatory Visit: Payer: Self-pay | Admitting: Internal Medicine

## 2020-06-28 DIAGNOSIS — Z01419 Encounter for gynecological examination (general) (routine) without abnormal findings: Secondary | ICD-10-CM

## 2020-07-06 ENCOUNTER — Other Ambulatory Visit: Payer: Self-pay

## 2020-07-06 ENCOUNTER — Ambulatory Visit
Admission: RE | Admit: 2020-07-06 | Discharge: 2020-07-06 | Disposition: A | Discharge status: Home / Self Care | Payer: Managed Care, Other (non HMO) | Source: Ambulatory Visit | Attending: Internal Medicine | Admitting: Internal Medicine

## 2020-07-06 DIAGNOSIS — Z1231 Encounter for screening mammogram for malignant neoplasm of breast: Secondary | ICD-10-CM | POA: Insufficient documentation

## 2020-07-07 ENCOUNTER — Ambulatory Visit: Payer: Managed Care, Other (non HMO) | Admitting: Internal Medicine

## 2020-07-30 ENCOUNTER — Other Ambulatory Visit: Payer: Self-pay | Admitting: Podiatry

## 2020-07-30 NOTE — Telephone Encounter (Signed)
Please advise 

## 2020-08-11 ENCOUNTER — Other Ambulatory Visit: Payer: Self-pay | Admitting: Internal Medicine

## 2020-08-24 ENCOUNTER — Encounter: Payer: Self-pay | Admitting: Internal Medicine

## 2020-08-24 ENCOUNTER — Other Ambulatory Visit: Payer: Self-pay

## 2020-08-24 ENCOUNTER — Ambulatory Visit (INDEPENDENT_AMBULATORY_CARE_PROVIDER_SITE_OTHER): Payer: Managed Care, Other (non HMO) | Admitting: Internal Medicine

## 2020-08-24 VITALS — BP 146/94 | HR 72 | Temp 97.9°F | Ht 65.0 in | Wt 212.1 lb

## 2020-08-24 DIAGNOSIS — Z1159 Encounter for screening for other viral diseases: Secondary | ICD-10-CM | POA: Diagnosis not present

## 2020-08-24 DIAGNOSIS — Z0001 Encounter for general adult medical examination with abnormal findings: Secondary | ICD-10-CM

## 2020-08-24 DIAGNOSIS — F419 Anxiety disorder, unspecified: Secondary | ICD-10-CM | POA: Diagnosis not present

## 2020-08-24 DIAGNOSIS — E782 Mixed hyperlipidemia: Secondary | ICD-10-CM

## 2020-08-24 DIAGNOSIS — I10 Essential (primary) hypertension: Secondary | ICD-10-CM

## 2020-08-24 DIAGNOSIS — F32A Depression, unspecified: Secondary | ICD-10-CM

## 2020-08-24 DIAGNOSIS — A6004 Herpesviral vulvovaginitis: Secondary | ICD-10-CM | POA: Diagnosis not present

## 2020-08-24 MED ORDER — VENLAFAXINE HCL ER 37.5 MG PO CP24: 37.5000 mg | ORAL_CAPSULE | Freq: Every day | ORAL | 3 refills | Status: DC

## 2020-08-24 NOTE — Progress Notes (Signed)
Subjective:    Patient ID: Margaret Carlson, female    DOB: [birth year], 44 y.o.   MRN: [id number]  HPI  Patient presents the clinic today for her annual exam.  She is also due to follow-up chronic conditions.  HTN: Her BP today is 146/94.  She is taking Losartan and Amlodipine as prescribed.  ECG from 02/2015 reviewed.  HLD: Her last LDL was 86, 06/2019.  She denies myalgias on Rosuvastatin.  She is trying to consume a low-fat diet.  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 She takes Acyclovir as needed with good relief of symptoms.  Anxiety and Depression: Persistent but improved. Managed on Venlafaxine.  She is currently seeing a therapist.  She denies SI/HI.  She would like to discuss alternative methods of birth control. Currently on OCP's.   Flu: 04/2020 Tetanus: 02/2015 Covid: Moderna Pap smear: 06/2019 Mammogram: 06/2020 Vision screening: annually Dentist: biannually  Diet: She does eat meat. She does eat fruits and veggies. She tries to avoid fried foods. She drinks mostly  Water, soda. Exercise: None  Review of Systems      Past Medical History:  Diagnosis Date  . Hypertension     Current Outpatient Medications  Medication Sig Dispense Refill  . acyclovir (ZOVIRAX) 400 MG tablet Take 1 tablet (400 mg total) by mouth 3 (three) times daily. For 5 days during an outbreak 30 tablet 1  . amLODipine (NORVASC) 10 MG tablet TAKE 1 TABLET BY MOUTH  DAILY 90 tablet 0  . Calcium-Vitamin D-Vitamin K (CALCIUM + D + K PO) Take 1 capsule by mouth daily.    . cyanocobalamin 2000 MCG tablet Take 2,000 mcg by mouth daily.    Marland Kitchen losartan (COZAAR) 25 MG tablet TAKE 1 TABLET BY MOUTH  DAILY 90 tablet 0  . meloxicam (MOBIC) 15 MG tablet Take 1 tablet by mouth once daily 90 tablet 0  . Multiple Vitamin (MULTIVITAMIN) tablet Take 1 tablet by mouth daily.    . Norethindrone Acetate-Ethinyl Estradiol (JUNEL 1.5/30) 1.5-30 MG-MCG tablet TAKE 1 TABLET BY MOUTH   EVERY DAY  FOR 3 WEEKS THEN  OFF FOR 1 WEEK 63 tablet 0  . rosuvastatin (CRESTOR) 10 MG tablet TAKE 1 TABLET BY MOUTH  DAILY 90 tablet 0  . venlafaxine XR (EFFEXOR XR) 37.5 MG 24 hr capsule Take 1 capsule (37.5 mg total) by mouth daily with breakfast. 30 capsule 2   No current facility-administered medications for this visit.    Allergies  Allergen Reactions  . Ranitidine Swelling    Face and lip swelling    Family History  Problem Relation Age of Onset  . Arthritis Maternal Grandmother   . Hypertension Maternal Grandmother   . Breast cancer Maternal Grandmother 50    Social History   Socioeconomic History  . Marital status: Single    Spouse name: Not on file  . Number of children: Not on file  . Years of education: Not on file  . Highest education level: Not on file  Occupational History  . Not on file  Tobacco Use  . Smoking status: Never Smoker  . Smokeless tobacco: Never Used  Vaping Use  . Vaping Use: Never used  Substance and Sexual Activity  . Alcohol use: No    Alcohol/week: 0.0 standard drinks  . Drug use: No  . Sexual activity: Yes    Partners: Male    Birth control/protection: Pill  Other Topics Concern  . Not on file  Social History  Narrative  . Not on file   Social Determinants of Health   Financial Resource Strain: Not on file  Food Insecurity: Not on file  Transportation Needs: Not on file  Physical Activity: Not on file  Stress: Not on file  Social Connections: Not on file  Intimate Partner Violence: Not on file     Constitutional: Denies fever, malaise, fatigue, headache or abrupt weight changes.  HEENT: Denies eye pain, eye redness, ear pain, ringing in the ears, wax buildup, runny nose, nasal congestion, bloody nose, or sore throat. Respiratory: Denies difficulty breathing, shortness of breath, cough or sputum production.   Cardiovascular: Denies chest pain, chest tightness, palpitations or swelling in the hands or feet.  Gastrointestinal: Denies  abdominal pain, bloating, constipation, diarrhea or blood in the stool.  GU: Denies urgency, frequency, pain with urination, burning sensation, blood in urine, odor or discharge. Musculoskeletal: Denies decrease in range of motion, difficulty with gait, muscle pain or joint pain and swelling.  Skin: Denies redness, rashes, lesions or ulcercations.  Neurological: Denies dizziness, difficulty with memory, difficulty with speech or problems with balance and coordination.  Psych: Pt has a history of anxiety and depression. Denies SI/HI.  No other specific complaints in a complete review of systems (except as listed in HPI above).  Objective:   Physical Exam   BP (!) 146/94 (BP Location: Right Arm, Patient Position: Sitting, Cuff Size: Large)   Pulse 72   Temp 97.9 F (36.6 C) (Temporal)   Ht 5\' 5"  (1.651 m)   Wt 212 lb 1.9 oz (96.2 kg)   LMP 08/11/2020   SpO2 98%   BMI 35.30 kg/m   Wt Readings from Last 3 Encounters:  01/14/20 265 lb (120.2 kg)  01/02/20 257 lb (116.6 kg)  07/04/19 285 lb (129.3 kg)    General: Appears her stated age, obese in NAD. Skin: Warm, dry and intact. No rashes noted. HEENT: Head: normal shape and size; Eyes: sclera white, no icterus, conjunctiva pink, PERRLA and EOMs intact;  Neck:  Neck supple, trachea midline. No masses, lumps or thyromegaly present.  Cardiovascular: Normal rate and rhythm. S1,S2 noted.  No murmur, rubs or gallops noted. No JVD or BLE edema.  Pulmonary/Chest: Normal effort and positive vesicular breath sounds. No respiratory distress. No wheezes, rales or ronchi noted.  Abdomen: Soft and nontender. Normal bowel sounds. No distention or masses noted. Liver, spleen and kidneys non palpable. Musculoskeletal: Strength 5/5 BUE/BLE. No difficulty with gait.  Neurological: Alert and oriented. Cranial nerves II-XII grossly intact. Coordination normal.  Psychiatric: Mood and affect normal. Behavior is normal. Judgment and thought content normal.     BMET    Component Value Date/Time   NA 137 07/04/2019 1123   NA 137 07/03/2013 2132   K 3.8 07/04/2019 1123   K 3.6 07/03/2013 2132   CL 102 07/04/2019 1123   CL 103 07/03/2013 2132   CO2 22 07/04/2019 1123   CO2 29 07/03/2013 2132   GLUCOSE 82 07/04/2019 1123   GLUCOSE 118 (H) 06/18/2018 1756   GLUCOSE 90 07/03/2013 2132   BUN 20 07/04/2019 1123   BUN 9 07/03/2013 2132   CREATININE 0.90 07/04/2019 1123   CREATININE 0.85 07/03/2013 2132   CALCIUM 8.7 07/04/2019 1123   CALCIUM 9.0 07/03/2013 2132   GFRNONAA 79 07/04/2019 1123   GFRNONAA >60 07/03/2013 2132   GFRAA 91 07/04/2019 1123   GFRAA >60 07/03/2013 2132    Lipid Panel     Component Value Date/Time  CHOL 139 07/04/2019 1123   TRIG 45 07/04/2019 1123   HDL 43 07/04/2019 1123   CHOLHDL 3.2 07/04/2019 1123   LDLCALC 86 07/04/2019 1123    CBC    Component Value Date/Time   WBC 5.0 07/04/2019 1123   WBC 7.7 06/18/2018 1756   RBC 5.03 07/04/2019 1123   RBC 6.05 (H) 06/18/2018 1756   HGB 12.2 07/04/2019 1123   HCT 36.3 07/04/2019 1123   PLT 284 07/04/2019 1123   MCV 72 (L) 07/04/2019 1123   MCV 73 (L) 07/03/2013 2132   MCH 24.3 (L) 07/04/2019 1123   MCH 23.0 (L) 06/18/2018 1756   MCHC 33.6 07/04/2019 1123   MCHC 33.1 06/18/2018 1756   RDW 18.9 (H) 07/04/2019 1123   RDW 19.1 (H) 07/03/2013 2132   LYMPHSABS 2.5 03/17/2015 1618   EOSABS 0.1 03/17/2015 1618   BASOSABS 0.0 03/17/2015 1618    Hgb A1C Lab Results  Component Value Date   HGBA1C 5.1 07/04/2019           Assessment & Plan:   Preventative Health Maintenance:  Flu shot today Tetanus UTD Covid UTD Pap smear UTD Mammogram UTD Will start screening colonoscopy at 75 Encouraged her to consume a balanced diet and exercise regimen Advised her to see an eye doctor and dentist annually Will check CBC, CMET, Lipid, A1C and Hep C today  RTC in 1 year, sooner if needed Webb Silversmith, NP This visit occurred during the SARS-CoV-2 public  health emergency.  Safety protocols were in place, including screening questions prior to the visit, additional usage of staff PPE, and extensive cleaning of exam room while observing appropriate contact time as indicated for disinfecting solutions.

## 2020-08-25 ENCOUNTER — Encounter: Payer: Self-pay | Admitting: Internal Medicine

## 2020-08-25 LAB — LIPID PANEL
Chol/HDL Ratio: 2.7 ratio (ref 0.0–4.4)
Cholesterol, Total: 168 mg/dL (ref 100–199)
HDL: 63 mg/dL (ref >39)
LDL Chol Calc (NIH): 96 mg/dL (ref 0–99)
Triglycerides: 45 mg/dL (ref 0–149)
VLDL Cholesterol Cal: 9 mg/dL (ref 5–40)

## 2020-08-25 LAB — CBC
Hematocrit: 36.8 % (ref 34.0–46.6)
Hemoglobin: 12.3 g/dL (ref 11.1–15.9)
MCH: 24.5 pg — ABNORMAL LOW (ref 26.6–33.0)
MCHC: 33.4 g/dL (ref 31.5–35.7)
MCV: 73 fL — ABNORMAL LOW (ref 79–97)
Platelets: 228 10*3/uL (ref 150–450)
RBC: 5.02 x10E6/uL (ref 3.77–5.28)
RDW: 19.6 % — ABNORMAL HIGH (ref 11.7–15.4)
WBC: 5.5 10*3/uL (ref 3.4–10.8)

## 2020-08-25 LAB — COMPREHENSIVE METABOLIC PANEL
ALT: 6 IU/L (ref 0–32)
AST: 13 IU/L (ref 0–40)
Albumin/Globulin Ratio: 1 — ABNORMAL LOW (ref 1.2–2.2)
Albumin: 3.4 g/dL — ABNORMAL LOW (ref 3.8–4.8)
Alkaline Phosphatase: 56 IU/L (ref 44–121)
BUN/Creatinine Ratio: 13 (ref 9–23)
BUN: 13 mg/dL (ref 6–24)
Bilirubin Total: <0.2 mg/dL (ref 0.0–1.2)
CO2: 22 mmol/L (ref 20–29)
Calcium: 8.3 mg/dL — ABNORMAL LOW (ref 8.7–10.2)
Chloride: 101 mmol/L (ref 96–106)
Creatinine, Ser: 0.97 mg/dL (ref 0.57–1.00)
GFR calc Af Amer: 82 mL/min/{1.73_m2} (ref >59)
GFR calc non Af Amer: 71 mL/min/{1.73_m2} (ref >59)
Globulin, Total: 3.5 g/dL (ref 1.5–4.5)
Glucose: 78 mg/dL (ref 65–99)
Potassium: 3.7 mmol/L (ref 3.5–5.2)
Sodium: 137 mmol/L (ref 134–144)
Total Protein: 6.9 g/dL (ref 6.0–8.5)

## 2020-08-25 LAB — HEMOGLOBIN A1C
Est. average glucose Bld gHb Est-mCnc: 105 mg/dL
Hgb A1c MFr Bld: 5.3 % (ref 4.8–5.6)

## 2020-08-25 LAB — HEPATITIS C ANTIBODY: Hep C Virus Ab: <0.1 s/co ratio (ref 0.0–0.9)

## 2020-08-25 NOTE — Assessment & Plan Note (Signed)
Stable on Venlafaxine Support offered She will continue to see her therapist

## 2020-08-25 NOTE — Telephone Encounter (Signed)
Aurther Loft are we able to add labs for vitamin D and B12?  You can attach to diagnosis code general medical examination with abnormal findings

## 2020-08-25 NOTE — Patient Instructions (Signed)
Health Maintenance, Female Adopting a healthy lifestyle and getting preventive care are important in promoting health and wellness. Ask your health care provider about:  The right schedule for you to have regular tests and exams.  Things you can do on your own to prevent diseases and keep yourself healthy. What should I know about diet, weight, and exercise? Eat a healthy diet   Eat a diet that includes plenty of vegetables, fruits, low-fat dairy products, and lean protein.  Do not eat a lot of foods that are high in solid fats, added sugars, or sodium. Maintain a healthy weight Body mass index (BMI) is used to identify weight problems. It estimates body fat based on height and weight. Your health care provider can help determine your BMI and help you achieve or maintain a healthy weight. Get regular exercise Get regular exercise. This is one of the most important things you can do for your health. Most adults should:  Exercise for at least 150 minutes each week. The exercise should increase your heart rate and make you sweat (moderate-intensity exercise).  Do strengthening exercises at least twice a week. This is in addition to the moderate-intensity exercise.  Spend less time sitting. Even light physical activity can be beneficial. Watch cholesterol and blood lipids Have your blood tested for lipids and cholesterol at 44 years of age, then have this test every 5 years. Have your cholesterol levels checked more often if:  Your lipid or cholesterol levels are high.  You are older than 44 years of age.  You are at high risk for heart disease. What should I know about cancer screening? Depending on your health history and family history, you may need to have cancer screening at various ages. This may include screening for:  Breast cancer.  Cervical cancer.  Colorectal cancer.  Skin cancer.  Lung cancer. What should I know about heart disease, diabetes, and high blood  pressure? Blood pressure and heart disease  High blood pressure causes heart disease and increases the risk of stroke. This is more likely to develop in people who have high blood pressure readings, are of African descent, or are overweight.  Have your blood pressure checked: ? Every 3-5 years if you are 18-39 years of age. ? Every year if you are 40 years old or older. Diabetes Have regular diabetes screenings. This checks your fasting blood sugar level. Have the screening done:  Once every three years after age 40 if you are at a normal weight and have a low risk for diabetes.  More often and at a younger age if you are overweight or have a high risk for diabetes. What should I know about preventing infection? Hepatitis B If you have a higher risk for hepatitis B, you should be screened for this virus. Talk with your health care provider to find out if you are at risk for hepatitis B infection. Hepatitis C Testing is recommended for:  Everyone born from 1945 through 1965.  Anyone with known risk factors for hepatitis C. Sexually transmitted infections (STIs)  Get screened for STIs, including gonorrhea and chlamydia, if: ? You are sexually active and are younger than 44 years of age. ? You are older than 44 years of age and your health care provider tells you that you are at risk for this type of infection. ? Your sexual activity has changed since you were last screened, and you are at increased risk for chlamydia or gonorrhea. Ask your health care provider if   you are at risk.  Ask your health care provider about whether you are at high risk for HIV. Your health care provider may recommend a prescription medicine to help prevent HIV infection. If you choose to take medicine to prevent HIV, you should first get tested for HIV. You should then be tested every 3 months for as Uy as you are taking the medicine. Pregnancy  If you are about to stop having your period (premenopausal) and  you may become pregnant, seek counseling before you get pregnant.  Take 400 to 800 micrograms (mcg) of folic acid every day if you become pregnant.  Ask for birth control (contraception) if you want to prevent pregnancy. Osteoporosis and menopause Osteoporosis is a disease in which the bones lose minerals and strength with aging. This can result in bone fractures. If you are 65 years old or older, or if you are at risk for osteoporosis and fractures, ask your health care provider if you should:  Be screened for bone loss.  Take a calcium or vitamin D supplement to lower your risk of fractures.  Be given hormone replacement therapy (HRT) to treat symptoms of menopause. Follow these instructions at home: Lifestyle  Do not use any products that contain nicotine or tobacco, such as cigarettes, e-cigarettes, and chewing tobacco. If you need help quitting, ask your health care provider.  Do not use street drugs.  Do not share needles.  Ask your health care provider for help if you need support or information about quitting drugs. Alcohol use  Do not drink alcohol if: ? Your health care provider tells you not to drink. ? You are pregnant, may be pregnant, or are planning to become pregnant.  If you drink alcohol: ? Limit how much you use to 0-1 drink a day. ? Limit intake if you are breastfeeding.  Be aware of how much alcohol is in your drink. In the [place] one drink equals one 12 oz bottle of beer (355 mL), one 5 oz glass of wine (148 mL), or one 1 oz glass of hard liquor (44 mL). General instructions  Schedule regular health, dental, and eye exams.  Stay current with your vaccines.  Tell your health care provider if: ? You often feel depressed. ? You have ever been abused or do not feel safe at home. Summary  Adopting a healthy lifestyle and getting preventive care are important in promoting health and wellness.  Follow your health care provider's instructions about healthy  diet, exercising, and getting tested or screened for diseases.  Follow your health care provider's instructions on monitoring your cholesterol and blood pressure. This information is not intended to replace advice given to you by your health care provider. Make sure you discuss any questions you have with your health care provider. Document Revised: 08/08/2018 Document Reviewed: 08/08/2018 Elsevier Patient Education  2020 [employer]  

## 2020-08-25 NOTE — Assessment & Plan Note (Signed)
Elevated today Continue Losartan and Amlodipine for now CMET today Reinforced DASH diet and exercise for weight loss Will monitor

## 2020-08-25 NOTE — Assessment & Plan Note (Signed)
Continue Acyclovir as needed

## 2020-08-25 NOTE — Assessment & Plan Note (Signed)
CMET and Lipid profile today Encouraged her to consume a low fat diet Continue Rosuvastatin for now 

## 2020-09-01 ENCOUNTER — Encounter: Payer: Self-pay | Admitting: Internal Medicine

## 2020-09-03 ENCOUNTER — Other Ambulatory Visit: Payer: Self-pay | Admitting: Internal Medicine

## 2020-09-10 LAB — SPECIMEN STATUS REPORT

## 2020-09-10 LAB — VITAMIN D 25 HYDROXY (VIT D DEFICIENCY, FRACTURES): Vit D, 25-Hydroxy: 25.6 ng/mL — ABNORMAL LOW (ref 30.0–100.0)

## 2020-09-10 LAB — VITAMIN B12: Vitamin B-12: 471 pg/mL (ref 232–1245)

## 2020-09-16 ENCOUNTER — Other Ambulatory Visit: Payer: Self-pay | Admitting: Internal Medicine

## 2020-09-16 DIAGNOSIS — Z01419 Encounter for gynecological examination (general) (routine) without abnormal findings: Secondary | ICD-10-CM

## 2020-10-15 ENCOUNTER — Encounter: Payer: Self-pay | Admitting: Internal Medicine

## 2020-10-16 ENCOUNTER — Other Ambulatory Visit: Payer: Self-pay

## 2020-10-16 ENCOUNTER — Telehealth: Payer: Managed Care, Other (non HMO) | Admitting: Internal Medicine

## 2020-10-27 ENCOUNTER — Other Ambulatory Visit: Payer: Self-pay | Admitting: Internal Medicine

## 2020-10-29 ENCOUNTER — Other Ambulatory Visit: Payer: Self-pay

## 2020-10-29 ENCOUNTER — Ambulatory Visit: Payer: Managed Care, Other (non HMO) | Admitting: Internal Medicine

## 2020-10-29 VITALS — BP 138/92 | HR 81

## 2020-10-29 DIAGNOSIS — J302 Other seasonal allergic rhinitis: Secondary | ICD-10-CM | POA: Diagnosis not present

## 2020-10-29 DIAGNOSIS — F5101 Primary insomnia: Secondary | ICD-10-CM

## 2020-10-29 NOTE — Progress Notes (Signed)
Subjective:    Patient ID: Margaret Carlson, female    DOB: 1976/01/03, 45 y.o.   MRN: [id number]  HPI  Pt presents to the clinic today with c/o insomnia. She noticed this when she started on Venlafaxine. She is able to fall asleep but has been unable to stay asleep. She is not currently taking anything for sleep. There is no sleep study on file.  She also reports some issues with seasonal allergies. She has been taking Claritin/Allegra OTC with some relief of symptoms. She denies fever, chills, body aches, loss of taste/smell.   Review of Systems      Past Medical History:  Diagnosis Date  . Hypertension     Current Outpatient Medications  Medication Sig Dispense Refill  . acyclovir (ZOVIRAX) 400 MG tablet Take 1 tablet (400 mg total) by mouth 3 (three) times daily. For 5 days during an outbreak 30 tablet 1  . amLODipine (NORVASC) 10 MG tablet TAKE 1 TABLET BY MOUTH  DAILY 90 tablet 2  . Calcium-Vitamin D-Vitamin K (CALCIUM + D + K PO) Take 1 capsule by mouth daily.    . cyanocobalamin 2000 MCG tablet Take 2,000 mcg by mouth daily.    Marland Kitchen losartan (COZAAR) 25 MG tablet TAKE 1 TABLET BY MOUTH  DAILY 90 tablet 0  . meloxicam (MOBIC) 15 MG tablet Take 1 tablet by mouth once daily 90 tablet 0  . Multiple Vitamin (MULTIVITAMIN) tablet Take 1 tablet by mouth daily.    . Norethindrone Acetate-Ethinyl Estradiol (JUNEL 1.5/30) 1.5-30 MG-MCG tablet TAKE 1 TABLET BY MOUTH  DAILY FOR 3 WEEKS THEN OFF  FOR 1 WEEK 63 tablet 0  . rosuvastatin (CRESTOR) 10 MG tablet TAKE 1 TABLET BY MOUTH  DAILY 90 tablet 2  . venlafaxine XR (EFFEXOR XR) 37.5 MG 24 hr capsule Take 1 capsule (37.5 mg total) by mouth daily with breakfast. 90 capsule 3   No current facility-administered medications for this visit.    Allergies  Allergen Reactions  . Ranitidine Swelling    Face and lip swelling    Family History  Problem Relation Age of Onset  . Arthritis Maternal Grandmother   . Hypertension Maternal  Grandmother   . Breast cancer Maternal Grandmother 77    Social History   Socioeconomic History  . Marital status: Single    Spouse name: Not on file  . Number of children: Not on file  . Years of education: Not on file  . Highest education level: Not on file  Occupational History  . Not on file  Tobacco Use  . Smoking status: Never Smoker  . Smokeless tobacco: Never Used  Vaping Use  . Vaping Use: Never used  Substance and Sexual Activity  . Alcohol use: No    Alcohol/week: 0.0 standard drinks  . Drug use: No  . Sexual activity: Yes    Partners: Male    Birth control/protection: Pill  Other Topics Concern  . Not on file  Social History Narrative  . Not on file   Social Determinants of Health   Financial Resource Strain: Not on file  Food Insecurity: Not on file  Transportation Needs: Not on file  Physical Activity: Not on file  Stress: Not on file  Social Connections: Not on file  Intimate Partner Violence: Not on file     Constitutional: Denies fever, malaise, fatigue, headache or abrupt weight changes.  Respiratory: Denies difficulty breathing, shortness of breath, cough or sputum production.   Cardiovascular: Denies chest  pain, chest tightness, palpitations or swelling in the hands or feet.  Neurological: Pt reports insomnia. Denies dizziness, difficulty with memory, difficulty with speech or problems with balance and coordination.    No other specific complaints in a complete review of systems (except as listed in HPI above).  Objective:   Physical Exam   BP (!) 138/92   Pulse 81   Wt Readings from Last 3 Encounters:  08/24/20 212 lb 1.9 oz (96.2 kg)  01/14/20 265 lb (120.2 kg)  01/02/20 257 lb (116.6 kg)    General: Appears her stated age, obese, in NAD. Cardiovascular: Normal rate. Pulmonary/Chest: Normal effort.  Neurological: Alert and oriented. Psych: Mood and affect normal.   BMET    Component Value Date/Time   NA 137 08/24/2020 1626    NA 137 07/03/2013 2132   K 3.7 08/24/2020 1626   K 3.6 07/03/2013 2132   CL 101 08/24/2020 1626   CL 103 07/03/2013 2132   CO2 22 08/24/2020 1626   CO2 29 07/03/2013 2132   GLUCOSE 78 08/24/2020 1626   GLUCOSE 118 (H) 06/18/2018 1756   GLUCOSE 90 07/03/2013 2132   BUN 13 08/24/2020 1626   BUN 9 07/03/2013 2132   CREATININE 0.97 08/24/2020 1626   CREATININE 0.85 07/03/2013 2132   CALCIUM 8.3 (L) 08/24/2020 1626   CALCIUM 9.0 07/03/2013 2132   GFRNONAA 71 08/24/2020 1626   GFRNONAA >60 07/03/2013 2132   GFRAA 82 08/24/2020 1626   GFRAA >60 07/03/2013 2132    Lipid Panel     Component Value Date/Time   CHOL 168 08/24/2020 1626   TRIG 45 08/24/2020 1626   HDL 63 08/24/2020 1626   CHOLHDL 2.7 08/24/2020 1626   LDLCALC 96 08/24/2020 1626    CBC    Component Value Date/Time   WBC 5.5 08/24/2020 1626   WBC 7.7 06/18/2018 1756   RBC 5.02 08/24/2020 1626   RBC 6.05 (H) 06/18/2018 1756   HGB 12.3 08/24/2020 1626   HCT 36.8 08/24/2020 1626   PLT 228 08/24/2020 1626   MCV 73 (L) 08/24/2020 1626   MCV 73 (L) 07/03/2013 2132   MCH 24.5 (L) 08/24/2020 1626   MCH 23.0 (L) 06/18/2018 1756   MCHC 33.4 08/24/2020 1626   MCHC 33.1 06/18/2018 1756   RDW 19.6 (H) 08/24/2020 1626   RDW 19.1 (H) 07/03/2013 2132   LYMPHSABS 2.5 03/17/2015 1618   EOSABS 0.1 03/17/2015 1618   BASOSABS 0.0 03/17/2015 1618    Hgb A1C Lab Results  Component Value Date   HGBA1C 5.3 08/24/2020           Assessment & Plan:   Insomnia:   Secondary to medication Start taking the Venlafaxine in the morning instead of at night  Seasonal Allergies:  Can consider taking Benadryl at night to aid with sleep Ok to take an Allegra or Claritin during the day as well if needed  Return precautions discussed  Webb Silversmith, NP This visit occurred during the SARS-CoV-2 public health emergency.  Safety protocols were in place, including screening questions prior to the visit, additional usage of  staff PPE, and extensive cleaning of exam room while observing appropriate contact time as indicated for disinfecting solutions.

## 2020-11-08 ENCOUNTER — Encounter: Payer: Self-pay | Admitting: Internal Medicine

## 2020-11-08 NOTE — Patient Instructions (Signed)

## 2020-11-09 ENCOUNTER — Other Ambulatory Visit: Payer: Self-pay | Admitting: Internal Medicine

## 2020-11-12 ENCOUNTER — Encounter: Payer: Self-pay | Admitting: Internal Medicine

## 2020-11-12 DIAGNOSIS — Z1211 Encounter for screening for malignant neoplasm of colon: Secondary | ICD-10-CM

## 2020-12-03 NOTE — Telephone Encounter (Signed)
Per chart patient has been contacted now and appointment is scheduled. Nothing further is needed at this time.

## 2020-12-08 ENCOUNTER — Ambulatory Visit (AMBULATORY_SURGERY_CENTER): Payer: Managed Care, Other (non HMO)

## 2020-12-08 ENCOUNTER — Other Ambulatory Visit: Payer: Self-pay

## 2020-12-08 VITALS — Ht 65.0 in | Wt 285.6 lb

## 2020-12-08 DIAGNOSIS — Z1211 Encounter for screening for malignant neoplasm of colon: Secondary | ICD-10-CM

## 2020-12-08 MED ORDER — NA SULFATE-K SULFATE-MG SULF 17.5-3.13-1.6 GM/177ML PO SOLN: 1.0000 | Freq: Once | ORAL | 0 refills | Status: AC

## 2020-12-08 NOTE — Progress Notes (Signed)
No egg or soy allergy known to patient  No issues with past sedation with any surgeries or procedures Patient denies ever being told they had issues or difficulty with intubation  No FH of Malignant Hyperthermia No diet pills per patient No home 02 use per patient  No blood thinners per patient  Pt denies issues with constipation  No A fib or A flutter  EMMI video to pt or via [place] [facility] guidelines implemented in [place] [facility] today with Pt and RN  Pt is fully vaccinated  for [employer] given to pt in PV today , Code to Pharmacy and  NO PA's for preps discussed with pt In PV today  Discussed with pt there will be an out-of-pocket cost for prep and that varies from $0 to 70 dollars   Due to the COVID-19 pandemic we are asking patients to follow certain guidelines.  Pt aware of COVID protocols and [facility] guidelines

## 2020-12-09 ENCOUNTER — Other Ambulatory Visit: Payer: Self-pay | Admitting: Internal Medicine

## 2020-12-09 MED ORDER — ACYCLOVIR 400 MG PO TABS: 400.0000 mg | ORAL_TABLET | Freq: Three times a day (TID) | ORAL | 0 refills | Status: DC | PRN

## 2020-12-09 NOTE — Addendum Note (Signed)
Addended by: Lurlean Nanny on: 12/09/2020 01:30 PM   Modules accepted: Orders

## 2020-12-17 ENCOUNTER — Encounter: Payer: Self-pay | Admitting: Gastroenterology

## 2020-12-18 ENCOUNTER — Telehealth: Payer: Self-pay | Admitting: *Deleted

## 2020-12-18 MED ORDER — MELOXICAM 15 MG PO TABS: 1.0000 | ORAL_TABLET | Freq: Every day | ORAL | 3 refills | Status: DC

## 2020-12-18 NOTE — Telephone Encounter (Signed)
Refilled meloxicam

## 2020-12-22 ENCOUNTER — Ambulatory Visit (AMBULATORY_SURGERY_CENTER): Payer: Managed Care, Other (non HMO) | Admitting: Gastroenterology

## 2020-12-22 ENCOUNTER — Other Ambulatory Visit: Payer: Self-pay

## 2020-12-22 ENCOUNTER — Encounter: Payer: Self-pay | Admitting: Gastroenterology

## 2020-12-22 VITALS — BP 153/99 | HR 79 | Temp 98.6°F | Resp 13 | Ht 65.0 in | Wt 285.0 lb

## 2020-12-22 DIAGNOSIS — Z1211 Encounter for screening for malignant neoplasm of colon: Secondary | ICD-10-CM | POA: Diagnosis not present

## 2020-12-22 MED ORDER — SODIUM CHLORIDE 0.9 % IV SOLN: 500.0000 mL | Freq: Once | INTRAVENOUS | Status: DC

## 2020-12-22 NOTE — Op Note (Signed)
[place] Patient Name: Margaret Carlson Procedure Date: 12/22/2020 1:33 PM MRN: [id number] Endoscopist: Mallie Mussel L. Loletha Carrow , MD Age: 45 Referring MD:  Date of Birth: [birth year] Gender: Female Account #: [id number] Procedure:                Colonoscopy Indications:              Screening for colorectal malignant neoplasm, This                            is the patient's first colonoscopy Medicines:                Monitored Anesthesia Care Procedure:                Pre-Anesthesia Assessment:                           - Prior to the procedure, a History and Physical                            was performed, and patient medications and                            allergies were reviewed. The patient's tolerance of                            previous anesthesia was also reviewed. The risks                            and benefits of the procedure and the sedation                            options and risks were discussed with the patient.                            All questions were answered, and informed consent                            was obtained. Prior Anticoagulants: The patient has                            taken no previous anticoagulant or antiplatelet                            agents. ASA Grade Assessment: III - A patient with                            severe systemic disease. After reviewing the risks                            and benefits, the patient was deemed in                            satisfactory condition to undergo the procedure.  After obtaining informed consent, the colonoscope                            was passed under direct vision. Throughout the                            procedure, the patient's blood pressure, pulse, and                            oxygen saturations were monitored continuously. The                            Olympus CF-HQ190L (Serial# [id number]) Colonoscope was                            introduced through the  anus and advanced to the the                            cecum, identified by appendiceal orifice and                            ileocecal valve. The colonoscopy was performed                            without difficulty. The patient tolerated the                            procedure well. The quality of the bowel                            preparation was good. The ileocecal valve,                            appendiceal orifice, and rectum were photographed. Scope In: 1:43:35 PM Scope Out: 1:56:32 PM Scope Withdrawal Time: 0 hours 10 minutes 48 seconds  Total Procedure Duration: 0 hours 12 minutes 57 seconds  Findings:                 The perianal and digital rectal examinations were                            normal.                           The entire examined colon appeared normal on direct                            and retroflexion views. Complications:            No immediate complications. Estimated Blood Loss:     Estimated blood loss: none. Impression:               - The entire examined colon is normal on direct and                            retroflexion views.                           -  No specimens collected. Recommendation:           - Patient has a contact number available for                            emergencies. The signs and symptoms of potential                            delayed complications were discussed with the                            patient. Return to normal activities tomorrow.                            Written discharge instructions were provided to the                            patient.                           - Resume previous diet.                           - Continue present medications.                           - Repeat colonoscopy in 10 years for screening                            purposes. Naarah Borgerding L. Loletha Carrow, MD 12/22/2020 1:59:24 PM This report has been signed electronically.

## 2020-12-22 NOTE — Progress Notes (Signed)
Pt's states no medical or surgical changes since previsit or office visit. 

## 2020-12-22 NOTE — Progress Notes (Signed)
Report to PACU, RN, vss, BBS= Clear.  

## 2020-12-22 NOTE — Patient Instructions (Addendum)
?You may resume your current medications today. ?Repeat colonoscopy in 10 years for screening purposes. ?Please call if any questions or concerns. ?  ? ? ? ?YOU HAD AN ENDOSCOPIC PROCEDURE TODAY AT THE [facility]:   Refer to the procedure report that was given to you for any specific questions about what was found during the examination.  If the procedure report does not answer your questions, please call your gastroenterologist to clarify.  If you requested that your care partner not be given the details of your procedure findings, then the procedure report has been included in a sealed envelope for you to review at your convenience later. ? ?YOU SHOULD EXPECT: Some feelings of bloating in the abdomen. Passage of more gas than usual.  Walking can help get rid of the air that was put into your GI tract during the procedure and reduce the bloating. If you had a lower endoscopy (such as a colonoscopy or flexible sigmoidoscopy) you may notice spotting of blood in your stool or on the toilet paper. If you underwent a bowel prep for your procedure, you may not have a normal bowel movement for a few days. ? ?Please Note:  You might notice some irritation and congestion in your nose or some drainage.  This is from the oxygen used during your procedure.  There is no need for concern and it should clear up in a day or so. ? ?SYMPTOMS TO REPORT IMMEDIATELY: ? ?Following lower endoscopy (colonoscopy or flexible sigmoidoscopy): ? Excessive amounts of blood in the stool ? Significant tenderness or worsening of abdominal pains ? Swelling of the abdomen that is new, acute ? Fever of 100?F or higher ? ? ?For urgent or emergent issues, a gastroenterologist can be reached at any hour by calling [phone]. ?Do not use MyChart messaging for urgent concerns.  ? ? ?DIET:  We do recommend a small meal at first, but then you may proceed to your regular diet.  Drink plenty of fluids but you should avoid alcoholic  beverages for 24 hours. ? ?ACTIVITY:  You should plan to take it easy for the rest of today and you should NOT DRIVE or use heavy machinery until tomorrow (because of the sedation medicines used during the test).   ? ?FOLLOW UP: ?Our staff will call the number listed on your records 48-72 hours following your procedure to check on you and address any questions or concerns that you may have regarding the information given to you following your procedure. If we do not reach you, we will leave a message.  We will attempt to reach you two times.  During this call, we will ask if you have developed any symptoms of COVID 19. If you develop any symptoms (ie: fever, flu-like symptoms, shortness of breath, cough etc.) before then, please call [phone].  If you test positive for Covid 19 in the 2 weeks post procedure, please call and report this information to us.   ? ?If any biopsies were taken you will be contacted by phone or by letter within the next 1-3 weeks.  Please call us at [phone] if you have not heard about the biopsies in 3 weeks.  ? ? ?SIGNATURES/CONFIDENTIALITY: ?You and/or your care partner have signed paperwork which will be entered into your electronic medical record.  These signatures attest to the fact that that the information above on your After Visit Summary has been reviewed and is understood.  Full responsibility of the confidentiality of this   this discharge information lies with you and/or your care-partner.

## 2020-12-22 NOTE — Progress Notes (Signed)
C.W. vital signs. 

## 2020-12-22 NOTE — Progress Notes (Signed)
Pt did not take her blood pressure medication this am she reported.  Her b/p was elevated.  She said normally her MD uses a larger cuff.  I changed her cuff to larger size and her pressure still remained high.  She was apvised to take her b/p medication when she gets home.  She said she would and that she only takes it once a day.  Pt said she felt fine.    No other problems noted in the recovery room. maw

## 2020-12-24 ENCOUNTER — Telehealth: Payer: Self-pay

## 2020-12-24 NOTE — Telephone Encounter (Signed)
No answer at [phone] at 07:30.  I was unable to leave a message that we tried to call pt for follow up from procedure.  maw

## 2021-01-16 ENCOUNTER — Other Ambulatory Visit: Payer: Self-pay | Admitting: Internal Medicine

## 2021-01-27 NOTE — Telephone Encounter (Signed)
Refill request Junel Last refill 10/28/20 #63 Last office visit 10/29/20 No upcoming appointment scheduled with a PCP

## 2021-03-04 ENCOUNTER — Encounter: Payer: Self-pay | Admitting: Internal Medicine

## 2021-03-04 ENCOUNTER — Ambulatory Visit: Payer: Managed Care, Other (non HMO) | Admitting: Internal Medicine

## 2021-03-04 ENCOUNTER — Other Ambulatory Visit: Payer: Self-pay

## 2021-03-04 VITALS — BP 135/75 | HR 85 | Temp 98.0°F | Resp 18 | Ht 65.0 in | Wt 298.6 lb

## 2021-03-04 DIAGNOSIS — R198 Other specified symptoms and signs involving the digestive system and abdomen: Secondary | ICD-10-CM

## 2021-03-04 DIAGNOSIS — R0989 Other specified symptoms and signs involving the circulatory and respiratory systems: Secondary | ICD-10-CM

## 2021-03-04 MED ORDER — OMEPRAZOLE 20 MG PO CPDR: 20.0000 mg | DELAYED_RELEASE_CAPSULE | Freq: Every day | ORAL | 0 refills | Status: DC

## 2021-03-04 NOTE — Patient Instructions (Signed)
[facility] head [facility] neck surgery (6th ed., vol. 1, pp. [phone]). [place], PA: [place].">  Globus Pharyngeus Globus pharyngeus is a condition that makes a person feel like there is a lump in the throat. It may also feel like there is something stuck in the front of the throat. It is not painful, and it does not make it harder to swallow food or liquid. Globus pharyngeus does not cause changes that a health care provider can see during a physical exam. This problem usually comes and goes away, but may last a [place] time when it occurs. The problem usually does not requiretreatment. Globus pharyngeus may be caused by: Stomach juices that flow back up into the throat (gastroesophageal reflux). Irritation of nerves that control swallowing (neuralgia). Anxiety, or other mood states such as grief or depression. Irritation from smoking cigarettes, drinking alcohol, or drinking beverages with caffeine. Your health care provider will do exams and tests to find the cause of yourcondition and to rule out other serious conditions. Follow these instructions at home:     Watch your condition for any changes. Take these steps to help with your condition: Take over-the-counter and prescription medicines only as told by your health care provider. Follow instructions from your health care provider about any eating or drinking restrictions. This may include reducing alcohol and caffeine intake. Avoid stress and practice relaxation exercises as told by your health care provider. Do not use any products that contain nicotine or tobacco, such as cigarettes, e-cigarettes, and chewing tobacco. If you need help quitting, ask your health care provider. It is up to you to get the results of any tests that were done. Ask your health care provider, or the department that is doing the tests, when your results will be ready. Keep all follow-up visits as told by your health care provider. This is  important. Contact a health care provider if: Your symptoms get worse. You have throat pain. You have trouble swallowing. Food or liquid comes back up into your mouth. You lose weight without trying. Get help right away if: You develop swelling in your throat. Summary Globus pharyngeus is a condition that makes you feel like you have a lump in your throat. This condition does not cause pain, and it does not make it harder to swallow food or liquid. This condition usually goes away without treatment. Follow instructions about eating and drinking. You may be asked to avoid alcohol and beverages that have caffeine. Watch your condition for any changes. Let your health care provider know about them. This information is not intended to replace advice given to you by your health care provider. Make sure you discuss any questions you have with your healthcare provider. Document Revised: 07/12/2019 Document Reviewed: 07/12/2019 Elsevier Patient Education  2022 [employer].

## 2021-03-04 NOTE — Assessment & Plan Note (Signed)
Encouraged diet and exercise for weight loss S/p sleeve gastrectomy

## 2021-03-04 NOTE — Progress Notes (Signed)
Subjective:    Patient ID: Margaret Carlson, female    DOB: 12-23-1975, 45 y.o.   MRN: [id number]  HPI  Patient presents the clinic today with complaint choking sensation when she eats. She reports this started 4 days ago. She denies nausea, vomiting, reflux, constipation, diarrhea or decreased appetite. She reports she has not actually choked. She has not taken anything OTC for this.   Review of Systems     Past Medical History:  Diagnosis Date   Allergy    Anxiety    Hyperlipidemia    Hypertension     Current Outpatient Medications  Medication Sig Dispense Refill   acyclovir (ZOVIRAX) 400 MG tablet Take 1 tablet (400 mg total) by mouth 3 (three) times daily as needed (Take for 5 days for an outbreak). 90 tablet 0   acyclovir (ZOVIRAX) 400 MG tablet Take 1 tablet (400 mg total) by mouth 3 (three) times daily as needed. For 5 days during an outbreak 90 tablet 0   amLODipine (NORVASC) 10 MG tablet TAKE 1 TABLET BY MOUTH  DAILY 90 tablet 2   Calcium-Vitamin D-Vitamin K (CALCIUM + D + K PO) Take 1 capsule by mouth daily.     cyanocobalamin 2000 MCG tablet Take 2,000 mcg by mouth daily.     losartan (COZAAR) 25 MG tablet TAKE 1 TABLET BY MOUTH  DAILY 90 tablet 2   meloxicam (MOBIC) 15 MG tablet Take 1 tablet (15 mg total) by mouth daily. 90 tablet 3   Multiple Vitamin (MULTIVITAMIN) tablet Take 1 tablet by mouth daily.     Norethindrone Acetate-Ethinyl Estradiol (JUNEL 1.5/30) 1.5-30 MG-MCG tablet TAKE 1 TABLET BY MOUTH  DAILY FOR 3 WEEKS THEN OFF  FOR 1 WEEK 63 tablet 3   rosuvastatin (CRESTOR) 10 MG tablet TAKE 1 TABLET BY MOUTH  DAILY 90 tablet 2   venlafaxine XR (EFFEXOR XR) 37.5 MG 24 hr capsule Take 1 capsule (37.5 mg total) by mouth daily with breakfast. 90 capsule 3   No current facility-administered medications for this visit.    Allergies  Allergen Reactions   Ranitidine Swelling    Face and lip swelling    Family History  Problem Relation Age of Onset   Arthritis  Maternal Grandmother    Hypertension Maternal Grandmother    Breast cancer Maternal Grandmother 77   Colon cancer Neg Hx    Colon polyps Neg Hx    Rectal cancer Neg Hx    Stomach cancer Neg Hx    Esophageal cancer Neg Hx     Social History   Socioeconomic History   Marital status: Single    Spouse name: Not on file   Number of children: Not on file   Years of education: Not on file   Highest education level: Not on file  Occupational History   Not on file  Tobacco Use   Smoking status: Never   Smokeless tobacco: Never  Vaping Use   Vaping Use: Never used  Substance and Sexual Activity   Alcohol use: Yes    Alcohol/week: 0.0 standard drinks    Comment: occasional   Drug use: No   Sexual activity: Yes    Partners: Male    Birth control/protection: Pill  Other Topics Concern   Not on file  Social History Narrative   Not on file   Social Determinants of Health   Financial Resource Strain: Not on file  Food Insecurity: Not on file  Transportation Needs: Not on file  Physical Activity: Not on file  Stress: Not on file  Social Connections: Not on file  Intimate Partner Violence: Not on file     Constitutional: Denies fever, malaise, fatigue, headache or abrupt weight changes.  HEENT: Denies eye pain, eye redness, ear pain, ringing in the ears, wax buildup, runny nose, nasal congestion, bloody nose, or sore throat. Respiratory: Denies difficulty breathing, shortness of breath, cough or sputum production.   Cardiovascular: Denies chest pain, chest tightness, palpitations or swelling in the hands or feet.  Gastrointestinal: Pt reports globus sensation. Denies abdominal pain, bloating, constipation, diarrhea or blood in the stool.   No other specific complaints in a complete review of systems (except as listed in HPI above).  Objective:   Physical Exam  BP 135/75 (BP Location: Right Arm, Patient Position: Sitting, Cuff Size: Large)   Pulse 85   Temp 98 F (36.7 C)  (Temporal)   Resp 18   Ht 5\' 5"  (1.651 m)   Wt 298 lb 9.6 oz (135.4 kg)   SpO2 100%   BMI 49.69 kg/m   Wt Readings from Last 3 Encounters:  12/22/20 285 lb (129.3 kg)  12/08/20 285 lb 9.6 oz (129.5 kg)  08/24/20 212 lb 1.9 oz (96.2 kg)    General: Appears her stated age, obese, in NAD. Skin: Warm, dry and intact.  Neck:  Neck supple, trachea midline. No masses, lumps present. Thyromegaly noted.  Cardiovascular: Normal rate and rhythm. S1,S2 noted.  No murmur, rubs or gallops noted. Pulmonary/Chest: Normal effort and positive vesicular breath sounds. No respiratory distress. No wheezes, rales or ronchi noted.  Abdomen: Soft and nontender. Normal bowel sounds.  Neurological: Alert and oriented.   BMET    Component Value Date/Time   NA 137 08/24/2020 1626   NA 137 07/03/2013 2132   K 3.7 08/24/2020 1626   K 3.6 07/03/2013 2132   CL 101 08/24/2020 1626   CL 103 07/03/2013 2132   CO2 22 08/24/2020 1626   CO2 29 07/03/2013 2132   GLUCOSE 78 08/24/2020 1626   GLUCOSE 118 (H) 06/18/2018 1756   GLUCOSE 90 07/03/2013 2132   BUN 13 08/24/2020 1626   BUN 9 07/03/2013 2132   CREATININE 0.97 08/24/2020 1626   CREATININE 0.85 07/03/2013 2132   CALCIUM 8.3 (L) 08/24/2020 1626   CALCIUM 9.0 07/03/2013 2132   GFRNONAA 71 08/24/2020 1626   GFRNONAA >60 07/03/2013 2132   GFRAA 82 08/24/2020 1626   GFRAA >60 07/03/2013 2132    Lipid Panel     Component Value Date/Time   CHOL 168 08/24/2020 1626   TRIG 45 08/24/2020 1626   HDL 63 08/24/2020 1626   CHOLHDL 2.7 08/24/2020 1626   LDLCALC 96 08/24/2020 1626    CBC    Component Value Date/Time   WBC 5.5 08/24/2020 1626   WBC 7.7 06/18/2018 1756   RBC 5.02 08/24/2020 1626   RBC 6.05 (H) 06/18/2018 1756   HGB 12.3 08/24/2020 1626   HCT 36.8 08/24/2020 1626   PLT 228 08/24/2020 1626   MCV 73 (L) 08/24/2020 1626   MCV 73 (L) 07/03/2013 2132   MCH 24.5 (L) 08/24/2020 1626   MCH 23.0 (L) 06/18/2018 1756   MCHC 33.4 08/24/2020  1626   MCHC 33.1 06/18/2018 1756   RDW 19.6 (H) 08/24/2020 1626   RDW 19.1 (H) 07/03/2013 2132   LYMPHSABS 2.5 03/17/2015 1618   EOSABS 0.1 03/17/2015 1618   BASOSABS 0.0 03/17/2015 1618    Hgb A1C Lab Results  Component Value Date   HGBA1C 5.3 08/24/2020            Assessment & Plan:    Globus Sensation:  Will trial Omeprazole 20 mg daily x 2 weeks  Notify me if symptoms persist or worsen  Webb Silversmith, NP This visit occurred during the SARS-CoV-2 public health emergency.  Safety protocols were in place, including screening questions prior to the visit, additional usage of staff PPE, and extensive cleaning of exam room while observing appropriate contact time as indicated for disinfecting solutions.

## 2021-03-23 IMAGING — US US EXTREM  UP VENOUS*R*
1 series · 13 of 24 positions shown · non-contrast
Comparison: None.

CLINICAL DATA: 43-year-old female with right upper extremity
swelling and pain for the past 1 month



[Series 1: us extrem up venous*right* · 0.08mm/px · 13 of 31 slices shown]
[im 1/31]
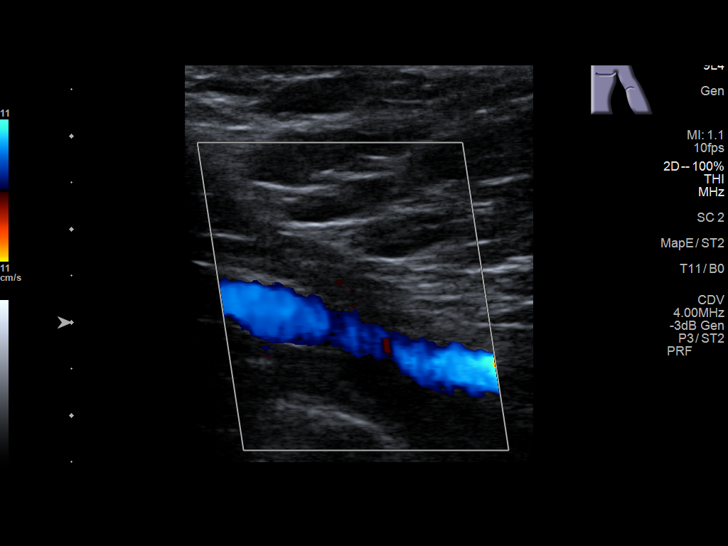
[im 3/31]
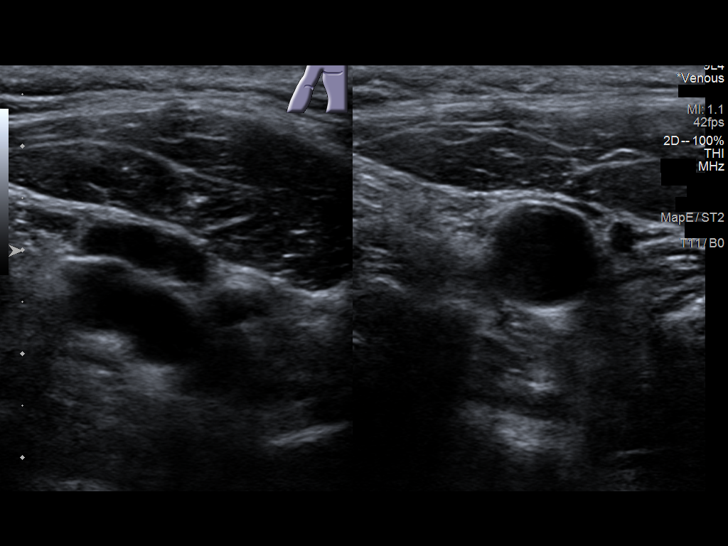
[im 6/31]
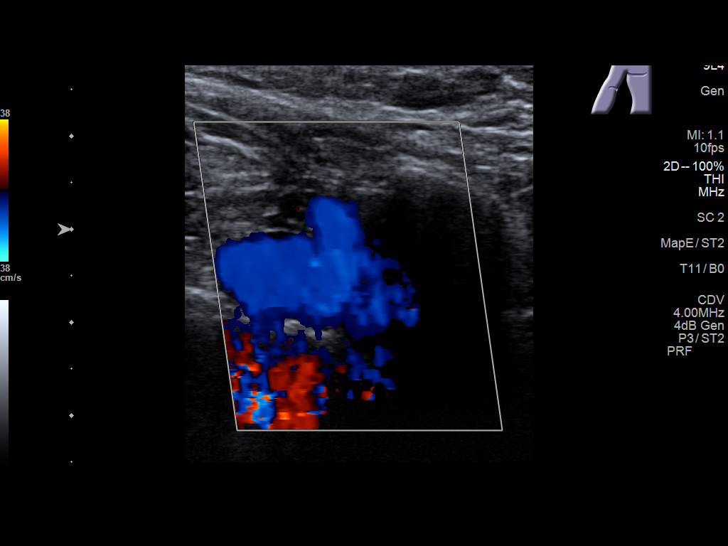
[im 8/31]
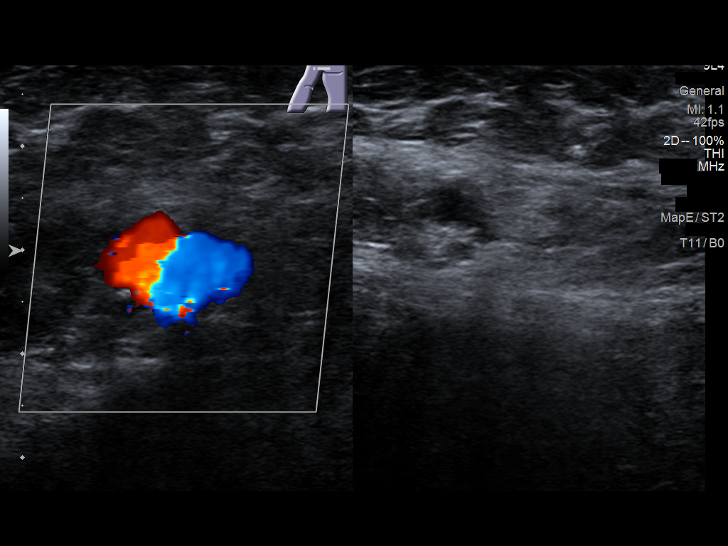
[im 11/31]
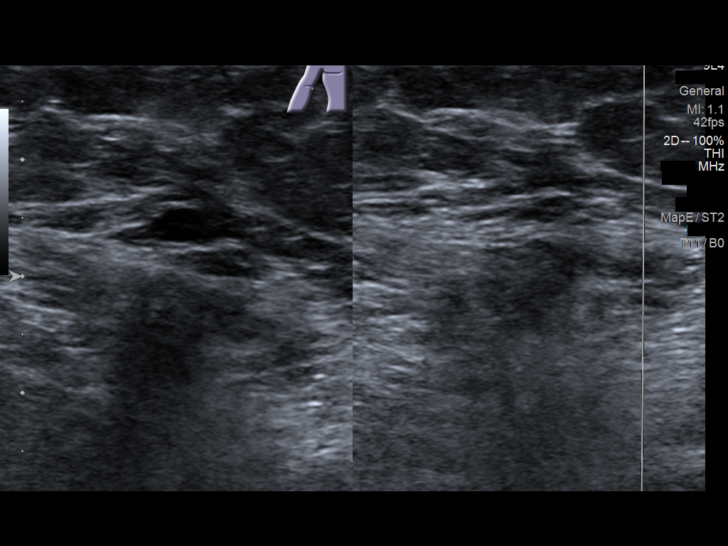
[im 14/31]
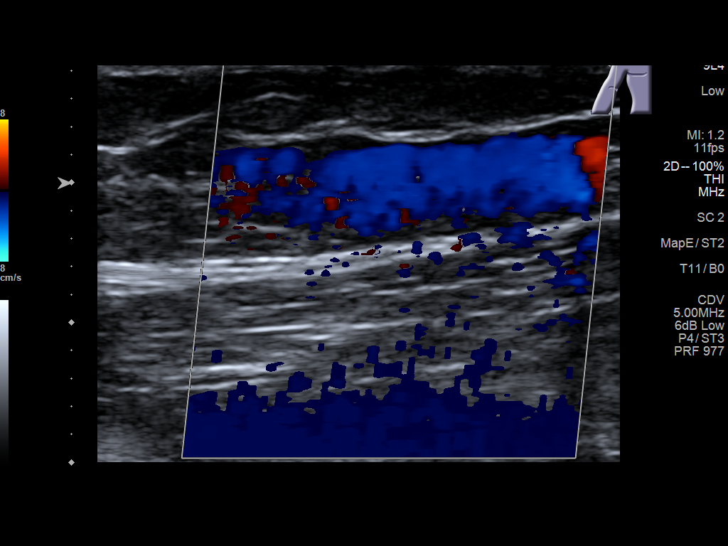
[im 16/31]
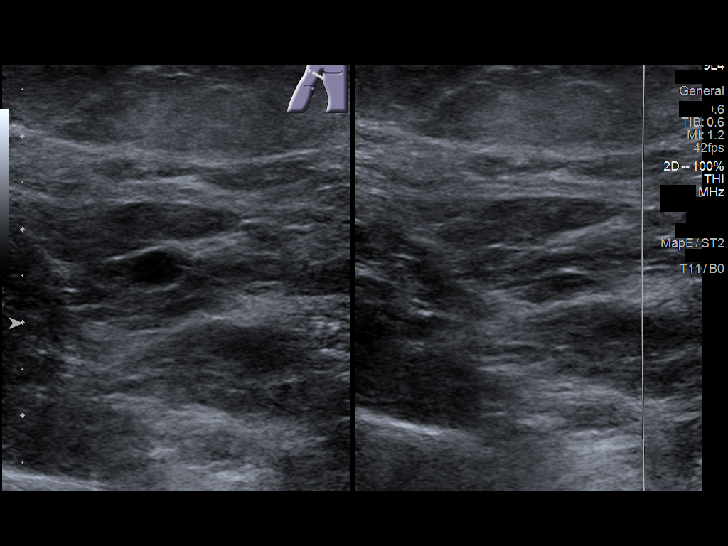
[im 17/31]
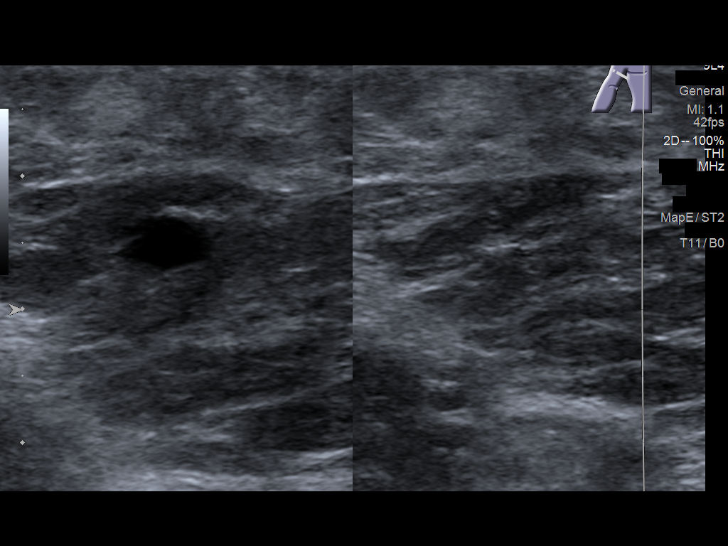
[im 20/31]
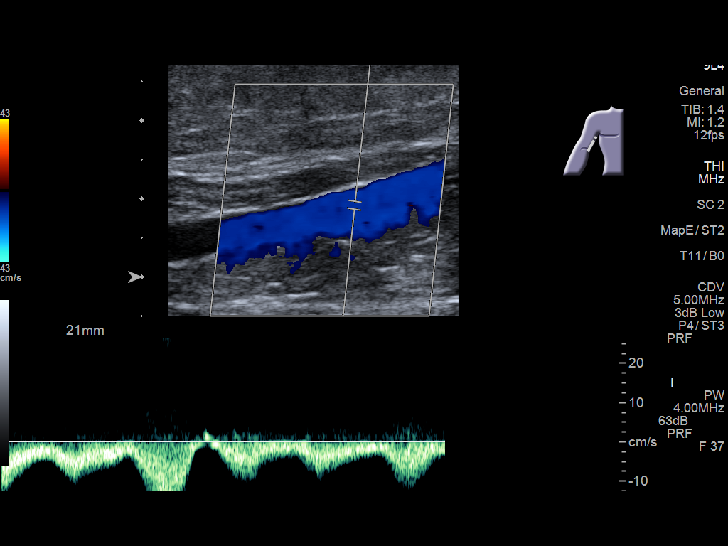
[im 23/31]
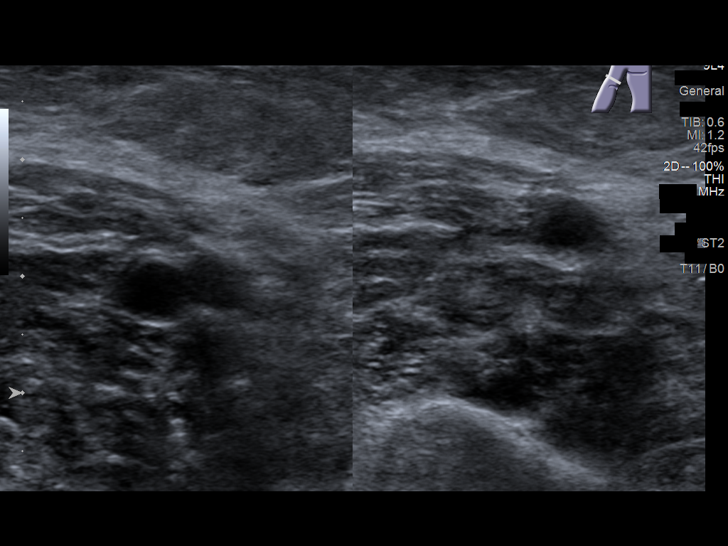
[im 25/31]
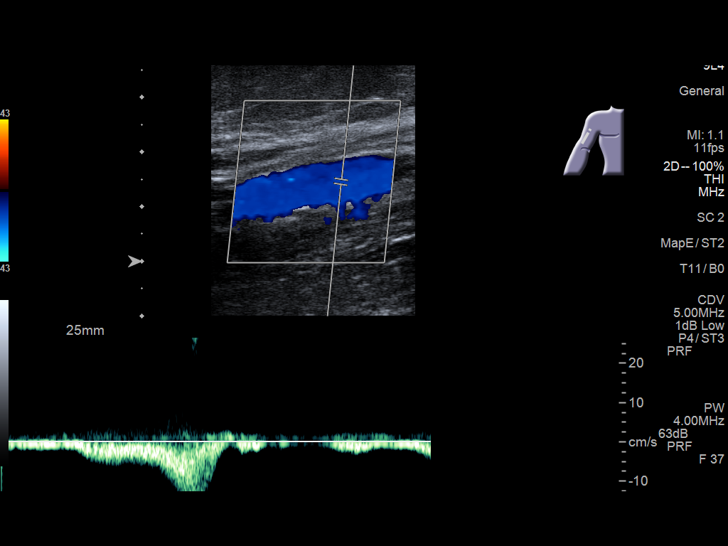
[im 28/31]
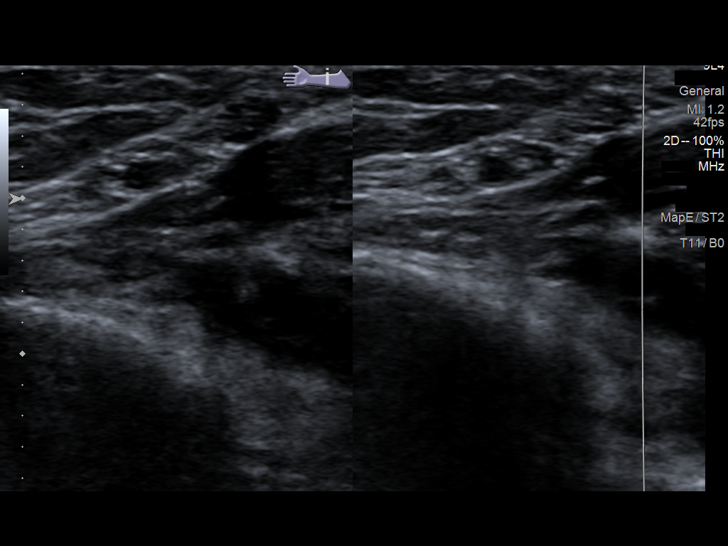
[im 31/31]
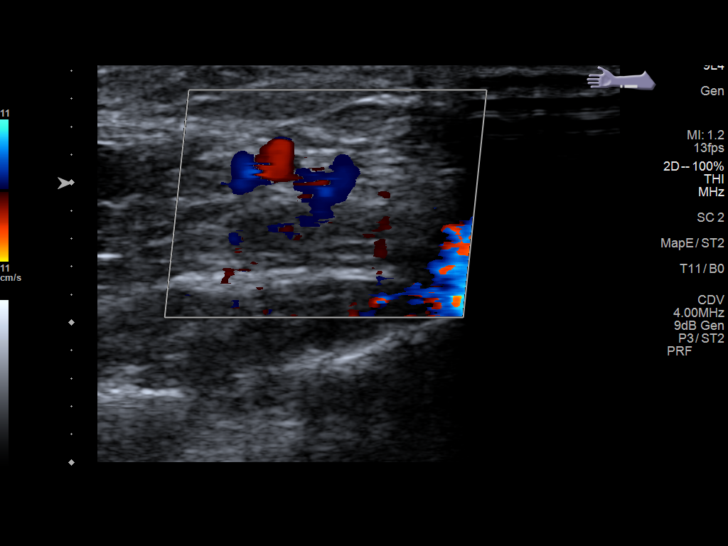

[13 of 24 positions shown; findings below may reference images not displayed]

FINDINGS: Contralateral Subclavian Vein: Respiratory phasicity is normal and
symmetric with the symptomatic side. No evidence of thrombus. Normal
compressibility.

Internal Jugular Vein: No evidence of thrombus. Normal
compressibility, respiratory phasicity and response to augmentation.

Subclavian Vein: No evidence of thrombus. Normal compressibility,
respiratory phasicity and response to augmentation.

Axillary Vein: No evidence of thrombus. Normal compressibility,
respiratory phasicity and response to augmentation.

Cephalic Vein: No evidence of thrombus. Normal compressibility,
respiratory phasicity and response to augmentation.

Basilic Vein: No evidence of thrombus. Normal compressibility,
respiratory phasicity and response to augmentation.

Brachial Veins: No evidence of thrombus. Normal compressibility,
respiratory phasicity and response to augmentation.

Radial Veins: No evidence of thrombus. Normal compressibility,
respiratory phasicity and response to augmentation.

Ulnar Veins: No evidence of thrombus. Normal compressibility,
respiratory phasicity and response to augmentation.

Venous Reflux:  None visualized.

Other Findings:  None visualized.
IMPRESSION: No evidence of DVT within the  upper extremity.

## 2021-05-21 ENCOUNTER — Other Ambulatory Visit: Payer: Self-pay | Admitting: Podiatry

## 2021-06-07 ENCOUNTER — Other Ambulatory Visit: Payer: Self-pay | Admitting: Internal Medicine

## 2021-06-07 DIAGNOSIS — Z1231 Encounter for screening mammogram for malignant neoplasm of breast: Secondary | ICD-10-CM

## 2021-07-15 ENCOUNTER — Ambulatory Visit
Admission: RE | Admit: 2021-07-15 | Discharge: 2021-07-15 | Disposition: A | Discharge status: Home / Self Care | Payer: Managed Care, Other (non HMO) | Source: Ambulatory Visit | Attending: Internal Medicine | Admitting: Internal Medicine

## 2021-07-15 ENCOUNTER — Other Ambulatory Visit: Payer: Self-pay

## 2021-07-15 DIAGNOSIS — Z1231 Encounter for screening mammogram for malignant neoplasm of breast: Secondary | ICD-10-CM

## 2021-07-21 ENCOUNTER — Other Ambulatory Visit: Payer: Self-pay | Admitting: Internal Medicine

## 2021-07-21 NOTE — Telephone Encounter (Signed)
Per note of 08/24/2020 pt to return in 1 year. Requested Prescriptions  Pending Prescriptions Disp Refills  . losartan (COZAAR) 25 MG tablet [Pharmacy Med Name: Losartan Potassium 25 MG Oral Tablet] 90 tablet 3    Sig: TAKE 1 TABLET BY MOUTH  DAILY     There is no refill protocol information for this order

## 2021-08-06 ENCOUNTER — Telehealth: Payer: Self-pay

## 2021-08-06 NOTE — Telephone Encounter (Signed)
Copied from [place] [phone]. Topic: General - Other >> Aug 03, 2021  4:23 PM McGill, Nelva Bush wrote: General/Other Onalee Hua, from [employer] is calling to follow up on pt medical records for life insurance.Caller stated faxed on the [facility] will re-fax to [phone] Please note when received.

## 2021-08-06 NOTE — Telephone Encounter (Signed)
I have not seen anything for this patient.   

## 2021-08-24 ENCOUNTER — Other Ambulatory Visit: Payer: Self-pay | Admitting: Internal Medicine

## 2021-08-26 ENCOUNTER — Other Ambulatory Visit: Payer: Self-pay

## 2021-08-26 ENCOUNTER — Encounter: Payer: Self-pay | Admitting: Internal Medicine

## 2021-08-26 ENCOUNTER — Ambulatory Visit (INDEPENDENT_AMBULATORY_CARE_PROVIDER_SITE_OTHER): Payer: Managed Care, Other (non HMO) | Admitting: Internal Medicine

## 2021-08-26 VITALS — BP 139/79 | HR 71 | Temp 97.7°F | Resp 18 | Ht 65.0 in | Wt 327.4 lb

## 2021-08-26 DIAGNOSIS — E782 Mixed hyperlipidemia: Secondary | ICD-10-CM | POA: Diagnosis not present

## 2021-08-26 DIAGNOSIS — I10 Essential (primary) hypertension: Secondary | ICD-10-CM | POA: Diagnosis not present

## 2021-08-26 DIAGNOSIS — Z0001 Encounter for general adult medical examination with abnormal findings: Secondary | ICD-10-CM | POA: Diagnosis not present

## 2021-08-26 DIAGNOSIS — Z124 Encounter for screening for malignant neoplasm of cervix: Secondary | ICD-10-CM | POA: Diagnosis not present

## 2021-08-26 DIAGNOSIS — F419 Anxiety disorder, unspecified: Secondary | ICD-10-CM | POA: Diagnosis not present

## 2021-08-26 DIAGNOSIS — F32A Depression, unspecified: Secondary | ICD-10-CM

## 2021-08-26 DIAGNOSIS — A6004 Herpesviral vulvovaginitis: Secondary | ICD-10-CM | POA: Diagnosis not present

## 2021-08-26 DIAGNOSIS — K219 Gastro-esophageal reflux disease without esophagitis: Secondary | ICD-10-CM | POA: Diagnosis not present

## 2021-08-26 MED ORDER — FLUOXETINE HCL 10 MG PO TABS: 10.0000 mg | ORAL_TABLET | Freq: Every day | ORAL | 1 refills | Status: DC

## 2021-08-26 NOTE — Assessment & Plan Note (Signed)
Encourage diet and exercise for weight loss 

## 2021-08-26 NOTE — Assessment & Plan Note (Signed)
Improved on Omeprazole every other day Encouraged weight loss as this can help reduce reflux symptoms

## 2021-08-26 NOTE — Assessment & Plan Note (Signed)
C-Met and lipid profile today Encouraged her to consume a low-fat diet Continue Rosuvastatin 

## 2021-08-26 NOTE — Assessment & Plan Note (Signed)
Not doing well on Venlafaxine, will D/C Rx for Fluoxetine 10 mg daily Support offered  Update me in 1 month via MyChart let me know how you are doing

## 2021-08-26 NOTE — Patient Instructions (Signed)

## 2021-08-26 NOTE — Progress Notes (Signed)
Subjective:    Patient ID: Margaret Carlson, female    DOB: 1976/03/28, 45 y.o.   MRN: [id number]  HPI  Pt presents to the clinic today for her annual exam. She is also due to follow up chronic conditions.  HTN: Her BP today is 139/79. She is taking Losartan and Amlodipine as prescribed. ECG from 02/2015 reviewed.  HLD: Her last LDL was 96, triglycerides 45, 07/2020. She denies myalgias on Rosuvastatin. She tries to consume a low fat diet.  Anxiety and Depression: Chronic, managed on Venlafaxine but she can not take it daily because it hypes her up. She is not currently seeing a therapist. She denies SI/HI.  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 She has a RX for Acyclovir as needed for outbreaks.  GERD: She is not sure what triggers this. She was recently started on Omeprazole for globus sensation. She takes every other day. There is no upper GI on file.  Flu: 04/2021 Tetanus: 02/2015 Covid: Moderna x 3 Pap smear: 06/2019 Mammogram: 06/2021 Colon screening: 11/2020 Vision screening: annually Dentist: biannually  Diet: She does eat meat. She consumes fruits and veggies. She tries to avoid fried foods. She drinks mostly coffee and water. Exercise: None   Review of Systems     Past Medical History:  Diagnosis Date   Allergy    Anxiety    Hyperlipidemia    Hypertension     Current Outpatient Medications  Medication Sig Dispense Refill   acyclovir (ZOVIRAX) 400 MG tablet Take 1 tablet (400 mg total) by mouth 3 (three) times daily as needed (Take for 5 days for an outbreak). 90 tablet 0   amLODipine (NORVASC) 10 MG tablet TAKE 1 TABLET BY MOUTH  DAILY 90 tablet 2   Calcium-Vitamin D-Vitamin K (CALCIUM + D + K PO) Take 1 capsule by mouth daily.     cyanocobalamin 2000 MCG tablet Take 2,000 mcg by mouth daily.     losartan (COZAAR) 25 MG tablet TAKE 1 TABLET BY MOUTH  DAILY 90 tablet 0   meloxicam (MOBIC) 15 MG tablet Take 1 tablet by mouth once daily 90 tablet 0    Multiple Vitamin (MULTIVITAMIN) tablet Take 1 tablet by mouth daily.     Norethindrone Acetate-Ethinyl Estradiol (JUNEL 1.5/30) 1.5-30 MG-MCG tablet TAKE 1 TABLET BY MOUTH  DAILY FOR 3 WEEKS THEN OFF  FOR 1 WEEK 63 tablet 3   omeprazole (PRILOSEC) 20 MG capsule Take 1 capsule (20 mg total) by mouth daily. 30 capsule 0   rosuvastatin (CRESTOR) 10 MG tablet TAKE 1 TABLET BY MOUTH  DAILY 90 tablet 2   venlafaxine XR (EFFEXOR-XR) 37.5 MG 24 hr capsule TAKE 1 CAPSULE(37.5 MG) BY MOUTH DAILY WITH BREAKFAST 90 capsule 3   No current facility-administered medications for this visit.    Allergies  Allergen Reactions   Ranitidine Swelling    Face and lip swelling    Family History  Problem Relation Age of Onset   Arthritis Maternal Grandmother    Hypertension Maternal Grandmother    Breast cancer Maternal Grandmother 77   Colon cancer Neg Hx    Colon polyps Neg Hx    Rectal cancer Neg Hx    Stomach cancer Neg Hx    Esophageal cancer Neg Hx     Social History   Socioeconomic History   Marital status: Single    Spouse name: Not on file   Number of children: Not on file   Years of education: Not on file  Highest education level: Not on file  Occupational History   Not on file  Tobacco Use   Smoking status: Never   Smokeless tobacco: Never  Vaping Use   Vaping Use: Never used  Substance and Sexual Activity   Alcohol use: Yes    Alcohol/week: 0.0 standard drinks    Comment: occasional   Drug use: No   Sexual activity: Yes    Partners: Male    Birth control/protection: Pill  Other Topics Concern   Not on file  Social History Narrative   Not on file   Social Determinants of Health   Financial Resource Strain: Not on file  Food Insecurity: Not on file  Transportation Needs: Not on file  Physical Activity: Not on file  Stress: Not on file  Social Connections: Not on file  Intimate Partner Violence: Not on file     Constitutional: Pt reports unintentional weight gain.  Denies fever, malaise, fatigue, headache.  HEENT: Denies eye pain, eye redness, ear pain, ringing in the ears, wax buildup, runny nose, nasal congestion, bloody nose, or sore throat. Respiratory: Denies difficulty breathing, shortness of breath, cough or sputum production.   Cardiovascular: Denies chest pain, chest tightness, palpitations or swelling in the hands or feet.  Gastrointestinal: Denies abdominal pain, bloating, constipation, diarrhea or blood in the stool.  GU: Denies urgency, frequency, pain with urination, burning sensation, blood in urine, odor or discharge. Musculoskeletal: Denies decrease in range of motion, difficulty with gait, muscle pain or joint pain and swelling.  Skin: Denies redness, rashes, lesions or ulcercations.  Neurological: Denies dizziness, difficulty with memory, difficulty with speech or problems with balance and coordination.  Psych: Pt has a history of anxiety and depression. Denies SI/HI.  No other specific complaints in a complete review of systems (except as listed in HPI above).  Objective:   Physical Exam  BP 139/79 (BP Location: Right Arm, Patient Position: Sitting, Cuff Size: Large)    Pulse 71    Temp 97.7 F (36.5 C) (Temporal)    Resp 18    Ht 5\' 5"  (1.651 m)    Wt (!) 327 lb 6.4 oz (148.5 kg)    SpO2 100%    BMI 54.48 kg/m   Wt Readings from Last 3 Encounters:  03/04/21 298 lb 9.6 oz (135.4 kg)  12/22/20 285 lb (129.3 kg)  12/08/20 285 lb 9.6 oz (129.5 kg)    General: Appears her stated age, obese, in NAD. Skin: Warm, dry and intact.  HEENT: Head: normal shape and size; Eyes: sclera white and EOMs intact;  Neck:  Neck supple, trachea midline. No masses, lumps or thyromegaly present.  Cardiovascular: Normal rate and rhythm. S1,S2 noted.  No murmur, rubs or gallops noted.  Trace pitting BLE edema.  Pulmonary/Chest: Normal effort and positive vesicular breath sounds. No respiratory distress. No wheezes, rales or ronchi noted.  Abdomen: Soft  and nontender. Normal bowel sounds. Pelvic: Normal female anatomy.  Small amount of thin white discharge noted without odor.  Cervix without changes.  No CMT.  Adnexa nonpalpable. Musculoskeletal: Strength 5/5 BUE/BLE no difficulty with gait.  Neurological: Alert and oriented. Cranial nerves II-XII grossly intact. Coordination normal.  Psychiatric: Mood and affect mildly flat. Behavior is normal. Judgment and thought content normal.     BMET    Component Value Date/Time   NA 137 08/24/2020 1626   NA 137 07/03/2013 2132   K 3.7 08/24/2020 1626   K 3.6 07/03/2013 2132   CL 101 08/24/2020 1626  CL 103 07/03/2013 2132   CO2 22 08/24/2020 1626   CO2 29 07/03/2013 2132   GLUCOSE 78 08/24/2020 1626   GLUCOSE 118 (H) 06/18/2018 1756   GLUCOSE 90 07/03/2013 2132   BUN 13 08/24/2020 1626   BUN 9 07/03/2013 2132   CREATININE 0.97 08/24/2020 1626   CREATININE 0.85 07/03/2013 2132   CALCIUM 8.3 (L) 08/24/2020 1626   CALCIUM 9.0 07/03/2013 2132   GFRNONAA 71 08/24/2020 1626   GFRNONAA >60 07/03/2013 2132   GFRAA 82 08/24/2020 1626   GFRAA >60 07/03/2013 2132    Lipid Panel     Component Value Date/Time   CHOL 168 08/24/2020 1626   TRIG 45 08/24/2020 1626   HDL 63 08/24/2020 1626   CHOLHDL 2.7 08/24/2020 1626   LDLCALC 96 08/24/2020 1626    CBC    Component Value Date/Time   WBC 5.5 08/24/2020 1626   WBC 7.7 06/18/2018 1756   RBC 5.02 08/24/2020 1626   RBC 6.05 (H) 06/18/2018 1756   HGB 12.3 08/24/2020 1626   HCT 36.8 08/24/2020 1626   PLT 228 08/24/2020 1626   MCV 73 (L) 08/24/2020 1626   MCV 73 (L) 07/03/2013 2132   MCH 24.5 (L) 08/24/2020 1626   MCH 23.0 (L) 06/18/2018 1756   MCHC 33.4 08/24/2020 1626   MCHC 33.1 06/18/2018 1756   RDW 19.6 (H) 08/24/2020 1626   RDW 19.1 (H) 07/03/2013 2132   LYMPHSABS 2.5 03/17/2015 1618   EOSABS 0.1 03/17/2015 1618   BASOSABS 0.0 03/17/2015 1618    Hgb A1C Lab Results  Component Value Date   HGBA1C 5.3 08/24/2020            Assessment & Plan:   Preventative Health Maintenance:  Flu shot UTD Tetanus UTD Encouraged her to get her covid booster Pap smear today with STD screening Mammogram UTD Colon screening UTD Encouraged her to consume a balanced diet and exercise regimen Advised her to see an eye doctor and dentist annually Will check CBC, CMET, TSH, Lipid, A1C and Vit D  RTC in 1 year, sooner if needed Webb Silversmith, NP This visit occurred during the SARS-CoV-2 public health emergency.  Safety protocols were in place, including screening questions prior to the visit, additional usage of staff PPE, and extensive cleaning of exam room while observing appropriate contact time as indicated for disinfecting solutions.

## 2021-08-26 NOTE — Assessment & Plan Note (Signed)
Controlled on Losartan and Amlodipine She does have some lower extremity edema but declines Rx for diuretic at this time Reinforced DASH diet and exercise for weight loss C-Met today

## 2021-08-26 NOTE — Assessment & Plan Note (Signed)
Continue Acyclovir as needed

## 2021-08-27 LAB — CBC
Hematocrit: 36.5 % (ref 34.0–46.6)
Hemoglobin: 12.1 g/dL (ref 11.1–15.9)
MCH: 24.3 pg — ABNORMAL LOW (ref 26.6–33.0)
MCHC: 33.2 g/dL (ref 31.5–35.7)
MCV: 73 fL — ABNORMAL LOW (ref 79–97)
Platelets: 275 10*3/uL (ref 150–450)
RBC: 4.98 x10E6/uL (ref 3.77–5.28)
RDW: 19.7 % — ABNORMAL HIGH (ref 11.7–15.4)
WBC: 6.3 10*3/uL (ref 3.4–10.8)

## 2021-08-27 LAB — COMPREHENSIVE METABOLIC PANEL
ALT: 9 IU/L (ref 0–32)
AST: 18 IU/L (ref 0–40)
Albumin/Globulin Ratio: 1.1 — ABNORMAL LOW (ref 1.2–2.2)
Albumin: 3.6 g/dL — ABNORMAL LOW (ref 3.8–4.8)
Alkaline Phosphatase: 67 IU/L (ref 44–121)
BUN/Creatinine Ratio: 11 (ref 9–23)
BUN: 12 mg/dL (ref 6–24)
Bilirubin Total: <0.2 mg/dL (ref 0.0–1.2)
CO2: 24 mmol/L (ref 20–29)
Calcium: 8.5 mg/dL — ABNORMAL LOW (ref 8.7–10.2)
Chloride: 103 mmol/L (ref 96–106)
Creatinine, Ser: 1.14 mg/dL — ABNORMAL HIGH (ref 0.57–1.00)
Globulin, Total: 3.3 g/dL (ref 1.5–4.5)
Glucose: 76 mg/dL (ref 70–99)
Potassium: 3.9 mmol/L (ref 3.5–5.2)
Sodium: 138 mmol/L (ref 134–144)
Total Protein: 6.9 g/dL (ref 6.0–8.5)
eGFR: 60 mL/min/{1.73_m2} (ref >59)

## 2021-08-27 LAB — LIPID PANEL
Chol/HDL Ratio: 3.1 ratio (ref 0.0–4.4)
Cholesterol, Total: 179 mg/dL (ref 100–199)
HDL: 57 mg/dL (ref >39)
LDL Chol Calc (NIH): 110 mg/dL — ABNORMAL HIGH (ref 0–99)
Triglycerides: 65 mg/dL (ref 0–149)
VLDL Cholesterol Cal: 12 mg/dL (ref 5–40)

## 2021-08-27 LAB — HEMOGLOBIN A1C
Est. average glucose Bld gHb Est-mCnc: 108 mg/dL
Hgb A1c MFr Bld: 5.4 % (ref 4.8–5.6)

## 2021-08-27 LAB — VITAMIN D 25 HYDROXY (VIT D DEFICIENCY, FRACTURES): Vit D, 25-Hydroxy: 20.1 ng/mL — ABNORMAL LOW (ref 30.0–100.0)

## 2021-08-27 LAB — TSH: TSH: 1.27 u[IU]/mL (ref 0.450–4.500)

## 2021-08-31 LAB — PAP IG AND CT-NG NAA
Chlamydia, Nuc. Acid Amp: NEGATIVE
Gonococcus by Nucleic Acid Amp: NEGATIVE

## 2021-09-06 ENCOUNTER — Telehealth: Payer: Self-pay

## 2021-09-13 NOTE — Telephone Encounter (Signed)
Open in error

## 2021-09-27 ENCOUNTER — Ambulatory Visit (INDEPENDENT_AMBULATORY_CARE_PROVIDER_SITE_OTHER): Payer: BC Managed Care – PPO | Admitting: Family Medicine

## 2021-09-27 ENCOUNTER — Encounter: Payer: Self-pay | Admitting: Family Medicine

## 2021-09-27 ENCOUNTER — Other Ambulatory Visit: Payer: Self-pay

## 2021-09-27 VITALS — BP 144/84 | HR 81 | Temp 98.0°F | Resp 18 | Ht 65.0 in | Wt 323.4 lb

## 2021-09-27 DIAGNOSIS — L03031 Cellulitis of right toe: Secondary | ICD-10-CM

## 2021-09-27 MED ORDER — MUPIROCIN 2 % EX OINT: 1.0000 "application " | TOPICAL_OINTMENT | Freq: Two times a day (BID) | CUTANEOUS | 0 refills | Status: DC | PRN

## 2021-09-27 MED ORDER — DOXYCYCLINE HYCLATE 100 MG PO TABS: 100.0000 mg | ORAL_TABLET | Freq: Two times a day (BID) | ORAL | 0 refills | Status: DC

## 2021-09-27 MED ORDER — TRIAMCINOLONE ACETONIDE 0.5 % EX CREA: 1.0000 "application " | TOPICAL_CREAM | Freq: Two times a day (BID) | CUTANEOUS | 0 refills | Status: DC | PRN

## 2021-09-27 NOTE — Progress Notes (Signed)
Subjective:    Patient ID: Margaret Carlson, female    DOB: [birth year], 46 y.o.   MRN: [id number]  Margaret Carlson is a 46 y.o. female presenting on 09/27/2021 for Toe Pain (Pt complains of Rt great toe pain. She clipped her great toenail on Friday because it was [place] and pushing on the tip of her shoe. She notice that her cuticle area was painful so she soaked it in Epsom salt. Today she notice that the toe was very painful with a throbbing feeling. )   HPI  Right Great Toe Paronychia infection Reports acute onset injury to R great toe, she was clipping toenail, and it was painful, she soaked toenail with epsom salt, it did help some, and throbbing at work. Now has R great toe pain with ambulation and weight bearing. Admits mild tingling pins and needles Denies drainage of fluid or pus, fever chills nausea vomiting   Depression screen [facility] 2/9 09/27/2021 08/26/2021 03/04/2021  Decreased Interest 0 0 0  Down, Depressed, Hopeless 0 0 0  PHQ - 2 Score 0 0 0  Altered sleeping 0 0 0  Tired, decreased energy 0 0 1  Change in appetite 0 0 0  Feeling bad or failure about yourself  0 0 0  Trouble concentrating 0 0 0  Moving slowly or fidgety/restless 0 0 0  Suicidal thoughts 0 0 0  PHQ-9 Score 0 0 1  Difficult doing work/chores Not difficult at all Not difficult at all Not difficult at all    Social History   Tobacco Use   Smoking status: Never   Smokeless tobacco: Never  Vaping Use   Vaping Use: Never used  Substance Use Topics   Alcohol use: Yes    Alcohol/week: 0.0 standard drinks    Comment: occasional   Drug use: No    Review of Systems Per HPI unless specifically indicated above     Objective:    BP (!) 144/84 (BP Location: Left Arm, Cuff Size: Large)    Pulse 81    Temp 98 F (36.7 C) (Temporal)    Resp 18    Ht 5' 5"  (1.651 m)    Wt (!) 323 lb 6.4 oz (146.7 kg)    SpO2 100%    BMI 53.82 kg/m   Wt Readings from Last 3 Encounters:  09/27/21 (!) 323 lb 6.4 oz (146.7 kg)   08/26/21 (!) 327 lb 6.4 oz (148.5 kg)  03/04/21 298 lb 9.6 oz (135.4 kg)    Physical Exam Vitals and nursing note reviewed.  Constitutional:      General: She is not in acute distress.    Appearance: Normal appearance. She is well-developed. She is not diaphoretic.     Comments: Well-appearing, comfortable, cooperative  HENT:     Head: Normocephalic and atraumatic.  Eyes:     General:        Right eye: No discharge.        Left eye: No discharge.     Conjunctiva/sclera: Conjunctivae normal.  Cardiovascular:     Rate and Rhythm: Normal rate.  Pulmonary:     Effort: Pulmonary effort is normal.  Skin:    General: Skin is warm and dry.     Findings: Lesion (R Great Toe with discoloration pale raised swollen cuticle and surrounding aspect of toe, mild warmth) present. No erythema or rash.  Neurological:     Mental Status: She is alert and oriented to person, place, and time.  Psychiatric:  Mood and Affect: Mood normal.        Behavior: Behavior normal.        Thought Content: Thought content normal.     Comments: Well groomed, good eye contact, normal speech and thoughts   Results for orders placed or performed in visit on 08/26/21  CBC  Result Value Ref Range   WBC 6.3 3.4 - 10.8 x10E3/uL   RBC 4.98 3.77 - 5.28 x10E6/uL   Hemoglobin 12.1 11.1 - 15.9 g/dL   Hematocrit 36.5 34.0 - 46.6 %   MCV 73 (L) 79 - 97 fL   MCH 24.3 (L) 26.6 - 33.0 pg   MCHC 33.2 31.5 - 35.7 g/dL   RDW 19.7 (H) 11.7 - 15.4 %   Platelets 275 150 - 450 x10E3/uL  Comprehensive metabolic panel  Result Value Ref Range   Glucose 76 70 - 99 mg/dL   BUN 12 6 - 24 mg/dL   Creatinine, Ser 1.14 (H) 0.57 - 1.00 mg/dL   eGFR 60 >59 mL/min/1.73   BUN/Creatinine Ratio 11 9 - 23   Sodium 138 134 - 144 mmol/L   Potassium 3.9 3.5 - 5.2 mmol/L   Chloride 103 96 - 106 mmol/L   CO2 24 20 - 29 mmol/L   Calcium 8.5 (L) 8.7 - 10.2 mg/dL   Total Protein 6.9 6.0 - 8.5 g/dL   Albumin 3.6 (L) 3.8 - 4.8 g/dL    Globulin, Total 3.3 1.5 - 4.5 g/dL   Albumin/Globulin Ratio 1.1 (L) 1.2 - 2.2   Bilirubin Total <0.2 0.0 - 1.2 mg/dL   Alkaline Phosphatase 67 44 - 121 IU/L   AST 18 0 - 40 IU/L   ALT 9 0 - 32 IU/L  TSH  Result Value Ref Range   TSH 1.270 0.450 - 4.500 uIU/mL  Lipid panel  Result Value Ref Range   Cholesterol, Total 179 100 - 199 mg/dL   Triglycerides 65 0 - 149 mg/dL   HDL 57 >39 mg/dL   VLDL Cholesterol Cal 12 5 - 40 mg/dL   LDL Chol Calc (NIH) 110 (H) 0 - 99 mg/dL   Chol/HDL Ratio 3.1 0.0 - 4.4 ratio  Hemoglobin A1c  Result Value Ref Range   Hgb A1c MFr Bld 5.4 4.8 - 5.6 %   Est. average glucose Bld gHb Est-mCnc 108 mg/dL  VITAMIN D 25 Hydroxy (Vit-D Deficiency, Fractures)  Result Value Ref Range   Vit D, 25-Hydroxy 20.1 (L) 30.0 - 100.0 ng/mL  Pap IG and Chlamydia/Gonococcus, NAA  Result Value Ref Range   Interpretation NILM    Category NIL    Adequacy SECNI    Clinician Provided ICD10 Comment    Performed by: Comment    Note: Comment    Test Methodology Comment    Chlamydia, Nuc. Acid Amp Negative Negative   Gonococcus by Nucleic Acid Amp Negative Negative      Assessment & Plan:   Problem List Items Addressed This Visit   None Visit Diagnoses     Paronychia of great toe of right foot    -  Primary   Relevant Medications   doxycycline (VIBRA-TABS) 100 MG tablet   mupirocin ointment (BACTROBAN) 2 %   triamcinolone cream (KENALOG) 0.5 %       R great toenail with paronychia at nail bed No active drainage of pus, but has fluctuance, some surrounding mild erythema edema and pain. Afebrile without systemic symptoms or extending cellulitis. Neurovascular intact.  Plan: 1. Start Doxycycline antibiotic  course 2. Start Mupirocin topical PRN if need 3. Start topical steroid Triamcinolone for inflammation 4. Recommend warm water soaks to assist drainage several times a day for now 5. Ibuprofen PRN May return to [facility] if needed if unresolved Return  criteria given. Follow-up PRN   Meds ordered this encounter  Medications   doxycycline (VIBRA-TABS) 100 MG tablet    Sig: Take 1 tablet (100 mg total) by mouth 2 (two) times daily. For 10 days. Take with full glass of water, stay upright 30 min after taking.    Dispense:  20 tablet    Refill:  0   mupirocin ointment (BACTROBAN) 2 %    Sig: Apply 1 application topically 2 (two) times daily as needed. For paronychia toe infection, as need.    Dispense:  22 g    Refill:  0   triamcinolone cream (KENALOG) 0.5 %    Sig: Apply 1 application topically 2 (two) times daily as needed. To affected areas, for up to 2 weeks. To toe for swelling inflammation    Dispense:  30 g    Refill:  0      Follow up plan: Return if symptoms worsen or fail to improve.  Nobie Putnam, [facility] 09/27/2021, 3:15 PM

## 2021-09-27 NOTE — Patient Instructions (Addendum)
Thank you for coming to the office today.  Start taking Doxycycline antibiotic 100mg  twice daily for 10 days. Take with full glass of water and stay upright for at least 30 min after taking, may be seated or standing, but should NOT lay down. This is just a safety precaution, if this medicine does not go all the way down throat well it could cause some burning discomfort to throat and esophagus.  Use triamcinolone cream for topical steroid twice a day for swelling inflammation  Ice packs can help swelling.  If still not resolving or prefer to add extra, can do topical ointment for antibiotic coverage. Mupirocin Bactroban twice a day for up to 7-10 days.  -----------------------------  Paronychia Paronychia is an infection of the skin that surrounds a nail. It usually affects the skin around a fingernail, but it may also occur near a toenail. It often causes pain and swelling around the nail. In some cases, a collection of pus (abscess) can form near or under the nail.  This condition may develop suddenly, or it may develop gradually over a longer period. In most cases, paronychia is not serious, and it will clear up with treatment. What are the causes? This condition may be caused by bacteria or a fungus, such as yeast. The bacteria or fungus can enter the body through an opening in the skin, such as a cut or a hangnail, and cause an infection in your fingernail or toenail. Other causes may include: Recurrent injury to the fingernail or toenail area. Irritation of the base and sides of the nail (cuticle). Injury and irritation can result in inflammation, swelling, and thickened skin around the nail. What increases the risk? This condition is more likely to develop in people who: Get their hands wet often, such as those who work as Designer, industrial/product, bartenders, or housekeepers. Bite their fingernails or cuticles. Have underlying skin conditions. Have hangnails or injured fingertips. Are exposed  to irritants like detergents and other chemicals. Have diabetes. What are the signs or symptoms? Symptoms of this condition include: Redness and swelling of the skin near the nail. Tenderness around the nail when you touch the area. Pus-filled bumps under the cuticle. Fluid or pus under the nail. Throbbing pain in the area. How is this diagnosed? This condition is diagnosed with a physical exam. In some cases, a sample of pus may be tested to determine what type of bacteria or fungus is causing the condition. How is this treated? Treatment depends on the cause and severity of your condition. If your condition is mild, it may clear up on its own in a few days or after soaking in warm water. If needed, treatment may include: Antibiotic medicine, if your infection is caused by bacteria. Antifungal medicine, if your infection is caused by a fungus. A procedure to drain pus from an abscess. Anti-inflammatory medicine (corticosteroids). Removal of part of an ingrown toenail. A bandage (dressing) may be placed over the affected area if an abscess or part of a nail has been removed. Follow these instructions at home: Wound care Keep the affected area clean. Soak the affected area in warm water if told to do so by your health care provider. You may be told to do this for 20 minutes, 2-3 times a day. Keep the area dry when you are not soaking it. Do not try to drain an abscess yourself. Follow instructions from your health care provider about how to take care of the affected area. Make sure you: [facility]  your hands with soap and water for at least 20 seconds before and after you change your dressing. If soap and water are not available, use hand sanitizer. Change your dressing as told by your health care provider. If you had an abscess drained, check the area every day for signs of infection. Check for: Redness, swelling, or pain. Fluid or blood. Warmth. Pus or a bad smell. Medicines  Take  over-the-counter and prescription medicines only as told by your health care provider. If you were prescribed an antibiotic medicine, take it as told by your health care provider. Do not stop taking the antibiotic even if you start to feel better. General instructions Avoid contact with any skin irritants or allergens. Do not pick at the affected area. Keep all follow-up visits as told. This is important. Prevention To prevent this condition from happening again: Wear rubber gloves when washing dishes or doing other tasks that require your hands to get wet. Wear gloves if your hands might come in contact with cleaners or other chemicals. Avoid injuring your nails or fingertips. Do not bite your nails or tear hangnails. Do not cut your nails very short. Do not cut your cuticles. Use clean nail clippers or scissors when trimming nails. Contact a health care provider if: Your symptoms get worse or do not improve with treatment. You have continued or increased fluid, blood, or pus coming from the affected area. Your affected finger, toe, or joint becomes swollen or difficult to move. You have a fever or chills. There is redness spreading away from the affected area. Summary Paronychia is an infection of the skin that surrounds a nail. It often causes pain and swelling around the nail. In some cases, a collection of pus (abscess) can form near or under the nail. This condition may be caused by bacteria or a fungus. These germs can enter the body through an opening in the skin, such as a cut or a hangnail. If your condition is mild, it may clear up on its own in a few days. If needed, treatment may include medicine or a procedure to drain pus from an abscess. To prevent this condition from happening again, wear gloves if doing tasks that require your hands to get wet or to come in contact with chemicals. Also avoid injuring your nails or fingertips. This information is not intended to replace  advice given to you by your health care provider. Make sure you discuss any questions you have with your health care provider. Document Revised: 11/16/2020 Document Reviewed: 11/16/2020 Elsevier Patient Education  [place].     Please schedule a Follow-up Appointment to: Return if symptoms worsen or fail to improve.  If you have any other questions or concerns, please feel free to call the office or send a message through [place]. You may also schedule an earlier appointment if necessary.  Additionally, you may be receiving a survey about your experience at our office within a few days to 1 week by e-mail or mail. We value your feedback.  Nobie Putnam, DO [place]

## 2021-10-03 ENCOUNTER — Other Ambulatory Visit: Payer: Self-pay | Admitting: Internal Medicine

## 2021-10-04 ENCOUNTER — Other Ambulatory Visit: Payer: Self-pay | Admitting: Internal Medicine

## 2021-10-04 NOTE — Telephone Encounter (Signed)
Requested medication (s) are due for refill today: no  Requested medication (s) are on the active medication list: yes  Last refill:  07/21/21 #90 0 refills  Future visit scheduled: no   Notes to clinic:  requesting 1 year supply . Do you want to refill for 1 year?     Requested Prescriptions  Pending Prescriptions Disp Refills   losartan (COZAAR) 25 MG tablet [Pharmacy Med Name: Losartan Potassium 25 MG Oral Tablet] 90 tablet 3    Sig: TAKE 1 TABLET BY MOUTH  DAILY     Cardiovascular:  Angiotensin Receptor Blockers Failed - 10/04/2021  2:47 PM      Failed - Cr in normal range and within 180 days    Creatinine  Date Value Ref Range Status  07/03/2013 0.85 0.60 - 1.30 mg/dL Final   Creatinine, Ser  Date Value Ref Range Status  08/26/2021 1.14 (H) 0.57 - 1.00 mg/dL Final          Failed - Last BP in normal range    BP Readings from Last 1 Encounters:  09/27/21 (!) 144/84          Passed - K in normal range and within 180 days    Potassium  Date Value Ref Range Status  08/26/2021 3.9 3.5 - 5.2 mmol/L Final  07/03/2013 3.6 3.5 - 5.1 mmol/L Final          Passed - Patient is not pregnant      Passed - Valid encounter within last 6 months    Recent Outpatient Visits           1 week ago Paronychia of great toe of right foot   [place], DO   1 month ago Encounter for general adult medical examination with abnormal findings   [facility] [place], Coralie Keens, NP   7 months ago Globus sensation   [facility] [place], Coralie Keens, [place]

## 2021-10-05 NOTE — Telephone Encounter (Signed)
Requested Prescriptions  Pending Prescriptions Disp Refills   omeprazole (PRILOSEC) 20 MG capsule [Pharmacy Med Name: Omeprazole 20 MG Oral Capsule Delayed Release] 30 capsule 0    Sig: Take 1 capsule by mouth once daily     Gastroenterology: Proton Pump Inhibitors Passed - 10/04/2021  8:41 AM      Passed - Valid encounter within last 12 months    Recent Outpatient Visits          1 week ago Paronychia of great toe of right foot   [place], DO   1 month ago Encounter for general adult medical examination with abnormal findings   [facility] [place], Coralie Keens, NP   7 months ago Globus sensation   [facility] [place], Coralie Keens, [place]

## 2021-10-09 ENCOUNTER — Other Ambulatory Visit: Payer: Self-pay | Admitting: Internal Medicine

## 2021-10-09 DIAGNOSIS — Z01419 Encounter for gynecological examination (general) (routine) without abnormal findings: Secondary | ICD-10-CM

## 2021-10-11 NOTE — Telephone Encounter (Signed)
Per note of 08/26/2021 pt is rtc in 1 year.  Requested Prescriptions  Pending Prescriptions Disp Refills   amLODipine (NORVASC) 10 MG tablet [Pharmacy Med Name: amLODIPine Besylate 10 MG Oral Tablet] 90 tablet 3    Sig: TAKE 1 TABLET BY MOUTH  DAILY     There is no refill protocol information for this order

## 2021-10-14 ENCOUNTER — Ambulatory Visit (INDEPENDENT_AMBULATORY_CARE_PROVIDER_SITE_OTHER): Payer: BC Managed Care – PPO | Admitting: Internal Medicine

## 2021-10-14 ENCOUNTER — Other Ambulatory Visit: Payer: Self-pay

## 2021-10-14 ENCOUNTER — Encounter: Payer: Self-pay | Admitting: Internal Medicine

## 2021-10-14 VITALS — BP 162/86 | HR 88 | Temp 97.3°F | Wt 324.0 lb

## 2021-10-14 DIAGNOSIS — L03031 Cellulitis of right toe: Secondary | ICD-10-CM | POA: Diagnosis not present

## 2021-10-14 DIAGNOSIS — I10 Essential (primary) hypertension: Secondary | ICD-10-CM | POA: Diagnosis not present

## 2021-10-14 MED ORDER — CEPHALEXIN 500 MG PO CAPS: 500.0000 mg | ORAL_CAPSULE | Freq: Three times a day (TID) | ORAL | 0 refills | Status: DC

## 2021-10-14 NOTE — Patient Instructions (Signed)
Paronychia Paronychia is an infection of the skin. It happens near a fingernail or toenail. It may cause pain and swelling around the nail. In some cases, a fluid-filled bump (abscess) can form near or under the nail. Often, this condition is not serious, and it clears up with treatment. What are the causes? This condition may be caused by a germ. The germ may be bacteria or a fungus. These germs can enter the body through an opening in the skin, such as a cut or a hangnail. Other causes include: Repeated injuries to your fingernails or toenails. Irritation of the base and sides of the nail (cuticle). What increases the risk? This condition is more likely to develop in people who: Get their hands wet often, such as a dishwasher. Bite their fingernails or the base and sides of their nails. Have other skin problems. Have hangnails or hurt fingertips. Come into contact with chemicals like detergents. Have diabetes. What are the signs or symptoms? Redness and swelling of the skin near the nail. A tender feeling around the nail. Pus-filled bumps under the skin at the base and sides of the nail. Fluid or pus under the nail. Pain in the area. How is this treated? Treatment depends on the cause of your condition and how bad it is. If your condition is mild, it may clear up on its own in a few days or after soaking in warm water. If needed, treatment may include: Antibiotic medicine. Antifungal medicine. A procedure to drain pus from a fluid-filled bump. Medicine to treat irritation and swelling (corticosteroids). Taking off part of an ingrown toenail. A bandage (dressing) may be placed over the nail area. Follow these instructions at home: Wound care Keep the affected area clean. Soak the fingers or toes in warm water as told by your doctor. You may be told to do this for 20 minutes, 2-3 times a day. Keep the area dry when you are not soaking it. Do not try to drain a fluid-filled bump on  your own. Follow instructions from your doctor about how to take care of the affected area. Make sure you: Wash your hands with soap and water for at least 20 seconds before and after you change your bandage. If you cannot use soap and water, use hand sanitizer. Change your bandage as told by your doctor. If you had a fluid-filled bump and your doctor drained it, check the area every day for signs of infection. Check for: Redness, swelling, or pain. Fluid or blood. Warmth. Pus or a bad smell. Medicines  Take over-the-counter and prescription medicines only as told by your doctor. If you were prescribed an antibiotic medicine, take it as told by your doctor. Do not stop taking it even if you start to feel better. General instructions Avoid contact with anything that irritates your skin or that you are allergic to. Do not pick at the affected area. Keep all follow-up visits. Prevention To prevent this condition from happening again: Wear rubber gloves when putting your hands in water for washing dishes or other tasks. Wear gloves if your hands might touch cleaners or chemicals. Avoid injuring your nails or fingertips. Do not bite your nails or tear hangnails. Do not cut your nails very short. Do not cut the skin at the base and sides of the nail. Use clean nail clippers or scissors when trimming nails. Contact a doctor if: You feel worse. You do not get better. You keep having or you have more fluid, blood, or  pus coming from the affected area. Your affected finger, toe, or joint gets swollen or hard to move. You have a fever or chills. There is redness spreading from the affected area. Summary Paronychia is an infection of the skin. It happens near a fingernail or toenail. This condition may cause pain and swelling around the nail. Soak the fingers or toes in warm water as told by your doctor. Often, this condition is not serious, and it clears up with treatment. This information  is not intended to replace advice given to you by your health care provider. Make sure you discuss any questions you have with your health care provider. Document Revised: 11/16/2020 Document Reviewed: 11/16/2020 Elsevier Patient Education  [place].

## 2021-10-14 NOTE — Assessment & Plan Note (Signed)
Elevated today, she thinks this is pain related Continue Amlodipine and Losartan as prescribed Reinforced DASH diet and exercise for weight loss  RTC in 2 weeks for repeat blood pressure check

## 2021-10-14 NOTE — Progress Notes (Signed)
Subjective:    Patient ID: Margaret Carlson, female    DOB: 04/25/1976, 46 y.o.   MRN: [id number]  HPI  Patient presents to clinic today for follow-up of paronychia of her toe.  She saw Dr. Parks Ranger 09/27/2021 for the same.  She was prescribed Doxycycline, Bactroban and Triamcinolone.  She has taken the medication as prescribed.  She reports her symptoms have not resolved.  She describes the pain as throbbing and is having difficulty walking.  She continues to see pus under and around the nail.  She thinks she needs to go see a podiatrist, she has seen Dr. Milinda Pointer in the past.  Of note, her BP today is 162/86.  She is taking amlodipine and losartan as prescribed.  She thinks her blood pressure is elevated because of the pain  Review of Systems     Past Medical History:  Diagnosis Date   Allergy    Anxiety    Hyperlipidemia    Hypertension     Current Outpatient Medications  Medication Sig Dispense Refill   acyclovir (ZOVIRAX) 400 MG tablet Take 1 tablet (400 mg total) by mouth 3 (three) times daily as needed (Take for 5 days for an outbreak). 90 tablet 0   amLODipine (NORVASC) 10 MG tablet TAKE 1 TABLET BY MOUTH  DAILY 90 tablet 3   Calcium-Vitamin D-Vitamin K (CALCIUM + D + K PO) Take 1 capsule by mouth daily.     cyanocobalamin 2000 MCG tablet Take 2,000 mcg by mouth daily.     doxycycline (VIBRA-TABS) 100 MG tablet Take 1 tablet (100 mg total) by mouth 2 (two) times daily. For 10 days. Take with full glass of water, stay upright 30 min after taking. 20 tablet 0   FLUoxetine (PROZAC) 10 MG tablet Take 1 tablet (10 mg total) by mouth daily. 30 tablet 1   losartan (COZAAR) 25 MG tablet TAKE 1 TABLET BY MOUTH DAILY 90 tablet 0   meloxicam (MOBIC) 15 MG tablet Take 1 tablet by mouth once daily (Patient taking differently: Take 15 mg by mouth daily as needed.) 90 tablet 0   Multiple Vitamin (MULTIVITAMIN) tablet Take 1 tablet by mouth daily.     mupirocin ointment (BACTROBAN) 2 % Apply  1 application topically 2 (two) times daily as needed. For paronychia toe infection, as need. 22 g 0   Norethindrone Acetate-Ethinyl Estradiol (JUNEL 1.5/30) 1.5-30 MG-MCG tablet TAKE 1 TABLET BY MOUTH  DAILY FOR 3 WEEKS THEN OFF  FOR 1 WEEK 63 tablet 3   omeprazole (PRILOSEC) 20 MG capsule Take 1 capsule by mouth once daily 90 capsule 3   rosuvastatin (CRESTOR) 10 MG tablet TAKE 1 TABLET BY MOUTH  DAILY 90 tablet 2   triamcinolone cream (KENALOG) 0.5 % Apply 1 application topically 2 (two) times daily as needed. To affected areas, for up to 2 weeks. To toe for swelling inflammation 30 g 0   No current facility-administered medications for this visit.    Allergies  Allergen Reactions   Ranitidine Swelling    Face and lip swelling    Family History  Problem Relation Age of Onset   Arthritis Maternal Grandmother    Hypertension Maternal Grandmother    Breast cancer Maternal Grandmother 77   Colon cancer Neg Hx    Colon polyps Neg Hx    Rectal cancer Neg Hx    Stomach cancer Neg Hx    Esophageal cancer Neg Hx     Social History   Socioeconomic History  Marital status: Single    Spouse name: Not on file   Number of children: Not on file   Years of education: Not on file   Highest education level: Not on file  Occupational History   Not on file  Tobacco Use   Smoking status: Never   Smokeless tobacco: Never  Vaping Use   Vaping Use: Never used  Substance and Sexual Activity   Alcohol use: Yes    Alcohol/week: 0.0 standard drinks    Comment: occasional   Drug use: No   Sexual activity: Yes    Partners: Male    Birth control/protection: Pill  Other Topics Concern   Not on file  Social History Narrative   Not on file   Social Determinants of Health   Financial Resource Strain: Not on file  Food Insecurity: Not on file  Transportation Needs: Not on file  Physical Activity: Not on file  Stress: Not on file  Social Connections: Not on file  Intimate Partner  Violence: Not on file     Constitutional: Denies fever, malaise, fatigue, headache or abrupt weight changes.  Respiratory: Denies difficulty breathing, shortness of breath, cough or sputum production.   Cardiovascular: Denies chest pain, chest tightness, palpitations or swelling in the hands or feet.  Musculoskeletal: Patient reports right great toe pain and swelling.  Denies decrease in range of motion, difficulty with gait, muscle pain.  Skin: Patient reports pus around nailbed of right great toe. Denies rashes, lesions or ulcercations.    No other specific complaints in a complete review of systems (except as listed in HPI above).  Objective:   Physical Exam  BP (!) 162/86 (BP Location: Right Arm, Patient Position: Sitting, Cuff Size: Large)    Pulse 88    Temp (!) 97.3 F (36.3 C) (Temporal)    Wt (!) 324 lb (147 kg)    SpO2 100%    BMI 53.92 kg/m   Wt Readings from Last 3 Encounters:  09/27/21 (!) 323 lb 6.4 oz (146.7 kg)  08/26/21 (!) 327 lb 6.4 oz (148.5 kg)  03/04/21 298 lb 9.6 oz (135.4 kg)    General: Appears her stated age, obese, in NAD. Skin: Pus noted around the nailbed and underneath the nail of the right great toe.  Skin peeling Cardiovascular: Normal rate . Pulmonary/Chest: Normal effort. Musculoskeletal: Gait slow and steady without device. Neurological: Alert and oriented.    BMET    Component Value Date/Time   NA 138 08/26/2021 1618   NA 137 07/03/2013 2132   K 3.9 08/26/2021 1618   K 3.6 07/03/2013 2132   CL 103 08/26/2021 1618   CL 103 07/03/2013 2132   CO2 24 08/26/2021 1618   CO2 29 07/03/2013 2132   GLUCOSE 76 08/26/2021 1618   GLUCOSE 118 (H) 06/18/2018 1756   GLUCOSE 90 07/03/2013 2132   BUN 12 08/26/2021 1618   BUN 9 07/03/2013 2132   CREATININE 1.14 (H) 08/26/2021 1618   CREATININE 0.85 07/03/2013 2132   CALCIUM 8.5 (L) 08/26/2021 1618   CALCIUM 9.0 07/03/2013 2132   GFRNONAA 71 08/24/2020 1626   GFRNONAA >60 07/03/2013 2132   GFRAA  82 08/24/2020 1626   GFRAA >60 07/03/2013 2132    Lipid Panel     Component Value Date/Time   CHOL 179 08/26/2021 1618   TRIG 65 08/26/2021 1618   HDL 57 08/26/2021 1618   CHOLHDL 3.1 08/26/2021 1618   LDLCALC 110 (H) 08/26/2021 1618    CBC  Component Value Date/Time   WBC 6.3 08/26/2021 1618   WBC 7.7 06/18/2018 1756   RBC 4.98 08/26/2021 1618   RBC 6.05 (H) 06/18/2018 1756   HGB 12.1 08/26/2021 1618   HCT 36.5 08/26/2021 1618   PLT 275 08/26/2021 1618   MCV 73 (L) 08/26/2021 1618   MCV 73 (L) 07/03/2013 2132   MCH 24.3 (L) 08/26/2021 1618   MCH 23.0 (L) 06/18/2018 1756   MCHC 33.2 08/26/2021 1618   MCHC 33.1 06/18/2018 1756   RDW 19.7 (H) 08/26/2021 1618   RDW 19.1 (H) 07/03/2013 2132   LYMPHSABS 2.5 03/17/2015 1618   EOSABS 0.1 03/17/2015 1618   BASOSABS 0.0 03/17/2015 1618    Hgb A1C Lab Results  Component Value Date   HGBA1C 5.4 08/26/2021           Assessment & Plan:   Paronychia of Right Great Toenail:  Persistent Continue Epsom salt soaks Rx for Keflex 500 mg 3 times daily for 10 days Referral to podiatry placed for further evaluation and treatment Continue Mupirocin and Triamcinolone as previously prescribed by Dr. Parks Ranger Encourage patient  RTC in 2 weeks for repeat blood pressure check  Webb Silversmith, NP This visit occurred during the SARS-CoV-2 public health emergency.  Safety protocols were in place, including screening questions prior to the visit, additional usage of staff PPE, and extensive cleaning of exam room while observing appropriate contact time as indicated for disinfecting solutions. ]

## 2021-10-14 NOTE — Assessment & Plan Note (Signed)
Encourage diet and exercise for weight loss 

## 2021-10-18 ENCOUNTER — Ambulatory Visit: Payer: BC Managed Care – PPO | Admitting: Internal Medicine

## 2021-10-19 ENCOUNTER — Other Ambulatory Visit: Payer: Self-pay | Admitting: Internal Medicine

## 2021-10-20 NOTE — Telephone Encounter (Signed)
Requested medication (s) are due for refill today: Yes  Requested medication (s) are on the active medication list: Yes  Last refill:  09/14/20  Future visit scheduled: Yes  Notes to clinic:  Prescription expired. Needs labs.    Requested Prescriptions  Pending Prescriptions Disp Refills   rosuvastatin (CRESTOR) 10 MG tablet [Pharmacy Med Name: Rosuvastatin Calcium 10 MG Oral Tablet] 90 tablet 3    Sig: TAKE 1 TABLET BY MOUTH  DAILY     Cardiovascular:  Antilipid - Statins 2 Failed - 10/20/2021 11:37 AM      Failed - Cr in normal range and within 360 days    Creatinine  Date Value Ref Range Status  07/03/2013 0.85 0.60 - 1.30 mg/dL Final   Creatinine, Ser  Date Value Ref Range Status  08/26/2021 1.14 (H) 0.57 - 1.00 mg/dL Final          Failed - Lipid Panel in normal range within the last 12 months    Cholesterol, Total  Date Value Ref Range Status  08/26/2021 179 100 - 199 mg/dL Final   LDL Chol Calc (NIH)  Date Value Ref Range Status  08/26/2021 110 (H) 0 - 99 mg/dL Final   HDL  Date Value Ref Range Status  08/26/2021 57 >39 mg/dL Final   Triglycerides  Date Value Ref Range Status  08/26/2021 65 0 - 149 mg/dL Final         Passed - Patient is not pregnant      Passed - Valid encounter within last 12 months    Recent Outpatient Visits           6 days ago Paronychia of great toe of right foot   [place], Coralie Keens, NP   3 weeks ago Paronychia of great toe of right foot   [place], DO   1 month ago Encounter for general adult medical examination with abnormal findings   [facility] [place], Coralie Keens, NP   7 months ago Globus sensation   [facility] [place], Coralie Keens, NP       Future Appointments             In 3 weeks Garnette Gunner, Coralie Keens, NP [facility] [place], [facility]

## 2021-10-25 ENCOUNTER — Ambulatory Visit: Payer: Managed Care, Other (non HMO) | Admitting: Podiatry

## 2021-11-11 ENCOUNTER — Encounter: Payer: Self-pay | Admitting: Internal Medicine

## 2021-11-11 ENCOUNTER — Ambulatory Visit (INDEPENDENT_AMBULATORY_CARE_PROVIDER_SITE_OTHER): Payer: BC Managed Care – PPO | Admitting: Internal Medicine

## 2021-11-11 ENCOUNTER — Other Ambulatory Visit: Payer: Self-pay

## 2021-11-11 DIAGNOSIS — I10 Essential (primary) hypertension: Secondary | ICD-10-CM

## 2021-11-11 NOTE — Assessment & Plan Note (Signed)
Did not tolerate Phentermie ?Discussed oral med Contrave vs injectable like Saxenda/Wegovy ?She will consider her options and let me know in 2 weeks ?She has already cut back on carbs and refined sugars ?She plans to start exercising again in the next week ?

## 2021-11-11 NOTE — Assessment & Plan Note (Signed)
Uncontrolled ?Increase Losartan to 50 mg daily ?Continue Amlodipine as previously prescribed ?Reinforced DASH diet and exercise for weight loss ? ?RTC in 2 weeks for nurse visit BP check ?

## 2021-11-11 NOTE — Patient Instructions (Signed)

## 2021-11-11 NOTE — Progress Notes (Signed)
? ?Subjective:  ? ? Patient ID: Margaret Carlson, female    DOB: [birth year], 46 y.o.   MRN: [id number] ? ?HPI ? ?Patient presents to clinic today for 2-week follow-up of HTN.  At her last visit, her BP was elevated.  She felt like this was related to the pain she was in due to the infection in her toe.  She is taking her Amlodipine and Losartan as prescribed.  Her BP today is 154/86.  ECG from  02/2015 reviewed. ? ?She also wants to discuss her weight loss options.  Her weight today is 322 LBS with a BMI of 53.58.  She has taken Phentermine in the past and did not like the way it made her feel. ? ?Review of Systems ? ?   ?Past Medical History:  ?Diagnosis Date  ? Allergy   ? Anxiety   ? Hyperlipidemia   ? Hypertension   ? ? ?Current Outpatient Medications  ?Medication Sig Dispense Refill  ? acyclovir (ZOVIRAX) 400 MG tablet Take 1 tablet (400 mg total) by mouth 3 (three) times daily as needed (Take for 5 days for an outbreak). 90 tablet 0  ? amLODipine (NORVASC) 10 MG tablet TAKE 1 TABLET BY MOUTH  DAILY 90 tablet 3  ? Calcium-Vitamin D-Vitamin K (CALCIUM + D + K PO) Take 1 capsule by mouth daily.    ? cephALEXin (KEFLEX) 500 MG capsule Take 1 capsule (500 mg total) by mouth 3 (three) times daily. 30 capsule 0  ? cyanocobalamin 2000 MCG tablet Take 2,000 mcg by mouth daily.    ? FLUoxetine (PROZAC) 10 MG tablet Take 1 tablet (10 mg total) by mouth daily. 30 tablet 1  ? losartan (COZAAR) 25 MG tablet TAKE 1 TABLET BY MOUTH DAILY 90 tablet 0  ? meloxicam (MOBIC) 15 MG tablet Take 1 tablet by mouth once daily (Patient taking differently: Take 15 mg by mouth daily as needed.) 90 tablet 0  ? Multiple Vitamin (MULTIVITAMIN) tablet Take 1 tablet by mouth daily.    ? mupirocin ointment (BACTROBAN) 2 % Apply 1 application topically 2 (two) times daily as needed. For paronychia toe infection, as need. 22 g 0  ? Norethindrone Acetate-Ethinyl Estradiol (JUNEL 1.5/30) 1.5-30 MG-MCG tablet TAKE 1 TABLET BY MOUTH  DAILY FOR 3 WEEKS  THEN OFF  FOR 1 WEEK 63 tablet 3  ? omeprazole (PRILOSEC) 20 MG capsule Take 1 capsule by mouth once daily 90 capsule 3  ? rosuvastatin (CRESTOR) 10 MG tablet TAKE 1 TABLET BY MOUTH  DAILY 90 tablet 3  ? triamcinolone cream (KENALOG) 0.5 % Apply 1 application topically 2 (two) times daily as needed. To affected areas, for up to 2 weeks. To toe for swelling inflammation 30 g 0  ? ?No current facility-administered medications for this visit.  ? ? ?Allergies  ?Allergen Reactions  ? Ranitidine Swelling  ?  Face and lip swelling  ? ? ?Family History  ?Problem Relation Age of Onset  ? Arthritis Maternal Grandmother   ? Hypertension Maternal Grandmother   ? Breast cancer Maternal Grandmother 89  ? Colon cancer Neg Hx   ? Colon polyps Neg Hx   ? Rectal cancer Neg Hx   ? Stomach cancer Neg Hx   ? Esophageal cancer Neg Hx   ? ? ?Social History  ? ?Socioeconomic History  ? Marital status: Single  ?  Spouse name: Not on file  ? Number of children: Not on file  ? Years of education: Not on file  ?  Highest education level: Not on file  ?Occupational History  ? Not on file  ?Tobacco Use  ? Smoking status: Never  ? Smokeless tobacco: Never  ?Vaping Use  ? Vaping Use: Never used  ?Substance and Sexual Activity  ? Alcohol use: Yes  ?  Alcohol/week: 0.0 standard drinks  ?  Comment: occasional  ? Drug use: No  ? Sexual activity: Yes  ?  Partners: Male  ?  Birth control/protection: Pill  ?Other Topics Concern  ? Not on file  ?Social History Narrative  ? Not on file  ? ?Social Determinants of Health  ? ?Financial Resource Strain: Not on file  ?Food Insecurity: Not on file  ?Transportation Needs: Not on file  ?Physical Activity: Not on file  ?Stress: Not on file  ?Social Connections: Not on file  ?Intimate Partner Violence: Not on file  ? ? ? ?Constitutional: Denies fever, malaise, fatigue, headache or abrupt weight changes.  ?Respiratory: Denies difficulty breathing, shortness of breath, cough or sputum production.   ?Cardiovascular:  Denies chest pain, chest tightness, palpitations or swelling in the hands or feet.  ?Neurological: Denies dizziness, difficulty with memory, difficulty with speech or problems with balance and coordination.  ? ? ?No other specific complaints in a complete review of systems (except as listed in HPI above). ? ?Objective:  ? Physical Exam ? ? ?BP (!) 154/86 (BP Location: Left Arm, Patient Position: Sitting, Cuff Size: Large)   Pulse 85   Temp (!) 97.3 ?F (36.3 ?C) (Temporal)   Wt (!) 322 lb (146.1 kg)   SpO2 100%   BMI 53.58 kg/m?  ? ?Wt Readings from Last 3 Encounters:  ?10/14/21 (!) 324 lb (147 kg)  ?09/27/21 (!) 323 lb 6.4 oz (146.7 kg)  ?08/26/21 (!) 327 lb 6.4 oz (148.5 kg)  ? ? ?General: Appears her stated age, obese, in NAD. ?Cardiovascular: Normal rate and rhythm.  ?Pulmonary/Chest: Normal effort. ?Neurological: Alert and oriented. Coordination normal.  ? ?BMET ?   ?Component Value Date/Time  ? NA 138 08/26/2021 1618  ? NA 137 07/03/2013 2132  ? K 3.9 08/26/2021 1618  ? K 3.6 07/03/2013 2132  ? CL 103 08/26/2021 1618  ? CL 103 07/03/2013 2132  ? CO2 24 08/26/2021 1618  ? CO2 29 07/03/2013 2132  ? GLUCOSE 76 08/26/2021 1618  ? GLUCOSE 118 (H) 06/18/2018 1756  ? GLUCOSE 90 07/03/2013 2132  ? BUN 12 08/26/2021 1618  ? BUN 9 07/03/2013 2132  ? CREATININE 1.14 (H) 08/26/2021 1618  ? CREATININE 0.85 07/03/2013 2132  ? CALCIUM 8.5 (L) 08/26/2021 1618  ? CALCIUM 9.0 07/03/2013 2132  ? GFRNONAA 71 08/24/2020 1626  ? GFRNONAA >60 07/03/2013 2132  ? GFRAA 82 08/24/2020 1626  ? GFRAA >60 07/03/2013 2132  ? ? ?Lipid Panel  ?   ?Component Value Date/Time  ? CHOL 179 08/26/2021 1618  ? TRIG 65 08/26/2021 1618  ? HDL 57 08/26/2021 1618  ? CHOLHDL 3.1 08/26/2021 1618  ? LDLCALC 110 (H) 08/26/2021 1618  ? ? ?CBC ?   ?Component Value Date/Time  ? WBC 6.3 08/26/2021 1618  ? WBC 7.7 06/18/2018 1756  ? RBC 4.98 08/26/2021 1618  ? RBC 6.05 (H) 06/18/2018 1756  ? HGB 12.1 08/26/2021 1618  ? HCT 36.5 08/26/2021 1618  ? PLT 275  08/26/2021 1618  ? MCV 73 (L) 08/26/2021 1618  ? MCV 73 (L) 07/03/2013 2132  ? MCH 24.3 (L) 08/26/2021 1618  ? MCH 23.0 (L) 06/18/2018 1756  ?  MCHC 33.2 08/26/2021 1618  ? MCHC 33.1 06/18/2018 1756  ? RDW 19.7 (H) 08/26/2021 1618  ? RDW 19.1 (H) 07/03/2013 2132  ? LYMPHSABS 2.5 03/17/2015 1618  ? EOSABS 0.1 03/17/2015 1618  ? BASOSABS 0.0 03/17/2015 1618  ? ? ?Hgb A1C ?Lab Results  ?Component Value Date  ? HGBA1C 5.4 08/26/2021  ? ? ? ? ? ?   ?Assessment & Plan:  ? ? ? ?Webb Silversmith, NP ?This visit occurred during the SARS-CoV-2 public health emergency.  Safety protocols were in place, including screening questions prior to the visit, additional usage of staff PPE, and extensive cleaning of exam room while observing appropriate contact time as indicated for disinfecting solutions.  ? ?

## 2021-11-12 ENCOUNTER — Other Ambulatory Visit: Payer: Self-pay | Admitting: Family

## 2021-11-17 DIAGNOSIS — M9906 Segmental and somatic dysfunction of lower extremity: Secondary | ICD-10-CM | POA: Diagnosis not present

## 2021-11-17 DIAGNOSIS — M62830 Muscle spasm of back: Secondary | ICD-10-CM | POA: Diagnosis not present

## 2021-11-17 DIAGNOSIS — M9902 Segmental and somatic dysfunction of thoracic region: Secondary | ICD-10-CM | POA: Diagnosis not present

## 2021-11-17 DIAGNOSIS — R293 Abnormal posture: Secondary | ICD-10-CM | POA: Diagnosis not present

## 2021-11-17 DIAGNOSIS — M9903 Segmental and somatic dysfunction of lumbar region: Secondary | ICD-10-CM | POA: Diagnosis not present

## 2021-11-17 DIAGNOSIS — M62452 Contracture of muscle, left thigh: Secondary | ICD-10-CM | POA: Diagnosis not present

## 2021-11-24 DIAGNOSIS — M9902 Segmental and somatic dysfunction of thoracic region: Secondary | ICD-10-CM | POA: Diagnosis not present

## 2021-11-24 DIAGNOSIS — R293 Abnormal posture: Secondary | ICD-10-CM | POA: Diagnosis not present

## 2021-11-24 DIAGNOSIS — M62452 Contracture of muscle, left thigh: Secondary | ICD-10-CM | POA: Diagnosis not present

## 2021-11-24 DIAGNOSIS — M9903 Segmental and somatic dysfunction of lumbar region: Secondary | ICD-10-CM | POA: Diagnosis not present

## 2021-11-24 DIAGNOSIS — M62830 Muscle spasm of back: Secondary | ICD-10-CM | POA: Diagnosis not present

## 2021-11-24 DIAGNOSIS — M9906 Segmental and somatic dysfunction of lower extremity: Secondary | ICD-10-CM | POA: Diagnosis not present

## 2021-11-25 ENCOUNTER — Ambulatory Visit: Payer: BC Managed Care – PPO

## 2021-11-25 VITALS — BP 154/92

## 2021-11-25 DIAGNOSIS — I10 Essential (primary) hypertension: Secondary | ICD-10-CM

## 2021-11-25 NOTE — Progress Notes (Signed)
Hypertension, follow-up ? ?BP Readings from Last 3 Encounters:  ?11/25/21 (!) 154/92  ?11/11/21 (!) 154/86  ?10/14/21 (!) 162/86  ? Wt Readings from Last 3 Encounters:  ?11/11/21 (!) 322 lb (146.1 kg)  ?10/14/21 (!) 324 lb (147 kg)  ?09/27/21 (!) 323 lb 6.4 oz (146.7 kg)  ?  ? ?She was last seen for hypertension 2 weeks ago.  ?BP at that visit was 154/86. ?Management since that visit includes increasing losartan to 70m daily. ? ?She reports excellent compliance with treatment. ?She is not having side effects.  ? ? ?Outside blood pressures are not being checked.  Pt's blood pressure in the office today is 154/92 Left arm and 146/88 Right arm.  ? ? ?Pertinent labs ?Lab Results  ?Component Value Date  ? CHOL 179 08/26/2021  ? HDL 57 08/26/2021  ? LDLCALC 110 (H) 08/26/2021  ? TRIG 65 08/26/2021  ? CHOLHDL 3.1 08/26/2021  ? Lab Results  ?Component Value Date  ? NA 138 08/26/2021  ? K 3.9 08/26/2021  ? CREATININE 1.14 (H) 08/26/2021  ? EGFR 60 08/26/2021  ? GLUCOSE 76 08/26/2021  ? TSH 1.270 08/26/2021  ?  ? ?The 10-year ASCVD risk score (Arnett DK, et al., 2019) is: 4.7% ? ?--------------------------------------------------------------------------------------------------- ? ? ? ? ?

## 2021-11-25 NOTE — Progress Notes (Signed)
Hypertension, follow-up ? ?BP Readings from Last 3 Encounters:  ?11/11/21 (!) 154/86  ?10/14/21 (!) 162/86  ?09/27/21 (!) 144/84  ? Wt Readings from Last 3 Encounters:  ?11/11/21 (!) 322 lb (146.1 kg)  ?10/14/21 (!) 324 lb (147 kg)  ?09/27/21 (!) 323 lb 6.4 oz (146.7 kg)  ?  ? ?She was last seen for hypertension 2 weeks ago.  ?BP at that visit was 154/86. ?Management since that visit includes increasing losartan to 26m daily. ? ?She reports excellent compliance with treatment. ?She is not having side effects.  ? ? ?Outside blood pressures are not being checked.  Pt's blood pressure in the office today is 154/92 Left arm and 146/88 Right arm.  ? ? ?Pertinent labs ?Lab Results  ?Component Value Date  ? CHOL 179 08/26/2021  ? HDL 57 08/26/2021  ? LDLCALC 110 (H) 08/26/2021  ? TRIG 65 08/26/2021  ? CHOLHDL 3.1 08/26/2021  ? Lab Results  ?Component Value Date  ? NA 138 08/26/2021  ? K 3.9 08/26/2021  ? CREATININE 1.14 (H) 08/26/2021  ? EGFR 60 08/26/2021  ? GLUCOSE 76 08/26/2021  ? TSH 1.270 08/26/2021  ?  ? ?The 10-year ASCVD risk score (Arnett DK, et al., 2019) is: 4.7% ? ?--------------------------------------------------------------------------------------------------- ? ? ? ? ?

## 2021-12-01 DIAGNOSIS — M62830 Muscle spasm of back: Secondary | ICD-10-CM | POA: Diagnosis not present

## 2021-12-01 DIAGNOSIS — M62452 Contracture of muscle, left thigh: Secondary | ICD-10-CM | POA: Diagnosis not present

## 2021-12-01 DIAGNOSIS — M9903 Segmental and somatic dysfunction of lumbar region: Secondary | ICD-10-CM | POA: Diagnosis not present

## 2021-12-01 DIAGNOSIS — R293 Abnormal posture: Secondary | ICD-10-CM | POA: Diagnosis not present

## 2021-12-01 DIAGNOSIS — M9902 Segmental and somatic dysfunction of thoracic region: Secondary | ICD-10-CM | POA: Diagnosis not present

## 2021-12-01 DIAGNOSIS — M9906 Segmental and somatic dysfunction of lower extremity: Secondary | ICD-10-CM | POA: Diagnosis not present

## 2021-12-03 ENCOUNTER — Encounter: Payer: Self-pay | Admitting: Internal Medicine

## 2021-12-06 MED ORDER — LOSARTAN POTASSIUM-HCTZ 100-12.5 MG PO TABS: 1.0000 | ORAL_TABLET | Freq: Every day | ORAL | 0 refills | Status: DC

## 2021-12-10 ENCOUNTER — Other Ambulatory Visit: Payer: Self-pay | Admitting: Internal Medicine

## 2021-12-10 NOTE — Telephone Encounter (Signed)
Requested Prescriptions  ?Pending Prescriptions Disp Refills  ?? FLUoxetine (PROZAC) 10 MG tablet [Pharmacy Med Name: FLUoxetine HCl 10 MG Oral Tablet] 30 tablet 0  ?  Sig: Take 1 tablet by mouth once daily  ?  ? Psychiatry:  Antidepressants - SSRI Passed - 12/10/2021  7:22 AM  ?  ?  Passed - Completed PHQ-2 or PHQ-9 in the last 360 days  ?  ?  Passed - Valid encounter within last 6 months  ?  Recent Outpatient Visits   ?      ? 4 weeks ago Primary hypertension  ? [facility] [place], [place], NP  ? 1 month ago Paronychia of great toe of right foot  ? [facility] [place], [place], NP  ? 2 months ago Paronychia of great toe of right foot  ? [place], DO  ? 3 months ago Encounter for general adult medical examination with abnormal findings  ? [facility] [place], [place] W, NP  ? 9 months ago Globus sensation  ? [facility] [place], Coralie Keens, NP  ?  ?  ? ?  ?  ?  ? ?

## 2021-12-15 DIAGNOSIS — M62830 Muscle spasm of back: Secondary | ICD-10-CM | POA: Diagnosis not present

## 2021-12-15 DIAGNOSIS — M9903 Segmental and somatic dysfunction of lumbar region: Secondary | ICD-10-CM | POA: Diagnosis not present

## 2021-12-15 DIAGNOSIS — M62452 Contracture of muscle, left thigh: Secondary | ICD-10-CM | POA: Diagnosis not present

## 2021-12-15 DIAGNOSIS — M9902 Segmental and somatic dysfunction of thoracic region: Secondary | ICD-10-CM | POA: Diagnosis not present

## 2021-12-15 DIAGNOSIS — M9906 Segmental and somatic dysfunction of lower extremity: Secondary | ICD-10-CM | POA: Diagnosis not present

## 2021-12-15 DIAGNOSIS — R293 Abnormal posture: Secondary | ICD-10-CM | POA: Diagnosis not present

## 2021-12-19 ENCOUNTER — Other Ambulatory Visit: Payer: Self-pay | Admitting: Internal Medicine

## 2021-12-21 NOTE — Telephone Encounter (Signed)
rx was changed to losartan-hctz on 12/06/21 ?Requested Prescriptions  ?Pending Prescriptions Disp Refills  ?? losartan (COZAAR) 25 MG tablet [Pharmacy Med Name: Losartan Potassium 25 MG Oral Tablet] 90 tablet 3  ?  Sig: TAKE 1 TABLET BY MOUTH DAILY  ?  ? Cardiovascular:  Angiotensin Receptor Blockers Failed - 12/19/2021 11:17 PM  ?  ?  Failed - Cr in normal range and within 180 days  ?  Creatinine  ?Date Value Ref Range Status  ?07/03/2013 0.85 0.60 - 1.30 mg/dL Final  ? ?Creatinine, Ser  ?Date Value Ref Range Status  ?08/26/2021 1.14 (H) 0.57 - 1.00 mg/dL Final  ?   ?  ?  Failed - Last BP in normal range  ?  BP Readings from Last 1 Encounters:  ?11/25/21 (!) 154/92  ?   ?  ?  Passed - K in normal range and within 180 days  ?  Potassium  ?Date Value Ref Range Status  ?08/26/2021 3.9 3.5 - 5.2 mmol/L Final  ?07/03/2013 3.6 3.5 - 5.1 mmol/L Final  ?   ?  ?  Passed - Patient is not pregnant  ?  ?  Passed - Valid encounter within last 6 months  ?  Recent Outpatient Visits   ?      ? 1 month ago Primary hypertension  ? [facility] [place], [place] W, NP  ? 2 months ago Paronychia of great toe of right foot  ? [facility] [place], [place] W, NP  ? 2 months ago Paronychia of great toe of right foot  ? [place], DO  ? 3 months ago Encounter for general adult medical examination with abnormal findings  ? [facility] [place], [place] W, NP  ? 9 months ago Globus sensation  ? [facility] [place], Coralie Keens, NP  ?  ?  ?Future Appointments   ?        ? In 1 week Baity, Coralie Keens, NP [facility], [place]  ?  ? ?  ?  ?  ? ? ?

## 2021-12-28 ENCOUNTER — Ambulatory Visit (INDEPENDENT_AMBULATORY_CARE_PROVIDER_SITE_OTHER): Payer: BC Managed Care – PPO | Admitting: Internal Medicine

## 2021-12-28 ENCOUNTER — Other Ambulatory Visit: Payer: Self-pay | Admitting: Internal Medicine

## 2021-12-28 ENCOUNTER — Encounter: Payer: Self-pay | Admitting: Internal Medicine

## 2021-12-28 VITALS — BP 156/98 | HR 74 | Temp 97.5°F | Wt 323.0 lb

## 2021-12-28 DIAGNOSIS — G4700 Insomnia, unspecified: Secondary | ICD-10-CM | POA: Insufficient documentation

## 2021-12-28 DIAGNOSIS — F32A Depression, unspecified: Secondary | ICD-10-CM

## 2021-12-28 DIAGNOSIS — E782 Mixed hyperlipidemia: Secondary | ICD-10-CM

## 2021-12-28 DIAGNOSIS — F419 Anxiety disorder, unspecified: Secondary | ICD-10-CM

## 2021-12-28 DIAGNOSIS — I10 Essential (primary) hypertension: Secondary | ICD-10-CM | POA: Diagnosis not present

## 2021-12-28 DIAGNOSIS — F5101 Primary insomnia: Secondary | ICD-10-CM

## 2021-12-28 DIAGNOSIS — K219 Gastro-esophageal reflux disease without esophagitis: Secondary | ICD-10-CM | POA: Diagnosis not present

## 2021-12-28 DIAGNOSIS — A6004 Herpesviral vulvovaginitis: Secondary | ICD-10-CM

## 2021-12-28 MED ORDER — LABETALOL HCL 100 MG PO TABS: 100.0000 mg | ORAL_TABLET | Freq: Two times a day (BID) | ORAL | 0 refills | Status: DC

## 2021-12-28 NOTE — Assessment & Plan Note (Signed)
Stable on her current dose of Fluoxetine ?Support offered ?

## 2021-12-28 NOTE — Progress Notes (Signed)
? ?Subjective:  ? ? Patient ID: Margaret Carlson, female    DOB: Dec 01, 1975, 46 y.o.   MRN: [id number] ? ?HPI ? ?Patient presents to clinic today for follow-up of chronic conditions. ? ?HTN: Her BP today is 156/98.  She is taking Losartan HCT and Amlodipine as prescribed.  ECG from 02/2015 reviewed. ? ?HLD: Her last LDL was 110, triglycerides 65, 07/2021.  She denies myalgias on Rosuvastatin.  She tries to consume a low-fat diet. ? ?Anxiety and Depression: Chronic, managed on Fluoxetine.  She is not currently seeing a therapist.  She denies SI/HI. ? 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  She has a Rx for Valacyclovir to take as needed for outbreaks. ? ?GERD: She is not sure what triggers this.  She denies breakthrough on Omeprazole.  There is no upper GI on file. ? ?Insomnia: She is able to fall asleep but is not able to stay asleep. She does not snore. She takes Zzyquil as needed. There is no sleep study file. ? ?Review of Systems ? ?   ?Past Medical History:  ?Diagnosis Date  ? Allergy   ? Anxiety   ? Hyperlipidemia   ? Hypertension   ? ? ?Current Outpatient Medications  ?Medication Sig Dispense Refill  ? acyclovir (ZOVIRAX) 400 MG tablet Take 1 tablet (400 mg total) by mouth 3 (three) times daily as needed (Take for 5 days for an outbreak). 90 tablet 0  ? amLODipine (NORVASC) 10 MG tablet TAKE 1 TABLET BY MOUTH  DAILY 90 tablet 3  ? Calcium-Vitamin D-Vitamin K (CALCIUM + D + K PO) Take 1 capsule by mouth daily.    ? cyanocobalamin 2000 MCG tablet Take 2,000 mcg by mouth daily.    ? FLUoxetine (PROZAC) 10 MG tablet Take 1 tablet by mouth once daily 30 tablet 0  ? losartan-hydrochlorothiazide (HYZAAR) 100-12.5 MG tablet Take 1 tablet by mouth daily. 30 tablet 0  ? meloxicam (MOBIC) 15 MG tablet Take 1 tablet by mouth once daily (Patient taking differently: Take 15 mg by mouth daily as needed.) 90 tablet 0  ? Multiple Vitamin (MULTIVITAMIN) tablet Take 1 tablet by mouth daily.    ? Norethindrone Acetate-Ethinyl  Estradiol (JUNEL 1.5/30) 1.5-30 MG-MCG tablet TAKE 1 TABLET BY MOUTH  DAILY FOR 3 WEEKS THEN OFF  FOR 1 WEEK 63 tablet 3  ? omeprazole (PRILOSEC) 20 MG capsule Take 1 capsule by mouth once daily 90 capsule 3  ? rosuvastatin (CRESTOR) 10 MG tablet TAKE 1 TABLET BY MOUTH  DAILY 90 tablet 3  ? triamcinolone cream (KENALOG) 0.5 % Apply 1 application topically 2 (two) times daily as needed. To affected areas, for up to 2 weeks. To toe for swelling inflammation 30 g 0  ? ?No current facility-administered medications for this visit.  ? ? ?Allergies  ?Allergen Reactions  ? Ranitidine Swelling  ?  Face and lip swelling  ? ? ?Family History  ?Problem Relation Age of Onset  ? Arthritis Maternal Grandmother   ? Hypertension Maternal Grandmother   ? Breast cancer Maternal Grandmother 36  ? Colon cancer Neg Hx   ? Colon polyps Neg Hx   ? Rectal cancer Neg Hx   ? Stomach cancer Neg Hx   ? Esophageal cancer Neg Hx   ? ? ?Social History  ? ?Socioeconomic History  ? Marital status: Single  ?  Spouse name: Not on file  ? Number of children: Not on file  ? Years of education: Not on file  ?  Highest education level: Not on file  ?Occupational History  ? Not on file  ?Tobacco Use  ? Smoking status: Never  ? Smokeless tobacco: Never  ?Vaping Use  ? Vaping Use: Never used  ?Substance and Sexual Activity  ? Alcohol use: Yes  ?  Alcohol/week: 0.0 standard drinks  ?  Comment: occasional  ? Drug use: No  ? Sexual activity: Yes  ?  Partners: Male  ?  Birth control/protection: Pill  ?Other Topics Concern  ? Not on file  ?Social History Narrative  ? Not on file  ? ?Social Determinants of Health  ? ?Financial Resource Strain: Not on file  ?Food Insecurity: Not on file  ?Transportation Needs: Not on file  ?Physical Activity: Not on file  ?Stress: Not on file  ?Social Connections: Not on file  ?Intimate Partner Violence: Not on file  ? ? ? ?Constitutional: Denies fever, malaise, fatigue, headache or abrupt weight changes.  ?Respiratory: Denies  difficulty breathing, shortness of breath, cough or sputum production.   ?Cardiovascular: Denies chest pain, chest tightness, palpitations or swelling in the hands or feet.  ?Musculoskeletal: Denies decrease in range of motion, difficulty with gait, muscle pain or joint pain and swelling.  ?Skin: Denies redness, rashes, lesions or ulcercations.  ?Neurological: Denies dizziness, difficulty with memory, difficulty with speech or problems with balance and coordination.  ?Psych: Patient has a history of anxiety and depression.  Denies  SI/HI. ? ?No other specific complaints in a complete review of systems (except as listed in HPI above). ? ?Objective:  ? Physical Exam ? ?BP (!) 156/98 (BP Location: Right Arm, Patient Position: Sitting, Cuff Size: Large)   Pulse 74   Temp (!) 97.5 ?F (36.4 ?C) (Temporal)   Wt (!) 323 lb (146.5 kg)   SpO2 99%   BMI 53.75 kg/m?  ? ?Wt Readings from Last 3 Encounters:  ?11/11/21 (!) 322 lb (146.1 kg)  ?10/14/21 (!) 324 lb (147 kg)  ?09/27/21 (!) 323 lb 6.4 oz (146.7 kg)  ? ? ?General: Appears her stated age, obese, in NAD. ?Skin: Warm, dry and intact.  ?HEENT: Head: normal shape and size; Eyes: sclera white, no icterus, conjunctiva pink, PERRLA and EOMs intact; ?Cardiovascular: Normal rate and rhythm. S1,S2 noted.  No murmur, rubs or gallops noted. No JVD or BLE edema. No carotid bruits noted. ?Pulmonary/Chest: Normal effort and positive vesicular breath sounds. No respiratory distress. No wheezes, rales or ronchi noted.  ?Musculoskeletal: No difficulty with gait.  ?Neurological: Alert and oriented.  ?Psychiatric: Mood and affect normal. Behavior is normal. Judgment and thought content normal.  ? ? ? ?BMET ?   ?Component Value Date/Time  ? NA 138 08/26/2021 1618  ? NA 137 07/03/2013 2132  ? K 3.9 08/26/2021 1618  ? K 3.6 07/03/2013 2132  ? CL 103 08/26/2021 1618  ? CL 103 07/03/2013 2132  ? CO2 24 08/26/2021 1618  ? CO2 29 07/03/2013 2132  ? GLUCOSE 76 08/26/2021 1618  ? GLUCOSE 118  (H) 06/18/2018 1756  ? GLUCOSE 90 07/03/2013 2132  ? BUN 12 08/26/2021 1618  ? BUN 9 07/03/2013 2132  ? CREATININE 1.14 (H) 08/26/2021 1618  ? CREATININE 0.85 07/03/2013 2132  ? CALCIUM 8.5 (L) 08/26/2021 1618  ? CALCIUM 9.0 07/03/2013 2132  ? GFRNONAA 71 08/24/2020 1626  ? GFRNONAA >60 07/03/2013 2132  ? GFRAA 82 08/24/2020 1626  ? GFRAA >60 07/03/2013 2132  ? ? ?Lipid Panel  ?   ?Component Value Date/Time  ? CHOL 179  08/26/2021 1618  ? TRIG 65 08/26/2021 1618  ? HDL 57 08/26/2021 1618  ? CHOLHDL 3.1 08/26/2021 1618  ? LDLCALC 110 (H) 08/26/2021 1618  ? ? ?CBC ?   ?Component Value Date/Time  ? WBC 6.3 08/26/2021 1618  ? WBC 7.7 06/18/2018 1756  ? RBC 4.98 08/26/2021 1618  ? RBC 6.05 (H) 06/18/2018 1756  ? HGB 12.1 08/26/2021 1618  ? HCT 36.5 08/26/2021 1618  ? PLT 275 08/26/2021 1618  ? MCV 73 (L) 08/26/2021 1618  ? MCV 73 (L) 07/03/2013 2132  ? MCH 24.3 (L) 08/26/2021 1618  ? MCH 23.0 (L) 06/18/2018 1756  ? MCHC 33.2 08/26/2021 1618  ? MCHC 33.1 06/18/2018 1756  ? RDW 19.7 (H) 08/26/2021 1618  ? RDW 19.1 (H) 07/03/2013 2132  ? LYMPHSABS 2.5 03/17/2015 1618  ? EOSABS 0.1 03/17/2015 1618  ? BASOSABS 0.0 03/17/2015 1618  ? ? ?Hgb A1C ?Lab Results  ?Component Value Date  ? HGBA1C 5.4 08/26/2021  ? ? ? ? ? ?   ?Assessment & Plan:  ? ? ? ?Webb Silversmith, NP ? ?

## 2021-12-28 NOTE — Assessment & Plan Note (Signed)
Encourage weight loss as this can help reduce reflux symptoms ?Continue Omeprazole ?

## 2021-12-28 NOTE — Patient Instructions (Signed)
Hypertension, Adult ?Hypertension is another name for high blood pressure. High blood pressure forces your heart to work harder to pump blood. This can cause problems over time. ?There are two numbers in a blood pressure reading. There is a top number (systolic) over a bottom number (diastolic). It is best to have a blood pressure that is below 120/80. ?What are the causes? ?The cause of this condition is not known. Some other conditions can lead to high blood pressure. ?What increases the risk? ?Some lifestyle factors can make you more likely to develop high blood pressure: ?Smoking. ?Not getting enough exercise or physical activity. ?Being overweight. ?Having too much fat, sugar, calories, or salt (sodium) in your diet. ?Drinking too much alcohol. ?Other risk factors include: ?Having any of these conditions: ?Heart disease. ?Diabetes. ?High cholesterol. ?Kidney disease. ?Obstructive sleep apnea. ?Having a family history of high blood pressure and high cholesterol. ?Age. The risk increases with age. ?Stress. ?What are the signs or symptoms? ?High blood pressure may not cause symptoms. Very high blood pressure (hypertensive crisis) may cause: ?Headache. ?Fast or uneven heartbeats (palpitations). ?Shortness of breath. ?Nosebleed. ?Vomiting or feeling like you may vomit (nauseous). ?Changes in how you see. ?Very bad chest pain. ?Feeling dizzy. ?Seizures. ?How is this treated? ?This condition is treated by making healthy lifestyle changes, such as: ?Eating healthy foods. ?Exercising more. ?Drinking less alcohol. ?Your doctor may prescribe medicine if lifestyle changes do not help enough and if: ?Your top number is above 130. ?Your bottom number is above 80. ?Your personal target blood pressure may vary. ?Follow these instructions at home: ?Eating and drinking ? ?If told, follow the DASH eating plan. To follow this plan: ?Fill one half of your plate at each meal with fruits and vegetables. ?Fill one fourth of your plate  at each meal with whole grains. Whole grains include whole-wheat pasta, brown rice, and whole-grain bread. ?Eat or drink low-fat dairy products, such as skim milk or low-fat yogurt. ?Fill one fourth of your plate at each meal with low-fat (lean) proteins. Low-fat proteins include fish, chicken without skin, eggs, beans, and tofu. ?Avoid fatty meat, cured and processed meat, or chicken with skin. ?Avoid pre-made or processed food. ?Limit the amount of salt in your diet to less than 1,500 mg each day. ?Do not drink alcohol if: ?Your doctor tells you not to drink. ?You are pregnant, may be pregnant, or are planning to become pregnant. ?If you drink alcohol: ?Limit how much you have to: ?0-1 drink a day for women. ?0-2 drinks a day for men. ?Know how much alcohol is in your drink. In the [place] one drink equals one 12 oz bottle of beer (355 mL), one 5 oz glass of wine (148 mL), or one 1? oz glass of hard liquor (44 mL). ?Lifestyle ? ?Work with your doctor to stay at a healthy weight or to lose weight. Ask your doctor what the best weight is for you. ?Get at least 30 minutes of exercise that causes your heart to beat faster (aerobic exercise) most days of the week. This may include walking, swimming, or biking. ?Get at least 30 minutes of exercise that strengthens your muscles (resistance exercise) at least 3 days a week. This may include lifting weights or doing Pilates. ?Do not smoke or use any products that contain nicotine or tobacco. If you need help quitting, ask your doctor. ?Check your blood pressure at home as told by your doctor. ?Keep all follow-up visits. ?Medicines ?Take over-the-counter and prescription medicines   only as told by your doctor. Follow directions carefully. ?Do not skip doses of blood pressure medicine. The medicine does not work as well if you skip doses. Skipping doses also puts you at risk for problems. ?Ask your doctor about side effects or reactions to medicines that you should watch  for. ?Contact a doctor if: ?You think you are having a reaction to the medicine you are taking. ?You have headaches that keep coming back. ?You feel dizzy. ?You have swelling in your ankles. ?You have trouble with your vision. ?Get help right away if: ?You get a very bad headache. ?You start to feel mixed up (confused). ?You feel weak or numb. ?You feel faint. ?You have very bad pain in your: ?Chest. ?Belly (abdomen). ?You vomit more than once. ?You have trouble breathing. ?These symptoms may be an emergency. Get help right away. Call 911. ?Do not wait to see if the symptoms will go away. ?Do not drive yourself to the hospital. ?Summary ?Hypertension is another name for high blood pressure. ?High blood pressure forces your heart to work harder to pump blood. ?For most people, a normal blood pressure is less than 120/80. ?Making healthy choices can help lower blood pressure. If your blood pressure does not get lower with healthy choices, you may need to take medicine. ?This information is not intended to replace advice given to you by your health care provider. Make sure you discuss any questions you have with your health care provider. ?Document Revised: 06/03/2021 Document Reviewed: 06/03/2021 ?Elsevier Patient Education ? 2023 [employer] ? ?

## 2021-12-28 NOTE — Assessment & Plan Note (Signed)
Encouraged her to consume low-fat diet ?Continue Rosuvastatin ?

## 2021-12-28 NOTE — Assessment & Plan Note (Signed)
Continue Valacyclovir as needed ?

## 2021-12-28 NOTE — Assessment & Plan Note (Signed)
Discussed treatment with use of Saxenda versus Wegovy but she declines given side effect profile ?Encourage low-carb diet and exercise for weight loss ?

## 2021-12-28 NOTE — Assessment & Plan Note (Signed)
Referral to pulmonology to rule out sleep apnea ?

## 2021-12-28 NOTE — Assessment & Plan Note (Signed)
Remains uncontrolled ?Continue losartan HCT and amlodipine ?We will add Labetalol 100 mg twice daily ?Referral to pulmonology for sleep study ?Reinforced DASH diet and exercise weight loss ?Could consider renal artery ultrasound if blood pressure remains elevated ? ?RTC in 2 weeks for BP check ?

## 2021-12-29 MED ORDER — LOSARTAN POTASSIUM-HCTZ 100-12.5 MG PO TABS: 1.0000 | ORAL_TABLET | Freq: Every day | ORAL | 0 refills | Status: DC

## 2021-12-29 NOTE — Addendum Note (Signed)
Addended by: Jearld Fenton on: 12/29/2021 10:59 AM ? ? Modules accepted: Orders ? ?

## 2021-12-29 NOTE — Telephone Encounter (Signed)
Requested medication (s) are due for refill today:   Yes ? ?Requested medication (s) are on the active medication list:   Yes ? ?Future visit scheduled:   Yes   Seen yesterday.  Has another appt 5/16 ? ? ?Last ordered: 12/28/2021 #60, 0 refills ? ?Returned because a 90 day supply is being requested.  ? ?Requested Prescriptions  ?Pending Prescriptions Disp Refills  ? labetalol (NORMODYNE) 100 MG tablet [Pharmacy Med Name: LABETALOL '100MG'$  TABLETS] 180 tablet   ?  Sig: TAKE 1 TABLET(100 MG) BY MOUTH TWICE DAILY  ?  ? Cardiovascular:  Beta Blockers Failed - 12/28/2021  2:17 PM  ?  ?  Failed - Last BP in normal range  ?  BP Readings from Last 1 Encounters:  ?12/28/21 (!) 156/98  ?  ?  ?  ?  Passed - Last Heart Rate in normal range  ?  Pulse Readings from Last 1 Encounters:  ?12/28/21 74  ?  ?  ?  ?  Passed - Valid encounter within last 6 months  ?  Recent Outpatient Visits   ? ?      ? Yesterday Primary insomnia  ? [facility] [place], [place] W, NP  ? 1 month ago Primary hypertension  ? [facility] [place], [place] W, NP  ? 2 months ago Paronychia of great toe of right foot  ? [facility] [place], [place] W, NP  ? 3 months ago Paronychia of great toe of right foot  ? [place], DO  ? 4 months ago Encounter for general adult medical examination with abnormal findings  ? [facility] [place], Coralie Keens, NP  ? ?  ?  ?Future Appointments   ? ?        ? In 1 week Baity, Coralie Keens, NP [facility] [place], [place]  ? ?  ? ? ?  ?  ?  ? ?

## 2021-12-30 ENCOUNTER — Other Ambulatory Visit: Payer: Self-pay | Admitting: Family

## 2021-12-31 ENCOUNTER — Other Ambulatory Visit: Payer: Self-pay

## 2021-12-31 MED ORDER — OMEPRAZOLE 20 MG PO CPDR: 20.0000 mg | DELAYED_RELEASE_CAPSULE | Freq: Every day | ORAL | 1 refills | Status: DC

## 2022-01-05 ENCOUNTER — Ambulatory Visit: Payer: BC Managed Care – PPO | Admitting: Pharmacist

## 2022-01-05 DIAGNOSIS — I10 Essential (primary) hypertension: Secondary | ICD-10-CM

## 2022-01-05 NOTE — Chronic Care Management (AMB) (Signed)
?Chronic Care Management  ? ?Outreach Note ? ?01/05/2022 ?Name: Margaret Carlson MRN: [id number] DOB: 1975-09-19 ? ?I connected with Margaret Carlson on 01/05/22 by telephone outreach and verified that I am speaking with the correct person using two identifiers. ? ?Patient appearing on report for [place] Metric Hypertension Control due to last documented ambulatory blood pressure of 154/86 on 11/11/2021. Next appointment with PCP is 01/11/2022  ? ?Outreached patient to discuss hypertension control and medication management.  ? ?Outpatient Encounter Medications as of 01/05/2022  ?Medication Sig  ? acyclovir (ZOVIRAX) 400 MG tablet Take 1 tablet (400 mg total) by mouth 3 (three) times daily as needed (Take for 5 days for an outbreak).  ? amLODipine (NORVASC) 10 MG tablet TAKE 1 TABLET BY MOUTH  DAILY  ? FLUoxetine (PROZAC) 10 MG tablet Take 1 tablet by mouth once daily  ? labetalol (NORMODYNE) 100 MG tablet Take 1 tablet (100 mg total) by mouth 2 (two) times daily.  ? losartan-hydrochlorothiazide (HYZAAR) 100-12.5 MG tablet Take 1 tablet by mouth daily.  ? meloxicam (MOBIC) 15 MG tablet Take 1 tablet by mouth once daily (Patient taking differently: Take 15 mg by mouth daily as needed.)  ? Norethindrone Acetate-Ethinyl Estradiol (JUNEL 1.5/30) 1.5-30 MG-MCG tablet TAKE 1 TABLET BY MOUTH  DAILY FOR 3 WEEKS THEN OFF  FOR 1 WEEK  ? omeprazole (PRILOSEC) 20 MG capsule Take 1 capsule (20 mg total) by mouth daily.  ? rosuvastatin (CRESTOR) 10 MG tablet TAKE 1 TABLET BY MOUTH  DAILY  ? Calcium-Vitamin D-Vitamin K (CALCIUM + D + K PO) Take 1 capsule by mouth daily.  ? cyanocobalamin 2000 MCG tablet Take 2,000 mcg by mouth daily.  ? Multiple Vitamin (MULTIVITAMIN) tablet Take 1 tablet by mouth daily.  ? [DISCONTINUED] triamcinolone cream (KENALOG) 0.5 % Apply 1 application topically 2 (two) times daily as needed. To affected areas, for up to 2 weeks. To toe for swelling inflammation (Patient not taking: Reported on 01/05/2022)   ? ?No facility-administered encounter medications on file as of 01/05/2022.  ? ? ?Lab Results  ?Component Value Date  ? CREATININE 1.14 (H) 08/26/2021  ? BUN 12 08/26/2021  ? NA 138 08/26/2021  ? K 3.9 08/26/2021  ? CL 103 08/26/2021  ? CO2 24 08/26/2021  ? ? ?BP Readings from Last 3 Encounters:  ?12/28/21 (!) 156/98  ?11/25/21 (!) 154/92  ?11/11/21 (!) 154/86  ? ? ?Pulse Readings from Last 3 Encounters:  ?12/28/21 74  ?11/11/21 85  ?10/14/21 88  ? ? ?Current medications:  ?amlodipine 10 mg daily ?labetalol 100 mg twice daily ?losartan-hydochlorothiazide 100-12.5 mg daily ? ?Reports uses weekly pillbox to organize her medications; denies missed doses ?Encourage patient to continue to use weekly pillbox to aid with adherence ?Encourage patient to store medications away from moisture, heat and direct sunlight ? ? ?Home Monitoring: ?Patient does not have an automated upper arm home BP machine ?Encourage patient to consider obtaining an upper arm blood pressure monitor for home monitoring ?Counsel patient on importance of measuring arm and obtaining monitor with correct cuff size for accuracy of readings ?Counsel on BP monitoring technique ?Patient denies hypotensive or hypertensive symptoms ? ?Admits to sometimes adding salt to food when cooking ?Counsel on impact of salt/sodium on blood pressure ?Encourage patient to also review nutrition labels for sodium content ? ?Current physical activity: walking 2 miles ~25-30 minutes x 2 days/week and plans to start weight lifting ? ?Reports working on weight loss. Plans to work on having  more balanced diet, spreading meals throughout the day and limiting sugar. Reports has increased her water intake ? ?Caffeine: report drinks ~8-16 oz of coffee each morning  ? ?Feels recent stress related to health of her grandmother may be contributing to blood pressure elevation ? ?Insomnia ?Patient reports recently having difficulty with staying asleep. Reports sometimes uses ZzzQuil.   ?Counsel patient on sleep hygiene ?Advise patient against using ZzzQuil on a routine basis due to lack of efficacy with routine use/side effects  ?From review of chart, note PCP sent referral to pulmonology for sleep study on 5/2 ?Today patient reports received a voicemail from pulmonology regarding scheduling appointment ?Encourage patient call back to schedule ? ? ?Assessment/Plan: ?- Currently uncontrolled ?- Reviewed appropriate administration of medication regimen ?- Counseled on [place] term microvascular and macrovascular complications of uncontrolled hypertension ?- Reviewed appropriate home BP monitoring technique (avoid caffeine, smoking, and exercise for 30 minutes before checking, rest for at least 5 minutes before taking BP, sit with feet flat on the floor and back against a hard surface, uncross legs, and rest arm on flat surface) ?- Will securely e-mail patient blood pressure log, as well as handouts, as requested ?- Reviewed to check blood pressure, document, and provide at next provider visit on 5/16 ?- Discussed dietary modifications, such as reduced salt intake, focus on whole grains, vegetables, lean proteins ?- Discussed goal of 150 minutes of moderate intensity physical activity weekly ? ? ?Follow Up Plan: CM Pharmacist will outreach to patient by telephone again on 6/21 at 1 pm ? ?Wallace Cullens, PharmD, BCACP ?Clinical Pharmacist ?[facility] ?[place] ?[phone] ? ?

## 2022-01-05 NOTE — Patient Instructions (Signed)
Check your blood pressure, and any time you have concerning symptoms like headache, chest pain, dizziness, shortness of breath, or vision changes.  ? ?Our goal is less than 140/90. ? ?To appropriately check your blood pressure, make sure you do the following:  ?1) Avoid caffeine, exercise, or tobacco products for 30 minutes before checking. Empty your bladder. ?2) Sit with your back supported in a flat-backed chair. Rest your arm on something flat (arm of the chair, table, etc). ?3) Sit still with your feet flat on the floor, resting, for at least 5 minutes.  ?4) Check your blood pressure. Take 1-2 readings.  ?5) Write down these readings and bring with you to any provider appointments. ? ?Bring your home blood pressure machine with you to a provider's office for accuracy comparison at least once a year.  ? ?Make sure you take your blood pressure medications before you come to any office visit, even if you were asked to fast for labs. ? ?Wallace Cullens, PharmD, BCACP, CPP ?Clinical Pharmacist ?[facility] ?[place] ?[phone] ? ?

## 2022-01-11 ENCOUNTER — Encounter: Payer: Self-pay | Admitting: Adult Health

## 2022-01-11 ENCOUNTER — Ambulatory Visit (INDEPENDENT_AMBULATORY_CARE_PROVIDER_SITE_OTHER): Payer: BC Managed Care – PPO | Admitting: Adult Health

## 2022-01-11 ENCOUNTER — Ambulatory Visit: Payer: BC Managed Care – PPO | Admitting: Internal Medicine

## 2022-01-11 DIAGNOSIS — F5101 Primary insomnia: Secondary | ICD-10-CM | POA: Diagnosis not present

## 2022-01-11 DIAGNOSIS — R0683 Snoring: Secondary | ICD-10-CM | POA: Insufficient documentation

## 2022-01-11 NOTE — Assessment & Plan Note (Signed)
Healthy weight loss 

## 2022-01-11 NOTE — Addendum Note (Signed)
Addended by: Vanessa Barbara on: 01/11/2022 04:18 PM ? ? Modules accepted: Orders ? ?

## 2022-01-11 NOTE — Progress Notes (Signed)
? ?'@Patient'$  ID: Margaret Carlson, female    DOB: 19-Mar-1976, 46 y.o.   MRN: [id number] ? ?Chief Complaint  ?Patient presents with  ? Consult  ? ? ?Referring provider: ?Jearld Fenton, NP ? ?HPI: ?46 year old female that presents for sleep consult Jan 11, 2022 for insomnia ? ?TEST/EVENTS :  ? ?01/11/2022 Sleep consult  ?Patient presents for a sleep consult.  Kindly referred by her primary care provider Webb Silversmith , NP .  Patient planes that she has.  Has trouble going to sleep and wakes up frequently.  Typically goes to bed about 11 to 11:30 PM.  Takes up to an hour to go to sleep.  Wakes up few times through the night .   And awakens at 6 AM.  Patient's weight is up about 50 pounds over the last 2 years.  Current weight at 323 pounds with a BMI of 50. Does not feel she sleeps soundly.  ?Caffeine intake 2-3 cups of coffee. Take Zquill few times a week to help go to sleep .  ?Epworth score is 2 out of 24.  Mainly will get a little sleepy if she is a passenger of a car or in the afternoon hours. ?Told by others she snores. Does feel tired in the mornings , does get sleepy during the daytime.  ?Feels she is under some stress with family issues.  Wakes up worrying about things.  ?Has been having some blood pressure issues, more difficult to control .  ?Denies symptoms of cataplexy and sleep paralysis .  ?No naps. Screen time with phone and TV .  ? ? ?Medical history significant for hypertension, sickle cell trait. , hyperlipidemia , GERD ,  ? 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 ?Social history patient is single.  Does not have children.  Drinks alcohol socially.  No smoking or drugs. ? ?Family history is positive for DM and Dementia , Breast cancer  ? ?Allergies  ?Allergen Reactions  ? Ranitidine Swelling  ?  Face and lip swelling  ? ? ?Immunization History  ?Administered Date(s) Administered  ? Influenza,inj,Quad PF,6+ Mos 06/12/2017, 06/05/2018, 05/09/2019, 05/14/2020  ?  Influenza-Unspecified 06/16/2021  ? Moderna Covid Bivalent Peds Booster(59moThru 534yr 07/10/2020  ? Moderna Sars-Covid-2 Vaccination 10/12/2019, 11/09/2019  ? Tdap 03/24/2015  ? ? ?Past Medical History:  ?Diagnosis Date  ? Allergy   ? Anxiety   ? Hyperlipidemia   ? Hypertension   ? ? ?Tobacco History: ?Social History  ? ?Tobacco Use  ?Smoking Status Never  ?Smokeless Tobacco Never  ? ?Counseling given: Not Answered ? ? ?Outpatient Medications Prior to Visit  ?Medication Sig Dispense Refill  ? amLODipine (NORVASC) 10 MG tablet TAKE 1 TABLET BY MOUTH  DAILY 90 tablet 3  ? Calcium-Vitamin D-Vitamin K (CALCIUM + D + K PO) Take 1 capsule by mouth daily.    ? cyanocobalamin 2000 MCG tablet Take 2,000 mcg by mouth daily.    ? FLUoxetine (PROZAC) 10 MG tablet Take 1 tablet by mouth once daily 30 tablet 0  ? labetalol (NORMODYNE) 100 MG tablet Take 1 tablet (100 mg total) by mouth 2 (two) times daily. 60 tablet 0  ? losartan-hydrochlorothiazide (HYZAAR) 100-12.5 MG tablet Take 1 tablet by mouth daily. 90 tablet 0  ? Multiple Vitamin (MULTIVITAMIN) tablet Take 1 tablet by mouth daily.    ? Norethindrone Acetate-Ethinyl Estradiol (JUNEL 1.5/30) 1.5-30 MG-MCG tablet TAKE 1 TABLET BY MOUTH  DAILY FOR 3 WEEKS THEN OFF  FOR 1 WEEK 63 tablet 3  ? omeprazole (PRILOSEC) 20 MG capsule Take 1 capsule (20 mg total) by mouth daily. 90 capsule 1  ? rosuvastatin (CRESTOR) 10 MG tablet TAKE 1 TABLET BY MOUTH  DAILY 90 tablet 3  ? acyclovir (ZOVIRAX) 400 MG tablet Take 1 tablet (400 mg total) by mouth 3 (three) times daily as needed (Take for 5 days for an outbreak). (Patient not taking: Reported on 01/11/2022) 90 tablet 0  ? meloxicam (MOBIC) 15 MG tablet Take 1 tablet by mouth once daily (Patient not taking: Reported on 01/11/2022) 90 tablet 0  ? ?No facility-administered medications prior to visit.  ? ? ? ?Review of Systems:  ? ?Constitutional:   No  weight loss, night sweats,  Fevers, chills, fatigue, or  lassitude. ? ?HEENT:   No  headaches,  Difficulty swallowing,  Tooth/dental problems, or  Sore throat,  ?              No sneezing, itching, ear ache, nasal congestion, post nasal drip,  ? ?CV:  No chest pain,  Orthopnea, PND, swelling in lower extremities, anasarca, dizziness, palpitations, syncope.  ? ?GI  No heartburn, indigestion, abdominal pain, nausea, vomiting, diarrhea, change in bowel habits, loss of appetite, bloody stools.  ? ?Resp: No shortness of breath with exertion or at rest.  No excess mucus, no productive cough,  No non-productive cough,  No coughing up of blood.  No change in color of mucus.  No wheezing.  No chest wall deformity ? ?Skin: no rash or lesions. ? ?GU: no dysuria, change in color of urine, no urgency or frequency.  No flank pain, no hematuria  ? ?MS:  No joint pain or swelling.  No decreased range of motion.  No back pain. ? ? ? ?Physical Exam ? ?BP 110/70 (BP Location: Left Arm, Patient Position: Sitting, Cuff Size: Large)   Pulse 79   Temp 98.2 ?F (36.8 ?C) (Oral)   Ht '5\' 5"'$  (1.651 m)   Wt (!) 323 lb 9.6 oz (146.8 kg)   SpO2 99%   BMI 53.85 kg/m?  ? ?GEN: A/Ox3; pleasant , NAD, well nourished  ?  ?HEENT:  [place]/AT,  NOSE-clear, THROAT-clear, no lesions, no postnasal drip or exudate noted. Class 3 MP airway  ? ?NECK:  Supple w/ fair ROM; no JVD; normal carotid impulses w/o bruits; no thyromegaly or nodules palpated; no lymphadenopathy.   ? ?RESP  Clear  P & A; w/o, wheezes/ rales/ or rhonchi. no accessory muscle use, no dullness to percussion ? ?CARD:  RRR, no m/r/g, no peripheral edema, pulses intact, no cyanosis or clubbing. ? ?GI:   Soft & nt; nml bowel sounds; no organomegaly or masses detected.  ? ?Musco: Warm bil, no deformities or joint swelling noted.  ? ?Neuro: alert, no focal deficits noted.   ? ?Skin: Warm, no lesions or rashes ? ? ? ?Lab Results: ? ? ? ? ?BNP ?No results found for: BNP ? ?ProBNP ?No results found for: PROBNP ? ?Imaging: ?No results found. ? ? ? ? ?No results found for:  NITRICOXIDE ? ? ? ? ? ?Assessment & Plan:  ? ?Insomnia ?Insomnia.  Discussed healthy sleep regimen.  Does have some risk factors for sleep apnea with obesity with BMI of 53, snoring restless sleep and daytime sleepiness.  We will set up for home sleep study.  May try melatonin as needed for sleep.  Would avoid Benadryl if possible. ? ?Pending sleep study results we will decide if  any other sleep aids are appropriate on return visit ? ?Plan  ?Patient Instructions  ?Set up home sleep study ?Healthy sleep regimen ?Do not drive if sleepy ?Work on healthy weight loss ?Follow-up in 6 weeks to discuss results and go over treatment plan ?  ? ? ?Morbid obesity ([place]) ?Healthy weight loss ? ?Snoring ?Snoring, restless sleep, daytime sleepiness and BMI at 53 all suspicious for underlying sleep apnea.  Set up for home sleep study. ?Patient education given ? ?- discussed how weight can impact sleep and risk for sleep disordered breathing ?- discussed options to assist with weight loss: combination of diet modification, cardiovascular and strength training exercises ?  ?- had an extensive discussion regarding the adverse health consequences related to untreated sleep disordered breathing ?- specifically discussed the risks for hypertension, coronary artery disease, cardiac dysrhythmias, cerebrovascular disease, and diabetes ?- lifestyle modification discussed ?  ?- discussed how sleep disruption can increase risk of accidents, particularly when driving ?- safe driving practices were discussed ?  ?Plan  ?Patient Instructions  ?Set up home sleep study ?Healthy sleep regimen ?Do not drive if sleepy ?Work on healthy weight loss ?Follow-up in 6 weeks to discuss results and go over treatment plan ?  ? ? ? ? ?Rexene Edison, NP ?01/11/2022 ? ?

## 2022-01-11 NOTE — Assessment & Plan Note (Signed)
Snoring, restless sleep, daytime sleepiness and BMI at 53 all suspicious for underlying sleep apnea.  Set up for home sleep study. ?Patient education given ? ?- discussed how weight can impact sleep and risk for sleep disordered breathing ?- discussed options to assist with weight loss: combination of diet modification, cardiovascular and strength training exercises ?  ?- had an extensive discussion regarding the adverse health consequences related to untreated sleep disordered breathing ?- specifically discussed the risks for hypertension, coronary artery disease, cardiac dysrhythmias, cerebrovascular disease, and diabetes ?- lifestyle modification discussed ?  ?- discussed how sleep disruption can increase risk of accidents, particularly when driving ?- safe driving practices were discussed ?  ?Plan  ?Patient Instructions  ?Set up home sleep study ?Healthy sleep regimen ?Do not drive if sleepy ?Work on healthy weight loss ?Follow-up in 6 weeks to discuss results and go over treatment plan ?  ? ?

## 2022-01-11 NOTE — Assessment & Plan Note (Addendum)
Insomnia.  Discussed healthy sleep regimen.  Does have some risk factors for sleep apnea with obesity with BMI of 53, snoring restless sleep and daytime sleepiness.  We will set up for home sleep study.  May try melatonin as needed for sleep.  Would avoid Benadryl if possible. ? ?Pending sleep study results we will decide if any other sleep aids are appropriate on return visit ? ?Plan  ?Patient Instructions  ?Set up home sleep study ?Healthy sleep regimen ?Do not drive if sleepy ?Work on healthy weight loss ?Follow-up in 6 weeks to discuss results and go over treatment plan ?  ? ?

## 2022-01-11 NOTE — Patient Instructions (Signed)
Set up home sleep study ?Healthy sleep regimen ?Do not drive if sleepy ?Work on healthy weight loss ?Follow-up in 6 weeks to discuss results and go over treatment plan ?

## 2022-01-13 ENCOUNTER — Ambulatory Visit: Payer: BC Managed Care – PPO | Admitting: Internal Medicine

## 2022-01-19 ENCOUNTER — Ambulatory Visit (INDEPENDENT_AMBULATORY_CARE_PROVIDER_SITE_OTHER): Payer: BC Managed Care – PPO | Admitting: Internal Medicine

## 2022-01-19 ENCOUNTER — Encounter: Payer: Self-pay | Admitting: Internal Medicine

## 2022-01-19 DIAGNOSIS — N1831 Chronic kidney disease, stage 3a: Secondary | ICD-10-CM

## 2022-01-19 DIAGNOSIS — N183 Chronic kidney disease, stage 3 unspecified: Secondary | ICD-10-CM | POA: Insufficient documentation

## 2022-01-19 DIAGNOSIS — I10 Essential (primary) hypertension: Secondary | ICD-10-CM

## 2022-01-19 MED ORDER — LABETALOL HCL 100 MG PO TABS: 100.0000 mg | ORAL_TABLET | Freq: Two times a day (BID) | ORAL | 0 refills | Status: DC

## 2022-01-19 NOTE — Assessment & Plan Note (Signed)
Controlled on Amlodipine, Losartan HCT and Labetalol Reinforced DASH diet and exercise weight loss We will monitor

## 2022-01-19 NOTE — Patient Instructions (Signed)
Hypertension, Adult ?Hypertension is another name for high blood pressure. High blood pressure forces your heart to work harder to pump blood. This can cause problems over time. ?There are two numbers in a blood pressure reading. There is a top number (systolic) over a bottom number (diastolic). It is best to have a blood pressure that is below 120/80. ?What are the causes? ?The cause of this condition is not known. Some other conditions can lead to high blood pressure. ?What increases the risk? ?Some lifestyle factors can make you more likely to develop high blood pressure: ?Smoking. ?Not getting enough exercise or physical activity. ?Being overweight. ?Having too much fat, sugar, calories, or salt (sodium) in your diet. ?Drinking too much alcohol. ?Other risk factors include: ?Having any of these conditions: ?Heart disease. ?Diabetes. ?High cholesterol. ?Kidney disease. ?Obstructive sleep apnea. ?Having a family history of high blood pressure and high cholesterol. ?Age. The risk increases with age. ?Stress. ?What are the signs or symptoms? ?High blood pressure may not cause symptoms. Very high blood pressure (hypertensive crisis) may cause: ?Headache. ?Fast or uneven heartbeats (palpitations). ?Shortness of breath. ?Nosebleed. ?Vomiting or feeling like you may vomit (nauseous). ?Changes in how you see. ?Very bad chest pain. ?Feeling dizzy. ?Seizures. ?How is this treated? ?This condition is treated by making healthy lifestyle changes, such as: ?Eating healthy foods. ?Exercising more. ?Drinking less alcohol. ?Your doctor may prescribe medicine if lifestyle changes do not help enough and if: ?Your top number is above 130. ?Your bottom number is above 80. ?Your personal target blood pressure may vary. ?Follow these instructions at home: ?Eating and drinking ? ?If told, follow the DASH eating plan. To follow this plan: ?Fill one half of your plate at each meal with fruits and vegetables. ?Fill one fourth of your plate  at each meal with whole grains. Whole grains include whole-wheat pasta, brown rice, and whole-grain bread. ?Eat or drink low-fat dairy products, such as skim milk or low-fat yogurt. ?Fill one fourth of your plate at each meal with low-fat (lean) proteins. Low-fat proteins include fish, chicken without skin, eggs, beans, and tofu. ?Avoid fatty meat, cured and processed meat, or chicken with skin. ?Avoid pre-made or processed food. ?Limit the amount of salt in your diet to less than 1,500 mg each day. ?Do not drink alcohol if: ?Your doctor tells you not to drink. ?You are pregnant, may be pregnant, or are planning to become pregnant. ?If you drink alcohol: ?Limit how much you have to: ?0-1 drink a day for women. ?0-2 drinks a day for men. ?Know how much alcohol is in your drink. In the [place] one drink equals one 12 oz bottle of beer (355 mL), one 5 oz glass of wine (148 mL), or one 1? oz glass of hard liquor (44 mL). ?Lifestyle ? ?Work with your doctor to stay at a healthy weight or to lose weight. Ask your doctor what the best weight is for you. ?Get at least 30 minutes of exercise that causes your heart to beat faster (aerobic exercise) most days of the week. This may include walking, swimming, or biking. ?Get at least 30 minutes of exercise that strengthens your muscles (resistance exercise) at least 3 days a week. This may include lifting weights or doing Pilates. ?Do not smoke or use any products that contain nicotine or tobacco. If you need help quitting, ask your doctor. ?Check your blood pressure at home as told by your doctor. ?Keep all follow-up visits. ?Medicines ?Take over-the-counter and prescription medicines   only as told by your doctor. Follow directions carefully. ?Do not skip doses of blood pressure medicine. The medicine does not work as well if you skip doses. Skipping doses also puts you at risk for problems. ?Ask your doctor about side effects or reactions to medicines that you should watch  for. ?Contact a doctor if: ?You think you are having a reaction to the medicine you are taking. ?You have headaches that keep coming back. ?You feel dizzy. ?You have swelling in your ankles. ?You have trouble with your vision. ?Get help right away if: ?You get a very bad headache. ?You start to feel mixed up (confused). ?You feel weak or numb. ?You feel faint. ?You have very bad pain in your: ?Chest. ?Belly (abdomen). ?You vomit more than once. ?You have trouble breathing. ?These symptoms may be an emergency. Get help right away. Call 911. ?Do not wait to see if the symptoms will go away. ?Do not drive yourself to the hospital. ?Summary ?Hypertension is another name for high blood pressure. ?High blood pressure forces your heart to work harder to pump blood. ?For most people, a normal blood pressure is less than 120/80. ?Making healthy choices can help lower blood pressure. If your blood pressure does not get lower with healthy choices, you may need to take medicine. ?This information is not intended to replace advice given to you by your health care provider. Make sure you discuss any questions you have with your health care provider. ?Document Revised: 06/03/2021 Document Reviewed: 06/03/2021 ?Elsevier Patient Education ? 2023 [employer] ? ?

## 2022-01-19 NOTE — Progress Notes (Signed)
Subjective:    Patient ID: Margaret Carlson, female    DOB: December 15, 1975, 46 y.o.   MRN: [id number]  HPI  Patient presents to clinic today for 2-week follow-up of HTN.  At her last visit she was started on Labetalol in addition to her Amlodipine and Losartan HCT.  She has been taking the medication as prescribed.  Her BP today is 128/76.  ECG from 02/2015 reviewed  Review of Systems     Past Medical History:  Diagnosis Date   Allergy    Anxiety    Hyperlipidemia    Hypertension     Current Outpatient Medications  Medication Sig Dispense Refill   acyclovir (ZOVIRAX) 400 MG tablet Take 1 tablet (400 mg total) by mouth 3 (three) times daily as needed (Take for 5 days for an outbreak). (Patient not taking: Reported on 01/11/2022) 90 tablet 0   amLODipine (NORVASC) 10 MG tablet TAKE 1 TABLET BY MOUTH  DAILY 90 tablet 3   Calcium-Vitamin D-Vitamin K (CALCIUM + D + K PO) Take 1 capsule by mouth daily.     cyanocobalamin 2000 MCG tablet Take 2,000 mcg by mouth daily.     FLUoxetine (PROZAC) 10 MG tablet Take 1 tablet by mouth once daily 30 tablet 0   labetalol (NORMODYNE) 100 MG tablet Take 1 tablet (100 mg total) by mouth 2 (two) times daily. 60 tablet 0   losartan-hydrochlorothiazide (HYZAAR) 100-12.5 MG tablet Take 1 tablet by mouth daily. 90 tablet 0   meloxicam (MOBIC) 15 MG tablet Take 1 tablet by mouth once daily (Patient not taking: Reported on 01/11/2022) 90 tablet 0   Multiple Vitamin (MULTIVITAMIN) tablet Take 1 tablet by mouth daily.     Norethindrone Acetate-Ethinyl Estradiol (JUNEL 1.5/30) 1.5-30 MG-MCG tablet TAKE 1 TABLET BY MOUTH  DAILY FOR 3 WEEKS THEN OFF  FOR 1 WEEK 63 tablet 3   omeprazole (PRILOSEC) 20 MG capsule Take 1 capsule (20 mg total) by mouth daily. 90 capsule 1   rosuvastatin (CRESTOR) 10 MG tablet TAKE 1 TABLET BY MOUTH  DAILY 90 tablet 3   No current facility-administered medications for this visit.    Allergies  Allergen Reactions   Ranitidine Swelling     Face and lip swelling    Family History  Problem Relation Age of Onset   Arthritis Maternal Grandmother    Hypertension Maternal Grandmother    Breast cancer Maternal Grandmother 77   Colon cancer Neg Hx    Colon polyps Neg Hx    Rectal cancer Neg Hx    Stomach cancer Neg Hx    Esophageal cancer Neg Hx     Social History   Socioeconomic History   Marital status: Single    Spouse name: Not on file   Number of children: Not on file   Years of education: Not on file   Highest education level: Not on file  Occupational History   Not on file  Tobacco Use   Smoking status: Never   Smokeless tobacco: Never  Vaping Use   Vaping Use: Never used  Substance and Sexual Activity   Alcohol use: Yes    Alcohol/week: 0.0 standard drinks    Comment: occasional   Drug use: No   Sexual activity: Yes    Partners: Male    Birth control/protection: Pill  Other Topics Concern   Not on file  Social History Narrative   Not on file   Social Determinants of Health   Financial Resource Strain: Not  on file  Food Insecurity: Not on file  Transportation Needs: Not on file  Physical Activity: Not on file  Stress: Not on file  Social Connections: Not on file  Intimate Partner Violence: Not on file     Constitutional: Denies fever, malaise, fatigue, headache or abrupt weight changes.  Respiratory: Denies difficulty breathing, shortness of breath, cough or sputum production.   Cardiovascular: Denies chest pain, chest tightness, palpitations or swelling in the hands or feet.  Gastrointestinal: Denies abdominal pain, bloating, constipation, diarrhea or blood in the stool.  Neurological: Denies dizziness, difficulty with memory, difficulty with speech or problems with balance and coordination.   No other specific complaints in a complete review of systems (except as listed in HPI above).  Objective:   Physical Exam BP 128/76 (BP Location: Left Arm, Patient Position: Sitting, Cuff Size:  Large)   Pulse 76   Temp (!) 97.5 F (36.4 C) (Temporal)   Wt (!) 322 lb (146.1 kg)   SpO2 99%   BMI 53.58 kg/m   Wt Readings from Last 3 Encounters:  01/11/22 (!) 323 lb 9.6 oz (146.8 kg)  12/28/21 (!) 323 lb (146.5 kg)  11/11/21 (!) 322 lb (146.1 kg)    General: Appears her stated age, obese, in NAD. Cardiovascular: Normal rate and rhythm. S1,S2 noted.  No murmur, rubs or gallops noted.  Pulmonary/Chest: Normal effort and positive vesicular breath sounds. No respiratory distress. No wheezes, rales or ronchi noted.  Neurological: Alert and oriented.  BMET    Component Value Date/Time   NA 138 08/26/2021 1618   NA 137 07/03/2013 2132   K 3.9 08/26/2021 1618   K 3.6 07/03/2013 2132   CL 103 08/26/2021 1618   CL 103 07/03/2013 2132   CO2 24 08/26/2021 1618   CO2 29 07/03/2013 2132   GLUCOSE 76 08/26/2021 1618   GLUCOSE 118 (H) 06/18/2018 1756   GLUCOSE 90 07/03/2013 2132   BUN 12 08/26/2021 1618   BUN 9 07/03/2013 2132   CREATININE 1.14 (H) 08/26/2021 1618   CREATININE 0.85 07/03/2013 2132   CALCIUM 8.5 (L) 08/26/2021 1618   CALCIUM 9.0 07/03/2013 2132   GFRNONAA 71 08/24/2020 1626   GFRNONAA >60 07/03/2013 2132   GFRAA 82 08/24/2020 1626   GFRAA >60 07/03/2013 2132    Lipid Panel     Component Value Date/Time   CHOL 179 08/26/2021 1618   TRIG 65 08/26/2021 1618   HDL 57 08/26/2021 1618   CHOLHDL 3.1 08/26/2021 1618   LDLCALC 110 (H) 08/26/2021 1618    CBC    Component Value Date/Time   WBC 6.3 08/26/2021 1618   WBC 7.7 06/18/2018 1756   RBC 4.98 08/26/2021 1618   RBC 6.05 (H) 06/18/2018 1756   HGB 12.1 08/26/2021 1618   HCT 36.5 08/26/2021 1618   PLT 275 08/26/2021 1618   MCV 73 (L) 08/26/2021 1618   MCV 73 (L) 07/03/2013 2132   MCH 24.3 (L) 08/26/2021 1618   MCH 23.0 (L) 06/18/2018 1756   MCHC 33.2 08/26/2021 1618   MCHC 33.1 06/18/2018 1756   RDW 19.7 (H) 08/26/2021 1618   RDW 19.1 (H) 07/03/2013 2132   LYMPHSABS 2.5 03/17/2015 1618   EOSABS  0.1 03/17/2015 1618   BASOSABS 0.0 03/17/2015 1618    Hgb A1C Lab Results  Component Value Date   HGBA1C 5.4 08/26/2021            Assessment & Plan:    Webb Silversmith, NP

## 2022-01-19 NOTE — Assessment & Plan Note (Signed)
Encourage diet and exercise for weight loss 

## 2022-01-26 ENCOUNTER — Other Ambulatory Visit: Payer: Self-pay

## 2022-01-26 MED ORDER — FLUOXETINE HCL 10 MG PO TABS: 10.0000 mg | ORAL_TABLET | Freq: Every day | ORAL | 1 refills | Status: DC

## 2022-01-28 ENCOUNTER — Ambulatory Visit: Payer: BC Managed Care – PPO | Admitting: Internal Medicine

## 2022-02-03 MED ORDER — LOSARTAN POTASSIUM-HCTZ 100-12.5 MG PO TABS: 1.0000 | ORAL_TABLET | Freq: Every day | ORAL | 1 refills | Status: DC

## 2022-02-03 NOTE — Addendum Note (Signed)
Addended by: Ashley Royalty E on: 02/03/2022 01:45 PM   Modules accepted: Orders

## 2022-02-16 ENCOUNTER — Ambulatory Visit: Payer: BC Managed Care – PPO | Admitting: Pharmacist

## 2022-02-16 DIAGNOSIS — I10 Essential (primary) hypertension: Secondary | ICD-10-CM

## 2022-02-16 NOTE — Chronic Care Management (AMB) (Signed)
Chronic Care Management   Outreach Note  02/16/2022 Name: Margaret Carlson MRN: [id number] DOB: [birth year]  I connected with Margaret Carlson on 02/16/22 by telephone outreach and verified that I am speaking with the correct person using two identifiers.  Patient appearing on report for [place] Metric Hypertension Control due to documented ambulatory blood pressure of 154/86 on 11/11/2021.  Previously outreached to patient on 01/05/2022.  From review of chart, note patient seen for Office Visit with PCP on 5/24 and visit BP 128/76, HR 76 Patient also seen for Office Visit with Pulmonology on 5/16 and order placed for home sleep study  Outreached patient to discuss hypertension control and medication management.   Outpatient Encounter Medications as of 02/16/2022  Medication Sig   acyclovir (ZOVIRAX) 400 MG tablet Take 1 tablet (400 mg total) by mouth 3 (three) times daily as needed (Take for 5 days for an outbreak). (Patient not taking: Reported on 01/11/2022)   amLODipine (NORVASC) 10 MG tablet TAKE 1 TABLET BY MOUTH  DAILY   Calcium-Vitamin D-Vitamin K (CALCIUM + D + K PO) Take 1 capsule by mouth daily.   cyanocobalamin 2000 MCG tablet Take 2,000 mcg by mouth daily.   FLUoxetine (PROZAC) 10 MG tablet Take 1 tablet (10 mg total) by mouth daily.   labetalol (NORMODYNE) 100 MG tablet Take 1 tablet (100 mg total) by mouth 2 (two) times daily.   losartan-hydrochlorothiazide (HYZAAR) 100-12.5 MG tablet Take 1 tablet by mouth daily.   meloxicam (MOBIC) 15 MG tablet Take 1 tablet by mouth once daily (Patient not taking: Reported on 01/11/2022)   Multiple Vitamin (MULTIVITAMIN) tablet Take 1 tablet by mouth daily.   Norethindrone Acetate-Ethinyl Estradiol (JUNEL 1.5/30) 1.5-30 MG-MCG tablet TAKE 1 TABLET BY MOUTH  DAILY FOR 3 WEEKS THEN OFF  FOR 1 WEEK   omeprazole (PRILOSEC) 20 MG capsule Take 1 capsule (20 mg total) by mouth daily.   rosuvastatin (CRESTOR) 10 MG tablet TAKE 1 TABLET BY MOUTH   DAILY   No facility-administered encounter medications on file as of 02/16/2022.    Lab Results  Component Value Date   CREATININE 1.14 (H) 08/26/2021   BUN 12 08/26/2021   NA 138 08/26/2021   K 3.9 08/26/2021   CL 103 08/26/2021   CO2 24 08/26/2021    BP Readings from Last 3 Encounters:  01/19/22 128/76  01/11/22 110/70  12/28/21 (!) 156/98    Pulse Readings from Last 3 Encounters:  01/19/22 76  01/11/22 79  12/28/21 74    Current medications:  amlodipine 10 mg daily labetalol 100 mg twice daily losartan-hydochlorothiazide 100-12.5 mg daily   Uses weekly pillbox to organize her medications   Home Monitoring: Reports last checked home blood pressure today, reading:140/82, HR 69 Patient denies hypotensive or hypertensive symptoms   Have discussed impact of salt/sodium on blood pressure and encouraged patient to also review nutrition labels for sodium content    Assessment/Plan: - Currently controlled - Reviewed appropriate administration of medication regimen - Counseled on Hipps term microvascular and macrovascular complications of uncontrolled hypertension - Reviewed to check blood pressure , document, provide at next provider visit, but to call office or CM Pharmacist sooner for readings outside of established parameters or new symptoms - Recommend patient to follow up for scheduling of home sleep study as ordered by pulmonology   No further follow up required at this time. Provider patient with phone number for CM Pharmacist  Wallace Cullens, PharmD, [place]  [place] [phone]

## 2022-02-16 NOTE — Patient Instructions (Signed)
Check your blood pressure regularly, and any time you have concerning symptoms like headache, chest pain, dizziness, shortness of breath, or vision changes.   Our goal is less than 140/90.  To appropriately check your blood pressure, make sure you do the following:  1) Avoid caffeine, exercise, or tobacco products for 30 minutes before checking. Empty your bladder. 2) Sit with your back supported in a flat-backed chair. Rest your arm on something flat (arm of the chair, table, etc). 3) Sit still with your feet flat on the floor, resting, for at least 5 minutes.  4) Check your blood pressure. Take 1-2 readings.  5) Write down these readings and bring with you to any provider appointments.  Bring your home blood pressure machine with you to a provider's office for accuracy comparison at least once a year.   Make sure you take your blood pressure medications before you come to any office visit, even if you were asked to fast for labs.  Wallace Cullens, PharmD, [place] [phone]

## 2022-02-25 ENCOUNTER — Other Ambulatory Visit: Payer: Self-pay | Admitting: Family

## 2022-03-02 ENCOUNTER — Encounter: Payer: Self-pay | Admitting: Internal Medicine

## 2022-03-02 MED ORDER — NORETHINDRONE ACET-ETHINYL EST 1.5-30 MG-MCG PO TABS: ORAL_TABLET | ORAL | 3 refills | Status: DC

## 2022-03-02 NOTE — Addendum Note (Signed)
Addended by: Ashley Royalty E on: 03/02/2022 09:21 AM   Modules accepted: Orders

## 2022-04-13 ENCOUNTER — Ambulatory Visit: Payer: BC Managed Care – PPO

## 2022-04-13 DIAGNOSIS — G4733 Obstructive sleep apnea (adult) (pediatric): Secondary | ICD-10-CM

## 2022-04-13 DIAGNOSIS — F5101 Primary insomnia: Secondary | ICD-10-CM

## 2022-04-13 DIAGNOSIS — R0683 Snoring: Secondary | ICD-10-CM

## 2022-04-16 ENCOUNTER — Telehealth: Payer: Self-pay | Admitting: Pulmonary Disease

## 2022-04-16 DIAGNOSIS — G4733 Obstructive sleep apnea (adult) (pediatric): Secondary | ICD-10-CM | POA: Diagnosis not present

## 2022-04-16 NOTE — Telephone Encounter (Signed)
Call patient  Sleep study result  Date of study: 04/13/2022  Impression: Mild obstructive sleep apnea No significant oxygen desaturations  Recommendation: Options of treatment for mild obstructive sleep apnea will include    1.  CPAP therapy if there is significant daytime sleepiness or other comorbidities including history of CVA or cardiac disease  -If CPAP is chosen as an option of treatment auto titrating CPAP with a pressure setting of 5-15 will be appropriate  2.  Watchful waiting with emphasis on weight loss measures, sleep position modification to optimize lateral sleep, elevating the head of the bed by about 30 degrees may also help.  3.  An oral device may be fashioned for the treatment of mild sleep disordered breathing, will involve referral to dentist.  Follow-up as previously scheduled

## 2022-04-18 NOTE — Telephone Encounter (Signed)
I called her and have made her the follow up to go over home sleep test results. Nothing further needed.

## 2022-04-18 NOTE — Telephone Encounter (Signed)
Please set up office visit to discuss sleep study results May set up for in person or virtual and may double book

## 2022-04-26 ENCOUNTER — Telehealth (INDEPENDENT_AMBULATORY_CARE_PROVIDER_SITE_OTHER): Payer: BC Managed Care – PPO | Admitting: Adult Health

## 2022-04-26 ENCOUNTER — Encounter: Payer: Self-pay | Admitting: Adult Health

## 2022-04-26 DIAGNOSIS — R0683 Snoring: Secondary | ICD-10-CM

## 2022-04-26 NOTE — Patient Instructions (Addendum)
Referral to Dr. Ron Parker for Oral appliance for sleep apnea [phone]) Avoid sleeping on back if possible  May try Melatonin At bedtime  As needed insomnia  Healthy sleep regimen Do not drive if sleepy Work on healthy weight loss Follow-up in 1 year and As needed

## 2022-04-26 NOTE — Progress Notes (Signed)
Virtual Visit via Video Note  I connected with Margaret Carlson on 04/26/22 at 11:00 AM EDT by a video enabled telemedicine application and verified that I am speaking with the correct person using two identifiers.  Location:  Patient: Home  Provider: Office    I discussed the limitations of evaluation and management by telemedicine and the availability of in person appointments. The patient expressed understanding and agreed to proceed.  History of Present Illness: 46 year old female seen for sleep consult Jan 11, 2022 for restless sleep and insomnia, daytime sleepiness and snoring.  Patient was set up for a home sleep study that showed very mild sleep apnea, AHI 5.3/hour.  We discussed her sleep study results in detail.  Went over treatment options including weight loss, oral appliance and CPAP.  Patient has chronic insomnia with trouble going to sleep and staying asleep.  Has used several sleep aids in the past.  Patient would like to be referred for oral appliance evaluation.  She says her sleep is somewhat restless.  And feels unrefreshed when she awakens.  Past Medical History:  Diagnosis Date   Allergy    Anxiety    Hyperlipidemia    Hypertension      Current Outpatient Medications on File Prior to Visit  Medication Sig Dispense Refill   amLODipine (NORVASC) 10 MG tablet TAKE 1 TABLET BY MOUTH  DAILY 90 tablet 3   Calcium-Vitamin D-Vitamin K (CALCIUM + D + K PO) Take 1 capsule by mouth daily.     cyanocobalamin 2000 MCG tablet Take 2,000 mcg by mouth daily.     FLUoxetine (PROZAC) 10 MG tablet Take 1 tablet (10 mg total) by mouth daily. 90 tablet 1   labetalol (NORMODYNE) 100 MG tablet Take 1 tablet (100 mg total) by mouth 2 (two) times daily. 180 tablet 0   losartan-hydrochlorothiazide (HYZAAR) 100-12.5 MG tablet Take 1 tablet by mouth daily. 90 tablet 1   Multiple Vitamin (MULTIVITAMIN) tablet Take 1 tablet by mouth daily.     Norethindrone Acetate-Ethinyl Estradiol (JUNEL  1.5/30) 1.5-30 MG-MCG tablet TAKE 1 TABLET BY MOUTH  DAILY FOR 3 WEEKS THEN OFF  FOR 1 WEEK 63 tablet 3   omeprazole (PRILOSEC) 20 MG capsule Take 1 capsule (20 mg total) by mouth daily. 90 capsule 1   rosuvastatin (CRESTOR) 10 MG tablet TAKE 1 TABLET BY MOUTH  DAILY 90 tablet 3   acyclovir (ZOVIRAX) 400 MG tablet Take 1 tablet (400 mg total) by mouth 3 (three) times daily as needed (Take for 5 days for an outbreak). (Patient not taking: Reported on 01/11/2022) 90 tablet 0   meloxicam (MOBIC) 15 MG tablet Take 1 tablet by mouth once daily (Patient not taking: Reported on 01/11/2022) 90 tablet 0   No current facility-administered medications on file prior to visit.       Observations/Objective: Appears well in no distress.  Assessment and Plan: Very mild sleep apnea.  Patient education was given.  We will place referral for oral appliance.  We also discussed weight loss and positional sleep.  Patient education on healthy sleep regimen may try melatonin to help with sleep.  If symptoms persist despite the above would consider changing to CPAP  - discussed how weight can impact sleep and risk for sleep disordered breathing - discussed options to assist with weight loss: combination of diet modification, cardiovascular and strength training exercises   - had an extensive discussion regarding the adverse health consequences related to untreated sleep disordered breathing - specifically  discussed the risks for hypertension, coronary artery disease, cardiac dysrhythmias, cerebrovascular disease, and diabetes - lifestyle modification discussed   - discussed how sleep disruption can increase risk of accidents, particularly when driving - safe driving practices were discussed   Plan  Patient Instructions  Referral to Dr. Ron Parker for Oral appliance for sleep apnea [phone]) Avoid sleeping on back if possible  May try Melatonin At bedtime  As needed insomnia  Healthy sleep regimen Do not drive if  sleepy Work on healthy weight loss Follow-up in 1 year and As needed       Follow Up Instructions:    I discussed the assessment and treatment plan with the patient. The patient was provided an opportunity to ask questions and all were answered. The patient agreed with the plan and demonstrated an understanding of the instructions.   The patient was advised to call back or seek an in-person evaluation if the symptoms worsen or if the condition fails to improve as anticipated.  I provided 21 minutes of non-face-to-face time during this encounter.   Rexene Edison, NP

## 2022-06-02 ENCOUNTER — Encounter: Payer: Self-pay | Admitting: Internal Medicine

## 2022-06-03 NOTE — Telephone Encounter (Signed)
It looks like you talk with her about starting Saxenda or [facility] in May.  Pt is not diabetic or prediabetic.    Thanks,   -Mickel Baas

## 2022-06-14 ENCOUNTER — Other Ambulatory Visit: Payer: Self-pay | Admitting: Internal Medicine

## 2022-06-14 DIAGNOSIS — Z1231 Encounter for screening mammogram for malignant neoplasm of breast: Secondary | ICD-10-CM

## 2022-06-18 ENCOUNTER — Other Ambulatory Visit: Payer: Self-pay | Admitting: Internal Medicine

## 2022-06-20 NOTE — Telephone Encounter (Signed)
Requested medications are due for refill today.  Hyzaar is a little too soon  Requested medications are on the active medications list.  yes  Last refill. varies  Future visit scheduled.   yes  Notes to clinic.  To pharmacy: Please send a replace/new response with 90-Day Supply if appropriate to maximize member benefit. Requesting 1 year supply.    Requested Prescriptions  Pending Prescriptions Disp Refills   losartan-hydrochlorothiazide (HYZAAR) 100-12.5 MG tablet [Pharmacy Med Name: Losartan Potassium-HCTZ 100-12.5 MG Oral Tablet] 90 tablet 3    Sig: Take 1 tablet by mouth daily.     Cardiovascular: ARB + Diuretic Combos Failed - 06/18/2022  4:31 AM      Failed - K in normal range and within 180 days    Potassium  Date Value Ref Range Status  08/26/2021 3.9 3.5 - 5.2 mmol/L Final  07/03/2013 3.6 3.5 - 5.1 mmol/L Final         Failed - Na in normal range and within 180 days    Sodium  Date Value Ref Range Status  08/26/2021 138 134 - 144 mmol/L Final  07/03/2013 137 136 - 145 mmol/L Final         Failed - Cr in normal range and within 180 days    Creatinine  Date Value Ref Range Status  07/03/2013 0.85 0.60 - 1.30 mg/dL Final   Creatinine, Ser  Date Value Ref Range Status  08/26/2021 1.14 (H) 0.57 - 1.00 mg/dL Final         Failed - eGFR is 10 or above and within 180 days    EGFR (African American)  Date Value Ref Range Status  07/03/2013 >60  Final   GFR calc Af Amer  Date Value Ref Range Status  08/24/2020 82 >59 mL/min/1.73 Final    Comment:    **In accordance with recommendations from the NKF-ASN Task force,**   [facility] is in the process of updating its eGFR calculation to the   2021 CKD-EPI creatinine equation that estimates kidney function   without a race variable.    EGFR (Non-African Amer.)  Date Value Ref Range Status  07/03/2013 >60  Final    Comment:    eGFR values <47m/min/1.73 m2 may be an indication of chronic kidney disease  (CKD). Calculated eGFR is useful in patients with stable renal function. The eGFR calculation will not be reliable in acutely ill patients when serum creatinine is changing rapidly. It is not useful in  patients on dialysis. The eGFR calculation may not be applicable to patients at the low and high extremes of body sizes, pregnant women, and vegetarians.    GFR calc non Af Amer  Date Value Ref Range Status  08/24/2020 71 >59 mL/min/1.73 Final   eGFR  Date Value Ref Range Status  08/26/2021 60 >59 mL/min/1.73 Final         Passed - Patient is not pregnant      Passed - Last BP in normal range    BP Readings from Last 1 Encounters:  01/19/22 128/76         Passed - Valid encounter within last 6 months    Recent Outpatient Visits           4 months ago Primary hypertension   [place] [facility], RPH-CPP   5 months ago Stage 3a chronic kidney disease ([facility]   [facility]   5 months ago Primary hypertension  [place], RPH-CPP   5 months ago Primary insomnia   [facility] [place], Coralie Keens, NP   7 months ago Primary hypertension   [facility] [place], Coralie Keens, NP       Future Appointments             In 2 months Baity, Coralie Keens, NP [facility], [facility]             omeprazole (PRILOSEC) 20 MG capsule [facility] Name: Omeprazole 20 MG Oral Capsule Delayed Release] 90 capsule 3    Sig: TAKE 1 CAPSULE BY MOUTH DAILY     Gastroenterology: Proton Pump Inhibitors Passed - 06/18/2022  4:31 AM      Passed - Valid encounter within last 12 months    Recent Outpatient Visits           4 months ago Primary hypertension   [place], Grayland Ormond A, RPH-CPP   5 months ago Stage 3a chronic kidney disease ([place])   [facility], Coralie Keens, NP   5 months ago Primary hypertension   [place], Virl [place], RPH-CPP   5 months ago Primary insomnia   [facility] [place], Coralie Keens, NP   7 months ago Primary hypertension   [facility] [place], Coralie Keens, NP       Future Appointments             In 2 months Baity, Coralie Keens, NP [facility], [facility]             FLUoxetine (PROZAC) 10 MG tablet [Pharmacy Med Name: FLUOXETINE  10MG  TAB] 90 tablet 3    Sig: TAKE 1 TABLET BY MOUTH DAILY     Psychiatry:  Antidepressants - SSRI Passed - 06/18/2022  4:31 AM      Passed - Completed PHQ-2 or PHQ-9 in the last 360 days      Passed - Valid encounter within last 6 months    Recent Outpatient Visits           4 months ago Primary hypertension   [place], Grayland Ormond A, RPH-CPP   5 months ago Stage 3a chronic kidney disease [facility])   [facility] [place], Coralie Keens, NP   5 months ago Primary hypertension   [place], RPH-CPP   5 months ago Primary insomnia   [facility] [place], Coralie Keens, NP   7 months ago Primary hypertension   [facility] [place], Coralie Keens, NP       Future Appointments             In 2 months Baity, Coralie Keens, NP [facility], [facility]

## 2022-07-18 ENCOUNTER — Ambulatory Visit
Admission: RE | Admit: 2022-07-18 | Discharge: 2022-07-18 | Disposition: A | Discharge status: Home / Self Care | Payer: BC Managed Care – PPO | Source: Ambulatory Visit | Attending: Internal Medicine | Admitting: Internal Medicine

## 2022-07-18 DIAGNOSIS — Z1231 Encounter for screening mammogram for malignant neoplasm of breast: Secondary | ICD-10-CM | POA: Diagnosis not present

## 2022-08-02 ENCOUNTER — Ambulatory Visit
Admission: RE | Admit: 2022-08-02 | Discharge: 2022-08-02 | Disposition: A | Discharge status: Home / Self Care | Payer: BC Managed Care – PPO | Attending: Internal Medicine | Admitting: Internal Medicine

## 2022-08-02 ENCOUNTER — Encounter: Payer: Self-pay | Admitting: Internal Medicine

## 2022-08-02 ENCOUNTER — Ambulatory Visit (INDEPENDENT_AMBULATORY_CARE_PROVIDER_SITE_OTHER): Payer: BC Managed Care – PPO | Admitting: Internal Medicine

## 2022-08-02 ENCOUNTER — Ambulatory Visit
Admission: RE | Admit: 2022-08-02 | Discharge: 2022-08-02 | Disposition: A | Discharge status: Home / Self Care | Payer: BC Managed Care – PPO | Source: Ambulatory Visit | Attending: Internal Medicine | Admitting: Internal Medicine

## 2022-08-02 VITALS — BP 134/82 | HR 84 | Temp 97.5°F | Wt 334.0 lb

## 2022-08-02 DIAGNOSIS — M25562 Pain in left knee: Secondary | ICD-10-CM | POA: Diagnosis not present

## 2022-08-02 DIAGNOSIS — M25561 Pain in right knee: Secondary | ICD-10-CM | POA: Diagnosis not present

## 2022-08-02 DIAGNOSIS — G8929 Other chronic pain: Secondary | ICD-10-CM

## 2022-08-02 NOTE — Progress Notes (Signed)
Subjective:    Patient ID: Margaret Carlson, female    DOB: [birth year], 46 y.o.   MRN: [id number]  HPI  Patient presents to clinic today with complaint of bilateral knee pain.  This started 2 weeks ago.  She describes the pain as sore and achy.  Pain was worse with ambulation.  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 No acute osseous abnormality.  She was having back spasms prior to onset of this. She did see a chiropractor for an adjustment.  She also had treatment to her knees by the chiropractor.  Her knees feel better today. She has tried Meloxicam, heat and ice with significant improvement in symptoms.  Review of Systems     Past Medical History:  Diagnosis Date   Allergy    Anxiety    Hyperlipidemia    Hypertension     Current Outpatient Medications  Medication Sig Dispense Refill   acyclovir (ZOVIRAX) 400 MG tablet Take 1 tablet (400 mg total) by mouth 3 (three) times daily as needed (Take for 5 days for an outbreak). (Patient not taking: Reported on 01/11/2022) 90 tablet 0   amLODipine (NORVASC) 10 MG tablet TAKE 1 TABLET BY MOUTH  DAILY 90 tablet 3   Calcium-Vitamin D-Vitamin K (CALCIUM + D + K PO) Take 1 capsule by mouth daily.     cyanocobalamin 2000 MCG tablet Take 2,000 mcg by mouth daily.     FLUoxetine (PROZAC) 10 MG tablet TAKE 1 TABLET BY MOUTH DAILY 90 tablet 0   labetalol (NORMODYNE) 100 MG tablet Take 1 tablet (100 mg total) by mouth 2 (two) times daily. 180 tablet 0   losartan-hydrochlorothiazide (HYZAAR) 100-12.5 MG tablet TAKE 1 TABLET BY MOUTH DAILY 90 tablet 0   meloxicam (MOBIC) 15 MG tablet Take 1 tablet by mouth once daily (Patient not taking: Reported on 01/11/2022) 90 tablet 0   Multiple Vitamin (MULTIVITAMIN) tablet Take 1 tablet by mouth daily.     Norethindrone Acetate-Ethinyl Estradiol (JUNEL 1.5/30) 1.5-30 MG-MCG tablet TAKE 1 TABLET BY MOUTH  DAILY FOR 3 WEEKS THEN OFF  FOR 1  WEEK 63 tablet 3   omeprazole (PRILOSEC) 20 MG capsule TAKE 1 CAPSULE BY MOUTH DAILY 90 capsule 0   rosuvastatin (CRESTOR) 10 MG tablet TAKE 1 TABLET BY MOUTH  DAILY 90 tablet 3   No current facility-administered medications for this visit.    Allergies  Allergen Reactions   Ranitidine Swelling    Face and lip swelling    Family History  Problem Relation Age of Onset   Arthritis Maternal Grandmother    Hypertension Maternal Grandmother    Breast cancer Maternal Grandmother 77   Colon cancer Neg Hx    Colon polyps Neg Hx    Rectal cancer Neg Hx    Stomach cancer Neg Hx    Esophageal cancer Neg Hx     Social History   Socioeconomic History   Marital status: Single    Spouse name: Not on file   Number of children: Not on file   Years of education: Not on file   Highest education level: Not on file  Occupational History   Not on file  Tobacco Use   Smoking status: Never   Smokeless tobacco: Never  Vaping Use   Vaping Use: Never used  Substance and Sexual Activity   Alcohol use: Yes    Alcohol/week: 0.0 standard drinks of alcohol    Comment: occasional  Drug use: No   Sexual activity: Yes    Partners: Male    Birth control/protection: Pill  Other Topics Concern   Not on file  Social History Narrative   Not on file   Social Determinants of Health   Financial Resource Strain: Not on file  Food Insecurity: Not on file  Transportation Needs: Not on file  Physical Activity: Not on file  Stress: Not on file  Social Connections: Not on file  Intimate Partner Violence: Not on file     Constitutional: Denies fever, malaise, fatigue, headache or abrupt weight changes.  Respiratory: Denies difficulty breathing, shortness of breath, cough or sputum production.   Cardiovascular: Denies chest pain, chest tightness, palpitations or swelling in the hands or feet.  Musculoskeletal: Patient reports bilateral knee pain.  Denies decrease in range of motion, difficulty with  gait, muscle pain or joint swelling.  Skin: Denies redness, rashes, lesions or ulcercations.  Neurological: Denies numbness, tingling, weakness or problems with balance and coordination.    No other specific complaints in a complete review of systems (except as listed in HPI above).  Objective:   Physical Exam  BP 134/82 (BP Location: Right Wrist, Patient Position: Sitting, Cuff Size: Normal)   Pulse 84   Temp (!) 97.5 F (36.4 C) (Temporal)   Wt (!) 334 lb (151.5 kg)   SpO2 99%   BMI 55.58 kg/m   Wt Readings from Last 3 Encounters:  01/19/22 (!) 322 lb (146.1 kg)  01/11/22 (!) 323 lb 9.6 oz (146.8 kg)  12/28/21 (!) 323 lb (146.5 kg)    General: Appears her stated age, obese, in NAD. Cardiovascular: Normal rate. Pulmonary/Chest: Normal effort and positive vesicular breath sounds.  Musculoskeletal: Normal flexion and extension of the knees.  No pain with palpation of the knees.  Her right patella seems more protruded than her left.  No joint swelling noted.  No difficulty with gait.  Neurological: Alert and oriented.    BMET    Component Value Date/Time   NA 138 08/26/2021 1618   NA 137 07/03/2013 2132   K 3.9 08/26/2021 1618   K 3.6 07/03/2013 2132   CL 103 08/26/2021 1618   CL 103 07/03/2013 2132   CO2 24 08/26/2021 1618   CO2 29 07/03/2013 2132   GLUCOSE 76 08/26/2021 1618   GLUCOSE 118 (H) 06/18/2018 1756   GLUCOSE 90 07/03/2013 2132   BUN 12 08/26/2021 1618   BUN 9 07/03/2013 2132   CREATININE 1.14 (H) 08/26/2021 1618   CREATININE 0.85 07/03/2013 2132   CALCIUM 8.5 (L) 08/26/2021 1618   CALCIUM 9.0 07/03/2013 2132   GFRNONAA 71 08/24/2020 1626   GFRNONAA >60 07/03/2013 2132   GFRAA 82 08/24/2020 1626   GFRAA >60 07/03/2013 2132    Lipid Panel     Component Value Date/Time   CHOL 179 08/26/2021 1618   TRIG 65 08/26/2021 1618   HDL 57 08/26/2021 1618   CHOLHDL 3.1 08/26/2021 1618   LDLCALC 110 (H) 08/26/2021 1618    CBC    Component Value  Date/Time   WBC 6.3 08/26/2021 1618   WBC 7.7 06/18/2018 1756   RBC 4.98 08/26/2021 1618   RBC 6.05 (H) 06/18/2018 1756   HGB 12.1 08/26/2021 1618   HCT 36.5 08/26/2021 1618   PLT 275 08/26/2021 1618   MCV 73 (L) 08/26/2021 1618   MCV 73 (L) 07/03/2013 2132   MCH 24.3 (L) 08/26/2021 1618   MCH 23.0 (L) 06/18/2018 1756  MCHC 33.2 08/26/2021 1618   MCHC 33.1 06/18/2018 1756   RDW 19.7 (H) 08/26/2021 1618   RDW 19.1 (H) 07/03/2013 2132   LYMPHSABS 2.5 03/17/2015 1618   EOSABS 0.1 03/17/2015 1618   BASOSABS 0.0 03/17/2015 1618    Hgb A1C Lab Results  Component Value Date   HGBA1C 5.4 08/26/2021            Assessment & Plan:     Schedule an appointment for your annual exam Webb Silversmith, NP

## 2022-08-02 NOTE — Patient Instructions (Signed)
Elastic Bandage and RICE Therapy  Elastic bandages come in different shapes and sizes. They generally provide support to your injury and reduce swelling while you are healing, but they can perform different functions. Your health care provider will help you to decide what is best for your protection, recovery, or rehabilitation after an injury. The routine care of many injuries includes rest, ice, compression, and elevation (RICE therapy). RICE therapy is often recommended for injuries to soft tissues, such as muscle strain, sprains, bruises, and overuse injuries. It can also be used for some bone injuries. Using RICE therapy can help to relieve pain and lessen swelling. What are some general tips for using an elastic bandage? Use the bandage as directed by the maker of the bandage that you are using. Do not wrap the bandage too tightly. This may block (cut off) the circulation in the arm or leg in the area below the bandage. If part of your body beyond the bandage becomes blue, numb, cold, swollen, or more painful, your bandage is probably too tight. If this occurs, remove your bandage and reapply it more loosely. Remove and reapply an elastic bandage every 3-4 hours or as told by your health care provider. See your health care provider if the bandage seems to be making your problems worse rather than better. How to care for your injury with RICE therapy Rest Rest your injury. This may help with the healing process. Rest usually involves limiting your normal activities and not using the injured part of your body. Generally, you can return to your normal activities when your health care provider says it is okay and you can do them without much discomfort. If you rest the injury too much, it may not heal as well. Some injuries heal better with early movement instead of resting for too [place]. Talk with your health care provider about how you should limit your activities and whether you should start  range-of-motion exercises for your injury. Ice Ice your injury to lessen swelling and pain. Do not apply ice directly to your skin. Put ice in a plastic bag. Place a towel between your skin and the bag. Leave the ice on for 20 minutes, 2-3 times a day. Use ice on as many days as told by your health care provider.  Compression Put pressure (compression) on your injured area to control swelling, give support, and help with discomfort. Compression may be done with an elastic bandage. Elevation Raise (elevate) your injured area to lessen swelling and pain. If possible, elevate your injured area at or above the level of your heart or the center of your chest. Contact a health care provider if: Your pain and swelling continue. Your symptoms are getting worse rather than improving. Having these problems may mean that you need further evaluation or imaging tests, such as X-rays or an MRI. Sometimes, X-rays may not show a small broken bone (fracture) until days after the injury happened. Make a follow-up appointment with your health care provider. Ask your health care provider, or the department that is doing the imaging test, when your results will be ready. Get help right away if: You have sudden severe pain at or below the area of your injury. You have redness or increased swelling around your injury. You have tingling or numbness at or below the area of your injury and it does not improve after you remove the elastic bandage. Summary Elastic bandages provide support to your injury and reduce swelling while you are healing. Your  health care provider will help you decide which type of elastic bandage is best for your injury. Do not wrap the bandage too tightly. This may block (cut off) the circulation in the arm or leg in the area below the bandage. Putting pressure (compression) on your injured area with an elastic bandage is part of RICE therapy. RICE therapy includes rest, ice, compression, and  elevation. This treatment is recommended for the routine care of many injuries. This information is not intended to replace advice given to you by your health care provider. Make sure you discuss any questions you have with your health care provider. Document Revised: 10/11/2019 Document Reviewed: 05/05/2017 Elsevier Patient Education  [place].

## 2022-08-02 NOTE — Assessment & Plan Note (Signed)
We will obtain x-ray of bilateral knees, she plans to get this done after January 1 when she has new Scientist, forensic weight loss as this can help reduce joint pain Continue meloxicam, heat and ice as needed

## 2022-08-02 NOTE — Assessment & Plan Note (Signed)
Encourage weight loss as this can help reduce joint pain

## 2022-08-07 ENCOUNTER — Encounter: Payer: Self-pay | Admitting: Internal Medicine

## 2022-08-08 ENCOUNTER — Ambulatory Visit
Admission: RE | Admit: 2022-08-08 | Discharge: 2022-08-08 | Disposition: A | Discharge status: Home / Self Care | Payer: BC Managed Care – PPO | Source: Ambulatory Visit | Attending: Internal Medicine | Admitting: Internal Medicine

## 2022-08-08 ENCOUNTER — Ambulatory Visit
Admission: RE | Admit: 2022-08-08 | Discharge: 2022-08-08 | Disposition: A | Discharge status: Home / Self Care | Payer: BC Managed Care – PPO | Source: Home / Self Care | Attending: Internal Medicine | Admitting: Internal Medicine

## 2022-08-08 DIAGNOSIS — G8929 Other chronic pain: Secondary | ICD-10-CM | POA: Insufficient documentation

## 2022-08-08 DIAGNOSIS — M25562 Pain in left knee: Secondary | ICD-10-CM | POA: Diagnosis not present

## 2022-08-08 DIAGNOSIS — M25561 Pain in right knee: Secondary | ICD-10-CM | POA: Diagnosis not present

## 2022-08-08 DIAGNOSIS — M1711 Unilateral primary osteoarthritis, right knee: Secondary | ICD-10-CM | POA: Diagnosis not present

## 2022-08-08 DIAGNOSIS — M1712 Unilateral primary osteoarthritis, left knee: Secondary | ICD-10-CM | POA: Diagnosis not present

## 2022-08-08 MED ORDER — MELOXICAM 15 MG PO TABS: 15.0000 mg | ORAL_TABLET | Freq: Every day | ORAL | 0 refills | Status: DC

## 2022-08-25 ENCOUNTER — Ambulatory Visit (INDEPENDENT_AMBULATORY_CARE_PROVIDER_SITE_OTHER): Payer: BC Managed Care – PPO | Admitting: Internal Medicine

## 2022-08-25 ENCOUNTER — Encounter: Payer: Self-pay | Admitting: Internal Medicine

## 2022-08-25 VITALS — BP 142/82 | HR 83 | Temp 98.3°F | Resp 16 | Ht 65.0 in | Wt 337.0 lb

## 2022-08-25 DIAGNOSIS — Z0001 Encounter for general adult medical examination with abnormal findings: Secondary | ICD-10-CM

## 2022-08-25 DIAGNOSIS — E782 Mixed hyperlipidemia: Secondary | ICD-10-CM

## 2022-08-25 DIAGNOSIS — Z124 Encounter for screening for malignant neoplasm of cervix: Secondary | ICD-10-CM | POA: Diagnosis not present

## 2022-08-25 NOTE — Progress Notes (Signed)
Subjective:    Patient ID: Margaret Carlson, female    DOB: November 25, 1975, 46 y.o.   MRN: [id number]  HPI  Patient presents to clinic today for her annual exam. Of note, her BP today is 149/85. She is taking Amlodipine and Losartan HCT as prescribed but she is not taking Labetalol.  Flu: 05/2022 Tetanus: 02/2015 COVID: Moderna x 3 Pap smear: 07/2021 Mammogram: 06/2022 Colon screening: 11/2020 Vision screening: annually Dentist: biannually  Diet: She does eat meat.  She consumes fruits and vegetables.  She tries avoid fried foods. Exercise: Walking  Review of Systems     Past Medical History:  Diagnosis Date   Allergy    Anxiety    Hyperlipidemia    Hypertension     Current Outpatient Medications  Medication Sig Dispense Refill   acyclovir (ZOVIRAX) 400 MG tablet Take 1 tablet (400 mg total) by mouth 3 (three) times daily as needed (Take for 5 days for an outbreak). (Patient not taking: Reported on 01/11/2022) 90 tablet 0   amLODipine (NORVASC) 10 MG tablet TAKE 1 TABLET BY MOUTH  DAILY 90 tablet 3   Calcium-Vitamin D-Vitamin K (CALCIUM + D + K PO) Take 1 capsule by mouth daily.     cyanocobalamin 2000 MCG tablet Take 2,000 mcg by mouth daily.     FLUoxetine (PROZAC) 10 MG tablet TAKE 1 TABLET BY MOUTH DAILY 90 tablet 0   labetalol (NORMODYNE) 100 MG tablet Take 1 tablet (100 mg total) by mouth 2 (two) times daily. (Patient not taking: Reported on 08/02/2022) 180 tablet 0   losartan-hydrochlorothiazide (HYZAAR) 100-12.5 MG tablet TAKE 1 TABLET BY MOUTH DAILY 90 tablet 0   meloxicam (MOBIC) 15 MG tablet Take 1 tablet (15 mg total) by mouth daily. 90 tablet 0   Multiple Vitamin (MULTIVITAMIN) tablet Take 1 tablet by mouth daily.     Norethindrone Acetate-Ethinyl Estradiol (JUNEL 1.5/30) 1.5-30 MG-MCG tablet TAKE 1 TABLET BY MOUTH  DAILY FOR 3 WEEKS THEN OFF  FOR 1 WEEK 63 tablet 3   omeprazole (PRILOSEC) 20 MG capsule TAKE 1 CAPSULE BY MOUTH DAILY 90 capsule 0   rosuvastatin  (CRESTOR) 10 MG tablet TAKE 1 TABLET BY MOUTH  DAILY 90 tablet 3   No current facility-administered medications for this visit.    Allergies  Allergen Reactions   Ranitidine Swelling    Face and lip swelling    Family History  Problem Relation Age of Onset   Arthritis Maternal Grandmother    Hypertension Maternal Grandmother    Breast cancer Maternal Grandmother 77   Colon cancer Neg Hx    Colon polyps Neg Hx    Rectal cancer Neg Hx    Stomach cancer Neg Hx    Esophageal cancer Neg Hx     Social History   Socioeconomic History   Marital status: Single    Spouse name: Not on file   Number of children: Not on file   Years of education: Not on file   Highest education level: Not on file  Occupational History   Not on file  Tobacco Use   Smoking status: Never   Smokeless tobacco: Never  Vaping Use   Vaping Use: Never used  Substance and Sexual Activity   Alcohol use: Yes    Alcohol/week: 0.0 standard drinks of alcohol    Comment: occasional   Drug use: No   Sexual activity: Yes    Partners: Male    Birth control/protection: Pill  Other Topics Concern  Not on file  Social History Narrative   Not on file   Social Determinants of Health   Financial Resource Strain: Not on file  Food Insecurity: Not on file  Transportation Needs: Not on file  Physical Activity: Not on file  Stress: Not on file  Social Connections: Not on file  Intimate Partner Violence: Not on file     Constitutional: Denies fever, malaise, fatigue, headache or abrupt weight changes.  HEENT: Denies eye pain, eye redness, ear pain, ringing in the ears, wax buildup, runny nose, nasal congestion, bloody nose, or sore throat. Respiratory: Denies difficulty breathing, shortness of breath, cough or sputum production.   Cardiovascular: Denies chest pain, chest tightness, palpitations or swelling in the hands or feet.  Gastrointestinal: Denies abdominal pain, bloating, constipation, diarrhea or  blood in the stool.  GU: Denies urgency, frequency, pain with urination, burning sensation, blood in urine, odor or discharge. Musculoskeletal: Patient reports knee pain.  Denies decrease in range of motion, difficulty with gait, muscle pain or joint swelling.  Skin: Denies redness, rashes, lesions or ulcercations.  Neurological: Patient reports insomnia.  Denies dizziness, difficulty with memory, difficulty with speech or problems with balance and coordination.  Psych: Patient has a history of anxiety and depression.  Denies SI/HI.  No other specific complaints in a complete review of systems (except as listed in HPI above).  Objective:   Physical Exam  BP (!) 149/85 (BP Location: Right Arm, Patient Position: Sitting, Cuff Size: Large)   Pulse 83   Temp 98.3 F (36.8 C) (Oral)   Resp 16   Ht _0  (1.651 m)   Wt (!) 337 lb (152.9 kg)   SpO2 99%   BMI 56.08 kg/m   Wt Readings from Last 3 Encounters:  08/02/22 (!) 334 lb (151.5 kg)  01/19/22 (!) 322 lb (146.1 kg)  01/11/22 (!) 323 lb 9.6 oz (146.8 kg)    General: Appears her stated age, obese in NAD. Skin: Warm, dry and intact.  HEENT: Head: normal shape and size; Eyes: sclera white, no icterus, conjunctiva pink, PERRLA and EOMs intact;  Neck:  Neck supple, trachea midline. No masses, lumps or thyromegaly present.  Cardiovascular: Normal rate and rhythm. S1,S2 noted.  No murmur, rubs or gallops noted. No JVD.  Trace pitting BLE edema.  Pulmonary/Chest: Normal effort and positive vesicular breath sounds. No respiratory distress. No wheezes, rales or ronchi noted.  Abdomen:  Normal bowel sounds. Pelvic: Normal female anatomy.  Cervix not well-visualized.  White discharge noted in the vaginal vault.  No CMT.  Adnexa nonpalpable. Musculoskeletal: Strength 5/5 BUE/BLE.  No difficulty with gait.  Neurological: Alert and oriented. Cranial nerves II-XII grossly intact. Coordination normal.  Psychiatric: Mood and affect normal. Behavior  is normal. Judgment and thought content normal.     BMET    Component Value Date/Time   NA 138 08/26/2021 1618   NA 137 07/03/2013 2132   K 3.9 08/26/2021 1618   K 3.6 07/03/2013 2132   CL 103 08/26/2021 1618   CL 103 07/03/2013 2132   CO2 24 08/26/2021 1618   CO2 29 07/03/2013 2132   GLUCOSE 76 08/26/2021 1618   GLUCOSE 118 (H) 06/18/2018 1756   GLUCOSE 90 07/03/2013 2132   BUN 12 08/26/2021 1618   BUN 9 07/03/2013 2132   CREATININE 1.14 (H) 08/26/2021 1618   CREATININE 0.85 07/03/2013 2132   CALCIUM 8.5 (L) 08/26/2021 1618   CALCIUM 9.0 07/03/2013 2132   GFRNONAA 71 08/24/2020 1626  GFRNONAA >60 07/03/2013 2132   GFRAA 82 08/24/2020 1626   GFRAA >60 07/03/2013 2132    Lipid Panel     Component Value Date/Time   CHOL 179 08/26/2021 1618   TRIG 65 08/26/2021 1618   HDL 57 08/26/2021 1618   CHOLHDL 3.1 08/26/2021 1618   LDLCALC 110 (H) 08/26/2021 1618    CBC    Component Value Date/Time   WBC 6.3 08/26/2021 1618   WBC 7.7 06/18/2018 1756   RBC 4.98 08/26/2021 1618   RBC 6.05 (H) 06/18/2018 1756   HGB 12.1 08/26/2021 1618   HCT 36.5 08/26/2021 1618   PLT 275 08/26/2021 1618   MCV 73 (L) 08/26/2021 1618   MCV 73 (L) 07/03/2013 2132   MCH 24.3 (L) 08/26/2021 1618   MCH 23.0 (L) 06/18/2018 1756   MCHC 33.2 08/26/2021 1618   MCHC 33.1 06/18/2018 1756   RDW 19.7 (H) 08/26/2021 1618   RDW 19.1 (H) 07/03/2013 2132   LYMPHSABS 2.5 03/17/2015 1618   EOSABS 0.1 03/17/2015 1618   BASOSABS 0.0 03/17/2015 1618    Hgb A1C Lab Results  Component Value Date   HGBA1C 5.4 08/26/2021           Assessment & Plan:   Preventative Health Maintenance:  Flu shot today Tetanus UTD Encouraged her to get her COVID booster Pap smear today, she declines STD screening Mammogram UTD Colon screening UTD Encouraged her to consume a balanced diet and exercise regimen Advised her to see an eye doctor and dentist annually We will check CBC, c-Met, lipid, A1c, vitamin D  and TSH today  RTC in 6 months, follow-up chronic conditions Webb Silversmith, NP

## 2022-08-25 NOTE — Assessment & Plan Note (Signed)
Encouraged diet and exercise for weight loss ?

## 2022-08-25 NOTE — Patient Instructions (Signed)

## 2022-08-27 LAB — LIPID PANEL
Chol/HDL Ratio: 3.3 ratio (ref 0.0–4.4)
Cholesterol, Total: 175 mg/dL (ref 100–199)
HDL: 53 mg/dL (ref >39)
LDL Chol Calc (NIH): 110 mg/dL — ABNORMAL HIGH (ref 0–99)
Triglycerides: 65 mg/dL (ref 0–149)
VLDL Cholesterol Cal: 12 mg/dL (ref 5–40)

## 2022-08-27 LAB — COMPREHENSIVE METABOLIC PANEL
ALT: 10 IU/L (ref 0–32)
AST: 16 IU/L (ref 0–40)
Albumin/Globulin Ratio: 1.1 — ABNORMAL LOW (ref 1.2–2.2)
Albumin: 3.6 g/dL — ABNORMAL LOW (ref 3.9–4.9)
Alkaline Phosphatase: 77 IU/L (ref 44–121)
BUN/Creatinine Ratio: 12 (ref 9–23)
BUN: 15 mg/dL (ref 6–24)
Bilirubin Total: <0.2 mg/dL (ref 0.0–1.2)
CO2: 27 mmol/L (ref 20–29)
Calcium: 8.6 mg/dL — ABNORMAL LOW (ref 8.7–10.2)
Chloride: 103 mmol/L (ref 96–106)
Creatinine, Ser: 1.22 mg/dL — ABNORMAL HIGH (ref 0.57–1.00)
Globulin, Total: 3.4 g/dL (ref 1.5–4.5)
Glucose: 109 mg/dL — ABNORMAL HIGH (ref 70–99)
Potassium: 3.5 mmol/L (ref 3.5–5.2)
Sodium: 141 mmol/L (ref 134–144)
Total Protein: 7 g/dL (ref 6.0–8.5)
eGFR: 55 mL/min/{1.73_m2} — ABNORMAL LOW (ref >59)

## 2022-08-27 LAB — CBC
Hematocrit: 36.2 % (ref 34.0–46.6)
Hemoglobin: 11.8 g/dL (ref 11.1–15.9)
MCH: 23.6 pg — ABNORMAL LOW (ref 26.6–33.0)
MCHC: 32.6 g/dL (ref 31.5–35.7)
MCV: 73 fL — ABNORMAL LOW (ref 79–97)
Platelets: 300 10*3/uL (ref 150–450)
RBC: 4.99 x10E6/uL (ref 3.77–5.28)
RDW: 19.9 % — ABNORMAL HIGH (ref 11.7–15.4)
WBC: 6.8 10*3/uL (ref 3.4–10.8)

## 2022-08-27 LAB — TSH: TSH: 0.955 u[IU]/mL (ref 0.450–4.500)

## 2022-08-27 LAB — HEMOGLOBIN A1C
Est. average glucose Bld gHb Est-mCnc: 114 mg/dL
Hgb A1c MFr Bld: 5.6 % (ref 4.8–5.6)

## 2022-08-27 LAB — VITAMIN D 25 HYDROXY (VIT D DEFICIENCY, FRACTURES): Vit D, 25-Hydroxy: 15.1 ng/mL — ABNORMAL LOW (ref 30.0–100.0)

## 2022-08-30 MED ORDER — VITAMIN D (ERGOCALCIFEROL) 1.25 MG (50000 UNIT) PO CAPS: 50000.0000 [IU] | ORAL_CAPSULE | ORAL | 0 refills | Status: DC

## 2022-08-30 NOTE — Addendum Note (Signed)
Addended by: Jearld Fenton on: 08/30/2022 10:12 AM   Modules accepted: Orders

## 2022-09-05 ENCOUNTER — Encounter: Payer: Self-pay | Admitting: Internal Medicine

## 2022-09-05 MED ORDER — LOSARTAN POTASSIUM 100 MG PO TABS: 100.0000 mg | ORAL_TABLET | Freq: Every day | ORAL | 1 refills | Status: DC

## 2022-09-14 ENCOUNTER — Encounter: Payer: Self-pay | Admitting: Internal Medicine

## 2022-09-29 ENCOUNTER — Ambulatory Visit (INDEPENDENT_AMBULATORY_CARE_PROVIDER_SITE_OTHER): Payer: Managed Care, Other (non HMO) | Admitting: Internal Medicine

## 2022-09-29 ENCOUNTER — Encounter: Payer: Self-pay | Admitting: Internal Medicine

## 2022-09-29 VITALS — BP 138/86 | HR 77 | Temp 97.3°F | Ht 65.0 in | Wt 333.0 lb

## 2022-09-29 DIAGNOSIS — Z01419 Encounter for gynecological examination (general) (routine) without abnormal findings: Secondary | ICD-10-CM

## 2022-09-29 DIAGNOSIS — Z124 Encounter for screening for malignant neoplasm of cervix: Secondary | ICD-10-CM

## 2022-09-29 NOTE — Addendum Note (Signed)
Addended by: Jearld Fenton on: 09/29/2022 03:08 PM   Modules accepted: Orders

## 2022-09-29 NOTE — Patient Instructions (Signed)
Pap Test Why am I having this test? A Pap test, also called a Pap smear, is a screening test to check for signs of: Infection. Cancer of the cervix. The cervix is the lower part of the uterus that opens into the vagina. Changes that may be a sign that cancer is developing (precancerous changes). Women need this test on a regular basis. In general, you should have a Pap test every 3 years until you reach menopause or age 47. Women aged 30-60 may choose to have their Pap test done at the same time as an HPV (human papillomavirus) test every 5 years (instead of every 3 years). Your health care provider may recommend having Pap tests more or less often depending on your medical conditions and past Pap test results. What is being tested? Cervical cells are tested for signs of infection or abnormalities. What kind of sample is taken?  Your health care provider will collect a sample of cells from the surface of your cervix. This will be done using a small cotton swab, plastic spatula, or brush that is inserted into your vagina using a tool called a speculum. This sample is often collected during a pelvic exam, when you are lying on your back on an exam table with your feet in footrests (stirrups). In some cases, fluids (secretions) from the cervix or vagina may also be collected. How do I prepare for this test? Be aware of where you are in your menstrual cycle. If you are menstruating on the day of the test, you may be asked to reschedule. You may need to reschedule if you have a known vaginal infection on the day of the test. Follow instructions from your health care provider about: Changing or stopping your regular medicines. Some medicines can cause abnormal test results, such as vaginal medicines and tetracycline. Avoiding douching 2-3 days before or the day of the test. Tell a health care provider about: Any allergies you have. All medicines you are taking, including vitamins, herbs, eye drops,  creams, and over-the-counter medicines. Any bleeding problems you have. Any surgeries you have had. Any medical conditions you have. Whether you are pregnant or may be pregnant. How are the results reported? Your test results will be reported as either abnormal or normal. What do the results mean? A normal test result means that you do not have signs of cancer of the cervix. An abnormal result may mean that you have: Cancer. A Pap test by itself is not enough to diagnose cancer. You will have more tests done if cancer is suspected. Precancerous changes in your cervix. Inflammation of the cervix. An STI (sexually transmitted infection). A fungal infection. A parasite infection. Talk with your health care provider about what your results mean. In some cases, your health care provider may do more testing to confirm the results. Questions to ask your health care provider Ask your health care provider, or the department that is doing the test: When will my results be ready? How will I get my results? What are my treatment options? What other tests do I need? What are my next steps? Summary In general, women should have a Pap test every 3 years until they reach menopause or age 65. Your health care provider will collect a sample of cells from the surface of your cervix. This will be done using a small cotton swab, plastic spatula, or brush. In some cases, fluids (secretions) from the cervix or vagina may also be collected. This information is not  intended to replace advice given to you by your health care provider. Make sure you discuss any questions you have with your health care provider. Document Revised: 11/13/2020 Document Reviewed: 11/13/2020 Elsevier Patient Education  [place].

## 2022-09-29 NOTE — Progress Notes (Signed)
Subjective:    Patient ID: Margaret Carlson, female    DOB: 05/24/1976, 47 y.o.   MRN: [id number]  HPI  Patient presents today for her Pap smear.  Her last Pap smear was 07/2021, normal.  Review of Systems  Past Medical History:  Diagnosis Date   Allergy    Anxiety    Hyperlipidemia    Hypertension     Current Outpatient Medications  Medication Sig Dispense Refill   acyclovir (ZOVIRAX) 400 MG tablet Take 1 tablet (400 mg total) by mouth 3 (three) times daily as needed (Take for 5 days for an outbreak). 90 tablet 0   amLODipine (NORVASC) 10 MG tablet TAKE 1 TABLET BY MOUTH  DAILY 90 tablet 3   Calcium-Vitamin D-Vitamin K (CALCIUM + D + K PO) Take 1 capsule by mouth daily.     cyanocobalamin 2000 MCG tablet Take 2,000 mcg by mouth daily.     FLUoxetine (PROZAC) 10 MG tablet TAKE 1 TABLET BY MOUTH DAILY 90 tablet 0   losartan (COZAAR) 100 MG tablet Take 1 tablet (100 mg total) by mouth daily. 90 tablet 1   meloxicam (MOBIC) 15 MG tablet Take 1 tablet (15 mg total) by mouth daily. 90 tablet 0   Multiple Vitamin (MULTIVITAMIN) tablet Take 1 tablet by mouth daily.     Norethindrone Acetate-Ethinyl Estradiol (JUNEL 1.5/30) 1.5-30 MG-MCG tablet TAKE 1 TABLET BY MOUTH  DAILY FOR 3 WEEKS THEN OFF  FOR 1 WEEK 63 tablet 3   omeprazole (PRILOSEC) 20 MG capsule TAKE 1 CAPSULE BY MOUTH DAILY 90 capsule 0   rosuvastatin (CRESTOR) 10 MG tablet TAKE 1 TABLET BY MOUTH  DAILY 90 tablet 3   Vitamin D, Ergocalciferol, (DRISDOL) 1.25 MG (50000 UNIT) CAPS capsule Take 1 capsule (50,000 Units total) by mouth once a week. For 12 weeks. Then start OTC Vitamin D3 2,000 unit daily. 12 capsule 0   No current facility-administered medications for this visit.    Allergies  Allergen Reactions   Ranitidine Swelling    Face and lip swelling    Family History  Problem Relation Age of Onset   Arthritis Maternal Grandmother    Hypertension Maternal Grandmother    Breast cancer Maternal Grandmother 77    Colon cancer Neg Hx    Colon polyps Neg Hx    Rectal cancer Neg Hx    Stomach cancer Neg Hx    Esophageal cancer Neg Hx     Social History   Socioeconomic History   Marital status: Single    Spouse name: Not on file   Number of children: Not on file   Years of education: Not on file   Highest education level: Not on file  Occupational History   Not on file  Tobacco Use   Smoking status: Never   Smokeless tobacco: Never  Vaping Use   Vaping Use: Never used  Substance and Sexual Activity   Alcohol use: Yes    Alcohol/week: 0.0 standard drinks of alcohol    Comment: occasional   Drug use: No   Sexual activity: Yes    Partners: Male    Birth control/protection: Pill  Other Topics Concern   Not on file  Social History Narrative   Not on file   Social Determinants of Health   Financial Resource Strain: Not on file  Food Insecurity: Not on file  Transportation Needs: Not on file  Physical Activity: Not on file  Stress: Not on file  Social Connections: Not on  file  Intimate Partner Violence: Not on file     Constitutional: Denies fever, malaise, fatigue, headache or abrupt weight changes.  Respiratory: Denies difficulty breathing, shortness of breath, cough or sputum production.   Cardiovascular: Denies chest pain, chest tightness, palpitations or swelling in the hands or feet.  Gastrointestinal: Denies abdominal pain, bloating, constipation, diarrhea or blood in the stool.  GU: Denies urgency, frequency, pain with urination, burning sensation, blood in urine, odor or discharge.  No other specific complaints in a complete review of systems (except as listed in HPI above).     Objective:   Physical Exam BP 138/86 (BP Location: Right Arm, Patient Position: Sitting, Cuff Size: Large)   Pulse 77   Temp (!) 97.3 F (36.3 C) (Temporal)   Ht '5\' 5"'$  (1.651 m)   Wt (!) 333 lb (151 kg)   SpO2 100%   BMI 55.41 kg/m   Wt Readings from Last 3 Encounters:  08/25/22 (!)  337 lb (152.9 kg)  08/02/22 (!) 334 lb (151.5 kg)  01/19/22 (!) 322 lb (146.1 kg)    General: Appears her stated age, obese, in NAD. Cardiovascular: Normal rate and rhythm.  Pulmonary/Chest: Normal effort and positive vesicular breath sounds. No respiratory distress. No wheezes, rales or ronchi noted.  Abdomen: Soft and nontender. Normal bowel sounds. No distention or masses noted.  Pelvic: Normal female anatomy.  Cervix without mass or lesion.  No CMT.  Adnexa nonpalpable.  Neurological: Alert and oriented.   BMET    Component Value Date/Time   NA 141 08/26/2022 1533   NA 137 07/03/2013 2132   K 3.5 08/26/2022 1533   K 3.6 07/03/2013 2132   CL 103 08/26/2022 1533   CL 103 07/03/2013 2132   CO2 27 08/26/2022 1533   CO2 29 07/03/2013 2132   GLUCOSE 109 (H) 08/26/2022 1533   GLUCOSE 118 (H) 06/18/2018 1756   GLUCOSE 90 07/03/2013 2132   BUN 15 08/26/2022 1533   BUN 9 07/03/2013 2132   CREATININE 1.22 (H) 08/26/2022 1533   CREATININE 0.85 07/03/2013 2132   CALCIUM 8.6 (L) 08/26/2022 1533   CALCIUM 9.0 07/03/2013 2132   GFRNONAA 71 08/24/2020 1626   GFRNONAA >60 07/03/2013 2132   GFRAA 82 08/24/2020 1626   GFRAA >60 07/03/2013 2132    Lipid Panel     Component Value Date/Time   CHOL 175 08/26/2022 1533   TRIG 65 08/26/2022 1533   HDL 53 08/26/2022 1533   CHOLHDL 3.3 08/26/2022 1533   LDLCALC 110 (H) 08/26/2022 1533    CBC    Component Value Date/Time   WBC 6.8 08/26/2022 1533   WBC 7.7 06/18/2018 1756   RBC 4.99 08/26/2022 1533   RBC 6.05 (H) 06/18/2018 1756   HGB 11.8 08/26/2022 1533   HCT 36.2 08/26/2022 1533   PLT 300 08/26/2022 1533   MCV 73 (L) 08/26/2022 1533   MCV 73 (L) 07/03/2013 2132   MCH 23.6 (L) 08/26/2022 1533   MCH 23.0 (L) 06/18/2018 1756   MCHC 32.6 08/26/2022 1533   MCHC 33.1 06/18/2018 1756   RDW 19.9 (H) 08/26/2022 1533   RDW 19.1 (H) 07/03/2013 2132   LYMPHSABS 2.5 03/17/2015 1618   EOSABS 0.1 03/17/2015 1618   BASOSABS 0.0  03/17/2015 1618    Hgb A1C Lab Results  Component Value Date   HGBA1C 5.6 08/26/2022            Assessment & Plan:   Screen for Cervical Cancer with Routine GYN  exam:  Pap smear today- she declines STD screening   RTC in 4 months for follow-up of chronic conditions Margaret Silversmith, NP

## 2022-10-05 LAB — PAP LB (LIQUID-BASED)

## 2022-10-08 ENCOUNTER — Other Ambulatory Visit: Payer: Self-pay | Admitting: Internal Medicine

## 2022-10-10 NOTE — Telephone Encounter (Signed)
Rx was changed to losartan on 09/05/22 #90/1.   Requested Prescriptions  Refused Prescriptions Disp Refills   losartan-hydrochlorothiazide (HYZAAR) 100-12.5 MG tablet [Pharmacy Med Name: Losartan Potassium-HCTZ 100-12.5 MG Oral Tablet] 90 tablet 3    Sig: TAKE 1 TABLET BY MOUTH DAILY     Cardiovascular: ARB + Diuretic Combos Failed - 10/08/2022  4:37 AM      Failed - Cr in normal range and within 180 days    Creatinine  Date Value Ref Range Status  07/03/2013 0.85 0.60 - 1.30 mg/dL Final   Creatinine, Ser  Date Value Ref Range Status  08/26/2022 1.22 (H) 0.57 - 1.00 mg/dL Final         Passed - K in normal range and within 180 days    Potassium  Date Value Ref Range Status  08/26/2022 3.5 3.5 - 5.2 mmol/L Final  07/03/2013 3.6 3.5 - 5.1 mmol/L Final         Passed - Na in normal range and within 180 days    Sodium  Date Value Ref Range Status  08/26/2022 141 134 - 144 mmol/L Final  07/03/2013 137 136 - 145 mmol/L Final         Passed - eGFR is 10 or above and within 180 days    EGFR (African American)  Date Value Ref Range Status  07/03/2013 >60  Final   GFR calc Af Amer  Date Value Ref Range Status  08/24/2020 82 >59 mL/min/1.73 Final    Comment:    **In accordance with recommendations from the NKF-ASN Task force,**   [facility] is in the process of updating its eGFR calculation to the   2021 CKD-EPI creatinine equation that estimates kidney function   without a race variable.    EGFR (Non-African Amer.)  Date Value Ref Range Status  07/03/2013 >60  Final    Comment:    eGFR values <40m/min/1.73 m2 may be an indication of chronic kidney disease (CKD). Calculated eGFR is useful in patients with stable renal function. The eGFR calculation will not be reliable in acutely ill patients when serum creatinine is changing rapidly. It is not useful in  patients on dialysis. The eGFR calculation may not be applicable to patients at the low and high extremes of body sizes,  pregnant women, and vegetarians.    GFR calc non Af Amer  Date Value Ref Range Status  08/24/2020 71 >59 mL/min/1.73 Final   eGFR  Date Value Ref Range Status  08/26/2022 55 (L) >59 mL/min/1.73 Final         Passed - Patient is not pregnant      Passed - Last BP in normal range    BP Readings from Last 1 Encounters:  09/29/22 138/86         Passed - Valid encounter within last 6 months    Recent Outpatient Visits           1 week ago [facility]   1 month ago Encounter for general adult medical examination with abnormal findings   [facility]   2 months ago Bilateral chronic knee pain   [facility]   7 months ago Primary hypertension   [facility] [place] [facility], RPH-CPP   8 months ago Stage 3a chronic kidney disease ([place]  [facility], Coralie Keens, [place]

## 2022-10-12 ENCOUNTER — Encounter: Payer: Self-pay | Admitting: Internal Medicine

## 2022-10-12 DIAGNOSIS — G8929 Other chronic pain: Secondary | ICD-10-CM

## 2022-10-24 ENCOUNTER — Ambulatory Visit: Payer: Managed Care, Other (non HMO) | Admitting: Sports Medicine

## 2022-10-24 ENCOUNTER — Encounter: Payer: Self-pay | Admitting: Sports Medicine

## 2022-10-24 ENCOUNTER — Ambulatory Visit: Payer: Self-pay

## 2022-10-24 DIAGNOSIS — M25562 Pain in left knee: Secondary | ICD-10-CM

## 2022-10-24 DIAGNOSIS — M170 Bilateral primary osteoarthritis of knee: Secondary | ICD-10-CM | POA: Diagnosis not present

## 2022-10-24 DIAGNOSIS — M25561 Pain in right knee: Secondary | ICD-10-CM | POA: Diagnosis not present

## 2022-10-24 DIAGNOSIS — G8929 Other chronic pain: Secondary | ICD-10-CM | POA: Diagnosis not present

## 2022-10-24 DIAGNOSIS — Z6841 Body Mass Index (BMI) 40.0 and over, adult: Secondary | ICD-10-CM

## 2022-10-24 MED ORDER — MELOXICAM 15 MG PO TABS: 15.0000 mg | ORAL_TABLET | Freq: Every day | ORAL | 0 refills | Status: DC

## 2022-10-24 NOTE — Patient Instructions (Signed)
1.)  Keep your body moving - keep working on flexibility and range of motion for the knee.  Good physical activity exercise include: stationary bike, swimming, elliptical.  2.)  Maintain a healthy weight --> 1 pound of body weight = 4 pounds of weight on the knees.  Even small weight loss (5-10+ lbs) can significantly improve pain  3.)  Supplements helpful for arthritis and inflammation: Turmeric (curcumin) Boswellia serrata, collagen hydrolysate, Glucosamine-chondroitin  4.)  Foods helpful for arthritis and inflammation: Fish and foods containing omega-3's, leafy greens (broccoli, spinach), citrus fruits (grapefruit, oranges, lemons), green tea, blueberries, cherries, olive oil (EVO)  *It is very important to stay hydrated, drinking lots of water throughout the day. This helps hydrate structures within the knee.  5.) Medicines:    - Topical pain relievers: Voltaren gel, Arnica gel, Tiger balm, lidocaine/IcyHot patches    - Anti-inflammatories like Aleve, Motrin, ibuprofen --> we can consider prescription based NSAIDs as well if needed (*Take Meloxicam once daily)   F/u in about 1 month  - Dr. Rolena Infante

## 2022-10-24 NOTE — Progress Notes (Signed)
Margaret Carlson - 47 y.o. female MRN [id number]  Date of birth: [birth year]  Office Visit Note: Visit Date: 10/24/2022 PCP: Jearld Fenton, NP Referred by: Jearld Fenton, NP  Subjective: Chief Complaint  Patient presents with   Left Knee - Pain   Right Knee - Pain   HPI: Margaret Carlson is a pleasant 47 y.o. female who presents today for bilateral chronic knee pain, R > L.  She has had pain in both of her knees since October 2023.  Denies any specific injury or fall.  She used to be very active doing [employer] and physical activity, but her knee pain has limited this.  Both of her knees bother her, although her right knee has been exacerbated over the last few weeks.  She gets pain with walking, going up and down steps.  At times she will have episodes of giving out of the knees, right greater than left.  She denies any significant swelling.  She does take Tylenol and occasional meloxicam for her pain.  She has not done any formalized physical therapy.  She is inquiring about safe exercises for her to do.  BMI 55.41 kg/m   Pertinent ROS were reviewed with the patient and found to be negative unless otherwise specified above in HPI.   Assessment & Plan: Visit Diagnoses:  1. Primary osteoarthritis of both knees   2. Chronic pain of left knee   3. Chronic pain of right knee   4. Class 3 severe obesity with body mass index (BMI) of 50.0 to 59.9 in adult, unspecified obesity type, unspecified whether serious comorbidity present [facility])    Plan: Discussed with Tai that she has had an exacerbation of her chronic underlying osteoarthritis.  Weightbearing x-rays today demonstrated severe medial joint space narrowing with near bone-on-bone arthritic change.  We discussed all treatment options such as oral medication therapy, formalized physical therapy versus home rehab, healthy weight loss, physical activity measures.  She will continue meloxicam to be taken as needed.  He did want to do  a home exercise program, we did print out a customized handout for her, my athletic trainer, Lilia Pro did show and demonstrate her home rehab exercises in the room today.  She will perform these once daily.  Discussed healthy weight loss measures.  She will also work on physical activity including stationary bike swimming, and elliptical use to help with exercise that are less cumbersome on the knees..  We discussed the role for injection therapy, however she will hold off on this today.  We will follow-up in about 4-5 weeks and reevaluate at that time.  Follow-up: Return in about 1 month (around 11/22/2022) for for bilateral knee pain/OA.   Meds & Orders: No orders of the defined types were placed in this encounter.   Orders Placed This Encounter  Procedures   XR Knee 1-2 Views Right   XR Knee 1-2 Views Left     Procedures: No procedures performed      Clinical History: No specialty comments available.  She reports that she has never smoked. She has never used smokeless tobacco.  Recent Labs    08/26/22 1533  HGBA1C 5.6    Objective:    Physical Exam  Gen: Well-appearing, in no acute distress; non-toxic CV: Well-perfused. Warm.  Resp: Breathing unlabored on room air; no wheezing. Psych: Fluid speech in conversation; appropriate affect; normal thought process Neuro: Sensation intact throughout. No gross coordination deficits.   Ortho Exam - Bilateral  knees: Evaluation of bilateral knees shows no significant effusion or redness.  There is mild valgus deformity of the right greater than left knee.  Range of motion from 0-130 degrees on the left, 0-125 degrees on the right.  Negative modified anterior/posterior drawer.  Negative McMurray's testing.  Strength 5/5 in all directions.  There is knee crepitus bilaterally.  Imaging:  DG Knee Complete 4 Views Right CLINICAL DATA:  Chronic bilateral knee pain right worse than left.  EXAM: RIGHT KNEE - COMPLETE 4+ VIEW  COMPARISON:   10/07/2005  FINDINGS: Exam demonstrates mild tricompartmental osteoarthritic change. No acute fracture or dislocation. No definite joint effusion.  IMPRESSION: 1. No acute findings. 2. Mild tricompartmental osteoarthritic change.  Electronically Signed   By: Marin Olp M.D.   On: 08/08/2022 13:06 DG Knee Complete 4 Views Left CLINICAL DATA:  Chronic bilateral knee pain right worse than left.  EXAM: LEFT KNEE - COMPLETE 4+ VIEW  COMPARISON:  01/16/2018  FINDINGS: Exam demonstrates mild tricompartmental osteoarthritic change. Suggestion of possible joint effusion. No fracture or dislocation.  IMPRESSION: Mild tricompartmental osteoarthritic change. Possible joint effusion.  Electronically Signed   By: Marin Olp M.D.   On: 08/08/2022 13:04  Past Medical/Family/Surgical/Social History: Medications & Allergies reviewed per EMR, new medications updated. Patient Active Problem List   Diagnosis Date Noted   Bilateral chronic knee pain 08/02/2022   CKD (chronic kidney disease) stage 3, GFR 30-59 ml/min (HCC) 01/19/2022   Insomnia 12/28/2021   GERD (gastroesophageal reflux disease) 08/26/2021   Morbid obesity ([place]) 03/04/2021   Anxiety and depression 05/14/2020   Herpes simplex vulvovaginitis 01/14/2020   Mixed hyperlipidemia 06/14/2016   HTN (hypertension) 01/15/2015   Past Medical History:  Diagnosis Date   Allergy    Anxiety    Hyperlipidemia    Hypertension    Family History  Problem Relation Age of Onset   Arthritis Maternal Grandmother    Hypertension Maternal Grandmother    Breast cancer Maternal Grandmother 77   Colon cancer Neg Hx    Colon polyps Neg Hx    Rectal cancer Neg Hx    Stomach cancer Neg Hx    Esophageal cancer Neg Hx    Past Surgical History:  Procedure Laterality Date   LAPAROSCOPIC GASTRIC SLEEVE RESECTION  10/2012   Social History   Occupational History   Not on file  Tobacco Use   Smoking status: Never   Smokeless  tobacco: Never  Vaping Use   Vaping Use: Never used  Substance and Sexual Activity   Alcohol use: Yes    Alcohol/week: 0.0 standard drinks of alcohol    Comment: occasional   Drug use: No   Sexual activity: Yes    Partners: Male    Birth control/protection: Pill

## 2022-10-24 NOTE — Progress Notes (Signed)
Pain since October 2023 No injury  States both "pop" loudly Left has gotten better  Right knee pain; posterior pain There is no swelling Has tried laser therapy; no relief   Meloxicam daily for pain Tylenol PRN   Patient was instructed in 10 minutes of therapeutic exercises for bilateral knee pain to improve strength, ROM and function according to my instructions and plan of care by a Certified Athletic Trainer during the office visit. A customized handout was provided and demonstration of proper technique shown and discussed. Patient did perform exercises and demonstrate understanding through teachback.  All questions discussed and answered.

## 2022-10-27 ENCOUNTER — Other Ambulatory Visit: Payer: Self-pay | Admitting: Internal Medicine

## 2022-10-27 NOTE — Telephone Encounter (Signed)
Requested medication (s) are due for refill today: yes  Requested medication (s) are on the active medication list: yes  Last refill:  08/30/22 #12  Future visit scheduled: no  Notes to clinic:  med not delegated to NT to RF   Requested Prescriptions  Pending Prescriptions Disp Refills   Vitamin D, Ergocalciferol, (DRISDOL) 1.25 MG (50000 UNIT) CAPS capsule [Pharmacy Med Name: Vitamin D (Ergocalciferol) 1.25 MG (50000 UT) Oral Capsule] 12 capsule 3    Sig: TAKE 1 CAPSULE BY MOUTH WEEKLY  FOR 12 WEEKS. THEN START OTC  VITAMIN D3 2,000 UNIT DAILY .     Endocrinology:  Vitamins - Vitamin D Supplementation 2 Failed - 10/27/2022  4:45 AM      Failed - Manual Review: Route requests for 50,000 IU strength to the provider      Failed - Ca in normal range and within 360 days    Calcium  Date Value Ref Range Status  08/26/2022 8.6 (L) 8.7 - 10.2 mg/dL Final   Calcium, Total  Date Value Ref Range Status  07/03/2013 9.0 8.5 - 10.1 mg/dL Final         Failed - Vitamin D in normal range and within 360 days    Vit D, 25-Hydroxy  Date Value Ref Range Status  08/26/2022 15.1 (L) 30.0 - 100.0 ng/mL Final    Comment:    Vitamin D deficiency has been defined by the Institute of Medicine and an Endocrine Society practice guideline as a level of serum 25-OH vitamin D less than 20 ng/mL (1,2). The Endocrine Society went on to further define vitamin D insufficiency as a level between 21 and 29 ng/mL (2). 1. IOM (Institute of Medicine). 2010. Dietary reference    intakes for calcium and D. [place]: The    [employer]. 2. Holick MF, [place], Bischoff-Ferrari HA, et al.    Evaluation, treatment, and prevention of vitamin D    deficiency: an Endocrine Society clinical practice    guideline. JCEM. 2011 Jul; 96(7):1911-30.          Passed - Valid encounter within last 12 months    Recent Outpatient Visits           4 weeks ago Screening for cervical cancer   [facility] [place], Coralie Keens, NP   2 months ago Encounter for general adult medical examination with abnormal findings   [facility] [place], Coralie Keens, NP   2 months ago Bilateral chronic knee pain   [facility] [place], Coralie Keens, NP   8 months ago Primary hypertension   [facility], Grayland Ormond A, RPH-CPP   9 months ago Stage 3a chronic kidney disease [facility])   [facility] [place], Coralie Keens, NP       Future Appointments             In 3 weeks Elba Barman, [place]

## 2022-11-14 ENCOUNTER — Other Ambulatory Visit: Payer: Self-pay | Admitting: Internal Medicine

## 2022-11-15 NOTE — Telephone Encounter (Signed)
Requested Prescriptions  Pending Prescriptions Disp Refills   rosuvastatin (CRESTOR) 10 MG tablet [Pharmacy Med Name: Rosuvastatin Calcium 10 MG Oral Tablet] 90 tablet 3    Sig: TAKE 1 TABLET BY MOUTH DAILY     Cardiovascular:  Antilipid - Statins 2 Failed - 11/14/2022  7:16 AM      Failed - Cr in normal range and within 360 days    Creatinine  Date Value Ref Range Status  07/03/2013 0.85 0.60 - 1.30 mg/dL Final   Creatinine, Ser  Date Value Ref Range Status  08/26/2022 1.22 (H) 0.57 - 1.00 mg/dL Final         Failed - Lipid Panel in normal range within the last 12 months    Cholesterol, Total  Date Value Ref Range Status  08/26/2022 175 100 - 199 mg/dL Final   LDL Chol Calc (NIH)  Date Value Ref Range Status  08/26/2022 110 (H) 0 - 99 mg/dL Final   HDL  Date Value Ref Range Status  08/26/2022 53 >39 mg/dL Final   Triglycerides  Date Value Ref Range Status  08/26/2022 65 0 - 149 mg/dL Final         Passed - Patient is not pregnant      Passed - Valid encounter within last 12 months    Recent Outpatient Visits           1 month ago Screening for cervical cancer   [facility] [place], Coralie Keens, NP   2 months ago Encounter for general adult medical examination with abnormal findings   [facility] [place], Coralie Keens, NP   3 months ago Bilateral chronic knee pain   [facility] [place], Coralie Keens, NP   9 months ago Primary hypertension   [facility] D[facility], Grayland Ormond A, RPH-CPP   10 months ago Stage 3a chronic kidney disease [facility])   [facility] [place], Coralie Keens, NP       Future Appointments             In 1 week Elba Barman, DO [place]             FLUoxetine (PROZAC) 10 MG tablet [Pharmacy Med Name: FLUOXETINE  10MG   TAB] 90 tablet 1    Sig: TAKE 1 TABLET BY MOUTH DAILY     Psychiatry:  Antidepressants  - SSRI Passed - 11/14/2022  7:16 AM      Passed - Completed PHQ-2 or PHQ-9 in the last 360 days      Passed - Valid encounter within last 6 months    Recent Outpatient Visits           1 month ago Screening for cervical cancer   [facility] [place], Coralie Keens, NP   2 months ago Encounter for general adult medical examination with abnormal findings   [facility] [place], Coralie Keens, NP   3 months ago Bilateral chronic knee pain   [facility] [place], Coralie Keens, NP   9 months ago Primary hypertension   [facility], Grayland Ormond A, RPH-CPP   10 months ago Stage 3a chronic kidney disease [facility])   [facility] [place], Coralie Keens, NP       Future Appointments  In 1 week Elba Barman, DO [place]             omeprazole (PRILOSEC) 20 MG capsule [place] Med Name: Omeprazole 20 MG Oral Capsule Delayed Release] 90 capsule 3    Sig: TAKE 1 CAPSULE BY MOUTH DAILY     Gastroenterology: Proton Pump Inhibitors Passed - 11/14/2022  7:16 AM      Passed - Valid encounter within last 12 months    Recent Outpatient Visits           1 month ago Screening for cervical cancer   [facility] [place], Coralie Keens, NP   2 months ago Encounter for general adult medical examination with abnormal findings   [facility] [place], Coralie Keens, NP   3 months ago Bilateral chronic knee pain   [facility] [place], Coralie Keens, NP   9 months ago Primary hypertension   [facility] D[facility], Grayland Ormond A, RPH-CPP   10 months ago Stage 3a chronic kidney disease [facility])   [facility] [place], Coralie Keens, NP       Future Appointments             In 1 week Elba Barman, [place]

## 2022-11-15 NOTE — Telephone Encounter (Signed)
Requested medication (s) are due for refill today - expired Rx  Requested medication (s) are on the active medication list -yes  Future visit scheduled -no  Last refill: 10/20/21 #90 3RF  Notes to clinic: expired Rx  Requested Prescriptions  Pending Prescriptions Disp Refills   rosuvastatin (CRESTOR) 10 MG tablet [Pharmacy Med Name: Rosuvastatin Calcium 10 MG Oral Tablet] 90 tablet 3    Sig: TAKE 1 TABLET BY MOUTH DAILY     Cardiovascular:  Antilipid - Statins 2 Failed - 11/14/2022  7:16 AM      Failed - Cr in normal range and within 360 days    Creatinine  Date Value Ref Range Status  07/03/2013 0.85 0.60 - 1.30 mg/dL Final   Creatinine, Ser  Date Value Ref Range Status  08/26/2022 1.22 (H) 0.57 - 1.00 mg/dL Final         Failed - Lipid Panel in normal range within the last 12 months    Cholesterol, Total  Date Value Ref Range Status  08/26/2022 175 100 - 199 mg/dL Final   LDL Chol Calc (NIH)  Date Value Ref Range Status  08/26/2022 110 (H) 0 - 99 mg/dL Final   HDL  Date Value Ref Range Status  08/26/2022 53 >39 mg/dL Final   Triglycerides  Date Value Ref Range Status  08/26/2022 65 0 - 149 mg/dL Final         Passed - Patient is not pregnant      Passed - Valid encounter within last 12 months    Recent Outpatient Visits           1 month ago Screening for cervical cancer   [place], Coralie Keens, NP   2 months ago Encounter for general adult medical examination with abnormal findings   [facility] [place], Coralie Keens, NP   3 months ago Bilateral chronic knee pain   [facility] [place], Coralie Keens, NP   9 months ago Primary hypertension   [facility], Grayland Ormond A, RPH-CPP   10 months ago Stage 3a chronic kidney disease [facility])   [facility] [place], Coralie Keens, NP       Future Appointments             In 1 week  Elba Barman, DO [place]            Signed Prescriptions Disp Refills   FLUoxetine (PROZAC) 10 MG tablet 90 tablet 1    Sig: TAKE 1 TABLET BY MOUTH DAILY     Psychiatry:  Antidepressants - SSRI Passed - 11/14/2022  7:16 AM      Passed - Completed PHQ-2 or PHQ-9 in the last 360 days      Passed - Valid encounter within last 6 months    Recent Outpatient Visits           1 month ago Screening for cervical cancer   [facility] [place], Coralie Keens, NP   2 months ago Encounter for general adult medical examination with abnormal findings   [facility] [place], Coralie Keens, NP   3 months ago Bilateral chronic knee pain   [facility] [place], Coralie Keens, NP   9 months ago Primary hypertension   [facility], Grayland Ormond A, RPH-CPP  10 months ago Stage 3a chronic kidney disease [facility])   [facility] [place], Coralie Keens, NP       Future Appointments             In 1 week Elba Barman, DO [place]             omeprazole (PRILOSEC) 20 MG capsule 90 capsule 3    Sig: TAKE 1 CAPSULE BY MOUTH DAILY     Gastroenterology: Proton Pump Inhibitors Passed - 11/14/2022  7:16 AM      Passed - Valid encounter within last 12 months    Recent Outpatient Visits           1 month ago Screening for cervical cancer   [facility] [place], [place] W, NP   2 months ago Encounter for general adult medical examination with abnormal findings   [facility] [place], Coralie Keens, NP   3 months ago Bilateral chronic knee pain   [facility] [place], Coralie Keens, NP   9 months ago Primary hypertension   [facility], Grayland Ormond A, RPH-CPP   10 months ago Stage 3a chronic kidney disease [facility])   [place], Coralie Keens, NP       Future Appointments             In 1 week Elba Barman, DO [place]               Requested Prescriptions  Pending Prescriptions Disp Refills   rosuvastatin (CRESTOR) 10 MG tablet [Pharmacy Med Name: Rosuvastatin Calcium 10 MG Oral Tablet] 90 tablet 3    Sig: TAKE 1 TABLET BY MOUTH DAILY     Cardiovascular:  Antilipid - Statins 2 Failed - 11/14/2022  7:16 AM      Failed - Cr in normal range and within 360 days    Creatinine  Date Value Ref Range Status  07/03/2013 0.85 0.60 - 1.30 mg/dL Final   Creatinine, Ser  Date Value Ref Range Status  08/26/2022 1.22 (H) 0.57 - 1.00 mg/dL Final         Failed - Lipid Panel in normal range within the last 12 months    Cholesterol, Total  Date Value Ref Range Status  08/26/2022 175 100 - 199 mg/dL Final   LDL Chol Calc (NIH)  Date Value Ref Range Status  08/26/2022 110 (H) 0 - 99 mg/dL Final   HDL  Date Value Ref Range Status  08/26/2022 53 >39 mg/dL Final   Triglycerides  Date Value Ref Range Status  08/26/2022 65 0 - 149 mg/dL Final         Passed - Patient is not pregnant      Passed - Valid encounter within last 12 months    Recent Outpatient Visits           1 month ago Screening for cervical cancer   [place], Coralie Keens, NP   2 months ago Encounter for general adult medical examination with abnormal findings   [facility] [place], Coralie Keens, NP   3 months ago Bilateral chronic knee pain   [facility] [place], Coralie Keens, NP   9 months ago Primary hypertension   [facility], Grayland Ormond A,  RPH-CPP   10 months ago Stage 3a chronic kidney disease [facility])   [facility] [place], Coralie Keens, NP       Future Appointments             In 1 week Elba Barman, DO [place]             Signed Prescriptions Disp Refills   FLUoxetine (PROZAC) 10 MG tablet 90 tablet 1    Sig: TAKE 1 TABLET BY MOUTH DAILY     Psychiatry:  Antidepressants - SSRI Passed - 11/14/2022  7:16 AM      Passed - Completed PHQ-2 or PHQ-9 in the last 360 days      Passed - Valid encounter within last 6 months    Recent Outpatient Visits           1 month ago Screening for cervical cancer   [facility] [place], Coralie Keens, NP   2 months ago Encounter for general adult medical examination with abnormal findings   [facility] [place], Coralie Keens, NP   3 months ago Bilateral chronic knee pain   [facility] [place], Coralie Keens, NP   9 months ago Primary hypertension   [facility], Grayland Ormond A, RPH-CPP   10 months ago Stage 3a chronic kidney disease [facility])   [facility] [place], Coralie Keens, NP       Future Appointments             In 1 week Elba Barman, DO [place]             omeprazole (PRILOSEC) 20 MG capsule 90 capsule 3    Sig: TAKE 1 CAPSULE BY MOUTH DAILY     Gastroenterology: Proton Pump Inhibitors Passed - 11/14/2022  7:16 AM      Passed - Valid encounter within last 12 months    Recent Outpatient Visits           1 month ago Screening for cervical cancer   [facility] [place], Coralie Keens, NP   2 months ago Encounter for general adult medical examination with abnormal findings   [facility] [place], Coralie Keens, NP   3 months ago Bilateral chronic knee pain   [facility] [place], Coralie Keens, NP   9 months ago Primary hypertension   [facility], Grayland Ormond A, RPH-CPP   10 months ago Stage 3a chronic kidney disease [facility])   [facility] [place], Coralie Keens, NP       Future Appointments              In 1 week Elba Barman, [place]

## 2022-11-22 ENCOUNTER — Encounter: Payer: Self-pay | Admitting: Sports Medicine

## 2022-11-22 ENCOUNTER — Ambulatory Visit: Payer: Managed Care, Other (non HMO) | Admitting: Sports Medicine

## 2022-11-22 DIAGNOSIS — Z6841 Body Mass Index (BMI) 40.0 and over, adult: Secondary | ICD-10-CM

## 2022-11-22 DIAGNOSIS — M170 Bilateral primary osteoarthritis of knee: Secondary | ICD-10-CM

## 2022-11-22 NOTE — Progress Notes (Signed)
Margaret Carlson - 47 y.o. female MRN [id number]  Date of birth: Apr 16, 1976  Office Visit Note: Visit Date: 11/22/2022 PCP: Margaret Fenton, NP Referred by: Margaret Fenton, NP  Subjective: Chief Complaint  Patient presents with   Left Knee - Follow-up   Right Knee - Follow-up   HPI: Margaret Carlson is a pleasant 47 y.o. female who presents today for follow-up of chronic knee pain, R > L.   Has bilateral medial knee osteoarthritis. Feels like she is 95% better with the home exercise program and the daily meloxicam. Doing HEP 2-3/weekly She still has some stiffness in the knees, but this gets better after she gets up and gets moving.  Does not have any sharp pain in the knees anymore.  She has been taking care of her mother who recently had a partial knee replacement.  Has been able to get outdoors more, playing softball with her knees.  Pertinent ROS were reviewed with the patient and found to be negative unless otherwise specified above in HPI.   Assessment & Plan: Visit Diagnoses:  1. Primary osteoarthritis of both knees   2. Class 3 severe obesity with body mass index (BMI) of 50.0 to 59.9 in adult, unspecified obesity type, unspecified whether serious comorbidity present [facility])    Plan: Both [name] and I are very pleased with the improvement she has had with her knees with her home exercise therapy and meloxicam medicine.  Is that she will continue her home exercises 2-3 times weekly.  She is going to work on weight loss as well.  She has a Higher education careers adviser at [employer] and will begin aquatic-based therapy.  Also recommended stationary bicycle and elliptical use.  She will start transitioning meloxicam to be taken only as needed.  Follow-up with me as needed.  Follow-up: Return if symptoms worsen or fail to improve.   Meds & Orders: No orders of the defined types were placed in this encounter.  No orders of the defined types were placed in this encounter.    Procedures: No  procedures performed      Clinical History: No specialty comments available.  She reports that she has never smoked. She has never used smokeless tobacco.  Recent Labs    08/26/22 1533  HGBA1C 5.6    Objective:    Physical Exam  Gen: Well-appearing, in no acute distress; non-toxic CV: Well-perfused. Warm.  Resp: Breathing unlabored on room air; no wheezing. Psych: Fluid speech in conversation; appropriate affect; normal thought process Neuro: Sensation intact throughout. No gross coordination deficits.   Ortho Exam - Bilateral knees: Evaluation of bilateral knees shows no significant effusion or redness.  There is mild valgus deformity of the right greater than left knee.  Range of motion from 0-130 degrees on the left, 0-125 degrees on the right.  Negative modified anterior/posterior drawer.  Negative McMurray's testing.  Strength 5/5 in all directions.  There is knee crepitus bilaterally, but to a lesser degree than last examination.   Imaging: No results found.  Past Medical/Family/Surgical/Social History: Medications & Allergies reviewed per EMR, new medications updated. Patient Active Problem List   Diagnosis Date Noted   Bilateral chronic knee pain 08/02/2022   CKD (chronic kidney disease) stage 3, GFR 30-59 ml/min (HCC) 01/19/2022   Insomnia 12/28/2021   GERD (gastroesophageal reflux disease) 08/26/2021   Morbid obesity ([place]) 03/04/2021   Anxiety and depression 05/14/2020   Herpes simplex vulvovaginitis 01/14/2020   Mixed hyperlipidemia 06/14/2016   HTN (hypertension)  01/15/2015   Past Medical History:  Diagnosis Date   Allergy    Anxiety    Hyperlipidemia    Hypertension    Family History  Problem Relation Age of Onset   Arthritis Maternal Grandmother    Hypertension Maternal Grandmother    Breast cancer Maternal Grandmother 77   Colon cancer Neg Hx    Colon polyps Neg Hx    Rectal cancer Neg Hx    Stomach cancer Neg Hx    Esophageal cancer Neg Hx     Past Surgical History:  Procedure Laterality Date   LAPAROSCOPIC GASTRIC SLEEVE RESECTION  10/2012   Social History   Occupational History   Not on file  Tobacco Use   Smoking status: Never   Smokeless tobacco: Never  Vaping Use   Vaping Use: Never used  Substance and Sexual Activity   Alcohol use: Yes    Alcohol/week: 0.0 standard drinks of alcohol    Comment: occasional   Drug use: No   Sexual activity: Yes    Partners: Male    Birth control/protection: Pill

## 2022-11-22 NOTE — Progress Notes (Signed)
Doing better; states she is doing her HEP with relief. She is also on meloxicam which does help  Not having the sharp pain in knees anymore

## 2022-12-10 ENCOUNTER — Other Ambulatory Visit: Payer: Self-pay | Admitting: Internal Medicine

## 2022-12-10 DIAGNOSIS — Z01419 Encounter for gynecological examination (general) (routine) without abnormal findings: Secondary | ICD-10-CM

## 2022-12-12 NOTE — Telephone Encounter (Signed)
Requested medication (s) are due for refill today: yes  Requested medication (s) are on the active medication list: yes  Last refill:  10/11/21  Future visit scheduled: yes  Notes to clinic:  Unable to refill per protocol, no protocol, routing for review.      Requested Prescriptions  Pending Prescriptions Disp Refills   amLODipine (NORVASC) 10 MG tablet [Pharmacy Med Name: amLODIPine Besylate 10 MG Oral Tablet] 90 tablet 3    Sig: TAKE 1 TABLET BY MOUTH  DAILY     There is no refill protocol information for this order

## 2022-12-26 ENCOUNTER — Other Ambulatory Visit: Payer: Self-pay | Admitting: Internal Medicine

## 2022-12-27 NOTE — Telephone Encounter (Signed)
Requested medication (s) are due for refill today: Yes  Requested medication (s) are on the active medication list: Yes  Last refill:  10/24/22  Future visit scheduled: No  Notes to clinic:  Last filled by Dr. Shon Baton.    Requested Prescriptions  Pending Prescriptions Disp Refills   meloxicam (MOBIC) 15 MG tablet [Pharmacy Med Name: MELOXICAM 15MG  TABLETS] 90 tablet     Sig: TAKE 1 TABLET(15 MG) BY MOUTH DAILY     Analgesics:  COX2 Inhibitors Failed - 12/26/2022  3:33 AM      Failed - Manual Review: Labs are only required if the patient has taken medication for more than 8 weeks.      Failed - Cr in normal range and within 360 days    Creatinine  Date Value Ref Range Status  07/03/2013 0.85 0.60 - 1.30 mg/dL Final   Creatinine, Ser  Date Value Ref Range Status  08/26/2022 1.22 (H) 0.57 - 1.00 mg/dL Final         Passed - HGB in normal range and within 360 days    Hemoglobin  Date Value Ref Range Status  08/26/2022 11.8 11.1 - 15.9 g/dL Final         Passed - HCT in normal range and within 360 days    Hematocrit  Date Value Ref Range Status  08/26/2022 36.2 34.0 - 46.6 % Final         Passed - AST in normal range and within 360 days    AST  Date Value Ref Range Status  08/26/2022 16 0 - 40 IU/L Final   SGOT(AST)  Date Value Ref Range Status  07/03/2013 15 15 - 37 Unit/L Final         Passed - ALT in normal range and within 360 days    ALT  Date Value Ref Range Status  08/26/2022 10 0 - 32 IU/L Final   SGPT (ALT)  Date Value Ref Range Status  07/03/2013 16 12 - 78 U/L Final         Passed - eGFR is 30 or above and within 360 days    EGFR (African American)  Date Value Ref Range Status  07/03/2013 >60  Final   GFR calc Af Amer  Date Value Ref Range Status  08/24/2020 82 >59 mL/min/1.73 Final    Comment:    **In accordance with recommendations from the NKF-ASN Task force,**   [facility] is in the process of updating its eGFR calculation to the   2021  CKD-EPI creatinine equation that estimates kidney function   without a race variable.    EGFR (Non-African Amer.)  Date Value Ref Range Status  07/03/2013 >60  Final    Comment:    eGFR values <41mL/min/1.73 m2 may be an indication of chronic kidney disease (CKD). Calculated eGFR is useful in patients with stable renal function. The eGFR calculation will not be reliable in acutely ill patients when serum creatinine is changing rapidly. It is not useful in  patients on dialysis. The eGFR calculation may not be applicable to patients at the low and high extremes of body sizes, pregnant women, and vegetarians.    GFR calc non Af Amer  Date Value Ref Range Status  08/24/2020 71 >59 mL/min/1.73 Final   eGFR  Date Value Ref Range Status  08/26/2022 55 (L) >59 mL/min/1.73 Final         Passed - Patient is not pregnant      Passed - Valid  encounter within last 12 months    Recent Outpatient Visits           2 months ago Screening for cervical cancer   [facility] [place], Salvadore [place], NP   4 months ago Encounter for general adult medical examination with abnormal findings   [facility] [place], Salvadore [place], NP   4 months ago Bilateral chronic knee pain   [place] [place], Salvadore [place], NP   10 months ago Primary hypertension   [facility] [place], Gentry Fitz A, RPH-CPP   11 months ago Stage 3a chronic kidney disease [facility])   [facility] [place], Salvadore [place], [place]

## 2023-02-08 ENCOUNTER — Encounter: Payer: Self-pay | Admitting: Internal Medicine

## 2023-02-08 ENCOUNTER — Ambulatory Visit: Payer: Managed Care, Other (non HMO) | Admitting: Internal Medicine

## 2023-02-08 VITALS — BP 150/96 | HR 79 | Temp 97.1°F | Wt 345.0 lb

## 2023-02-08 DIAGNOSIS — R7309 Other abnormal glucose: Secondary | ICD-10-CM | POA: Diagnosis not present

## 2023-02-08 DIAGNOSIS — F32A Depression, unspecified: Secondary | ICD-10-CM

## 2023-02-08 DIAGNOSIS — G8929 Other chronic pain: Secondary | ICD-10-CM

## 2023-02-08 DIAGNOSIS — E559 Vitamin D deficiency, unspecified: Secondary | ICD-10-CM | POA: Diagnosis not present

## 2023-02-08 DIAGNOSIS — I10 Essential (primary) hypertension: Secondary | ICD-10-CM

## 2023-02-08 DIAGNOSIS — M25562 Pain in left knee: Secondary | ICD-10-CM

## 2023-02-08 DIAGNOSIS — E782 Mixed hyperlipidemia: Secondary | ICD-10-CM | POA: Diagnosis not present

## 2023-02-08 DIAGNOSIS — F5101 Primary insomnia: Secondary | ICD-10-CM

## 2023-02-08 DIAGNOSIS — F419 Anxiety disorder, unspecified: Secondary | ICD-10-CM

## 2023-02-08 DIAGNOSIS — A6004 Herpesviral vulvovaginitis: Secondary | ICD-10-CM

## 2023-02-08 DIAGNOSIS — M25561 Pain in right knee: Secondary | ICD-10-CM

## 2023-02-08 DIAGNOSIS — K219 Gastro-esophageal reflux disease without esophagitis: Secondary | ICD-10-CM

## 2023-02-08 NOTE — Assessment & Plan Note (Signed)
Avoid foods that trigger reflux Encourage weight loss as this can help reduce reflux symptoms Continue omeprazole 

## 2023-02-08 NOTE — Assessment & Plan Note (Signed)
Stable on her current dose of fluoxetine She declines transitioning this to paroxetine for treatment of perimenopausal symptoms Support offered

## 2023-02-08 NOTE — Assessment & Plan Note (Signed)
Continue melatonin or ZzzQuil OTC She is not interested in a prescription sleep aid at this time

## 2023-02-08 NOTE — Assessment & Plan Note (Signed)
Blood pressure elevated today She does not want to adjust her losartan, HCTZ or amlodipine at this time Reinforced DASH diet and exercise for weight loss C-Met today

## 2023-02-08 NOTE — Assessment & Plan Note (Signed)
Encourage diet and exercise for weight loss 

## 2023-02-08 NOTE — Assessment & Plan Note (Signed)
C-Met and lipid profile today  Encouraged her to consume a low-fat diet Continue rosuvastatin 

## 2023-02-08 NOTE — Patient Instructions (Signed)
Cooking With Less Salt Cooking with less salt is one way to reduce the amount of salt (sodium) you get from food. Most people should have less than 2,300 milligrams (mg) of sodium each day. If you have high blood pressure (hypertension), you may need to limit your sodium to 1,500 mg each day. Follow the tips below to help reduce your sodium intake. What are tips for eating less sodium? Reading food labels  Check the food label before buying or using packaged ingredients. Always check the label for the serving size and sodium content. Choose products with less than 140 mg of sodium per serving. Check the % Daily Value column to see what percent of the daily recommended amount of sodium is in one serving of the product. Foods with 5% or less are low in sodium. Foods with 20% or more are high in sodium. Do not choose foods that have salt as one of the first three ingredients on the ingredients list. Always check how much sodium is in a product, even if the label says "unsalted" or "no salt added." Shopping Buy sodium-free or low-sodium products. Look for these words: Low-sodium. Sodium-free. Reduced-sodium. No salt added. Unsalted. Buy fresh or frozen foods without sauces or additives. Cooking Instead of salt, use herbs, seasonings without salt, and spices. Use sodium-free baking soda. Grill, braise, or roast foods to add flavor with less salt. Do not add salt to pasta, rice, or hot cereals. Drain and rinse canned vegetables, beans, and meat before use. Do not add salt when cooking sweets and desserts. Cook with low-sodium ingredients. Meal planning The sodium in bread can add up. Try to plan meals with other grains. These may include whole oats, quinoa, whole wheat pasta, and other whole grains that do not have sodium added to them. What foods are high in sodium? Vegetables Regular canned vegetables, except low-sodium or reduced-sodium items. Sauerkraut, pickled vegetables, and relishes.  Olives. French fries. Onion rings. Regular canned tomato sauce and paste. Regular tomato and vegetable juice. Frozen vegetables in sauces. Grains Instant hot cereals. Bread stuffing, pancake, and biscuit mixes. Croutons. Seasoned rice or pasta mixes. Noodle soup cups. Boxed or frozen macaroni and cheese. Regular salted crackers. Self-rising flour. Rolls. Bagels. Flour tortillas and wraps. Meats and other proteins Meat or fish that is salted, canned, smoked, cured, spiced, or pickled. Precooked or cured meat, such as sausages or meat loaves. Bacon. Ham. Pepperoni. Hot dogs. Corned beef. Chipped beef. Salt pork. Jerky. Pickled herring, anchovies, and sardines. Regular canned tuna. Salted nuts. Dairy Processed cheese and cheese spreads. Hard cheeses. Cheese curds. Blue cheese. Feta cheese. String cheese. Regular cottage cheese. Buttermilk. Canned milk. The items listed above may not be a full list of foods high in sodium. Talk to a dietitian to learn more. What foods are low in sodium? Fruits Fresh, frozen, or canned fruit with no sauce added. Fruit juice. Vegetables Fresh or frozen vegetables with no sauce added. "No salt added" canned vegetables. "No salt added" tomato sauce and paste. Low-sodium or reduced-sodium tomato and vegetable juice. Grains Noodles, pasta, quinoa, rice. Shredded or puffed wheat or puffed rice. Regular or quick oats (not instant). Low-sodium crackers. Low-sodium bread. Whole grain bread and whole grain pasta. Unsalted popcorn. Meats and other proteins Fresh or frozen whole meats, poultry that has not been injected with sodium, and fish with no sauce added. Unsalted nuts. Dried peas, beans, and lentils without added salt. Unsalted canned beans. Eggs. Unsalted nut butters. Low-sodium canned tuna or chicken. Dairy   Milk. Soy milk. Yogurt. Low-sodium cheeses, such as Swiss, Monterey Jack, mozzarella, and ricotta. Sherbet or ice cream (keep to  cup per serving). Cream  cheese. Fats and oils Unsalted butter or margarine. Other foods Homemade pudding. Sodium-free baking soda and baking powder. Herbs and spices. Low-sodium seasoning mixes. Beverages Coffee and tea. Carbonated beverages. The items listed above may not be a full list of foods low in sodium. Talk to a dietitian to learn more. What are some salt alternatives when cooking? Herbs, seasonings, and spices can be used instead of salt to flavor your food. Herbs should be fresh or dried. Do not choose packaged mixes. Next to the name of the herb, spice, or seasoning below are some foods you can pair it with. Herbs Bay leaves - Soups, meat and vegetable dishes, and spaghetti sauce. Basil - Italian dishes, soups, pasta, and fish dishes. Cilantro - Meat, poultry, and vegetable dishes. Chili powder - Marinades and Mexican dishes. Chives - Salad dressings and potato dishes. Cumin - Mexican dishes, couscous, and meat dishes. Dill - Fish dishes, sauces, and salads. Fennel - Meat and vegetable dishes, breads, and cookies. Garlic (do not use garlic salt) - Italian dishes, meat dishes, salad dressings, and sauces. Marjoram - Soups, potato dishes, and meat dishes. Oregano - Pizza and spaghetti sauce. Parsley - Salads, soups, pasta, and meat dishes. Rosemary - Italian dishes, salad dressings, soups, and red meats. Saffron - Fish dishes, pasta, and some poultry dishes. Sage - Stuffings and sauces. Tarragon - Fish and poultry dishes. Thyme - Stuffing, meat, and fish dishes. Seasonings Lemon juice - Fish dishes, poultry dishes, vegetables, and salads. Vinegar - Salad dressings, vegetables, and fish dishes. Spices Cinnamon - Sweet dishes, such as cakes, cookies, and puddings. Cloves - Gingerbread, puddings, and marinades for meats. Curry - Vegetable dishes, fish and poultry dishes, and stir-fry dishes. Ginger - Vegetable dishes, fish dishes, and stir-fry dishes. Nutmeg - Pasta, vegetables, poultry, fish  dishes, and custard. This information is not intended to replace advice given to you by your health care provider. Make sure you discuss any questions you have with your health care provider. Document Revised: 09/08/2022 Document Reviewed: 09/01/2022 Elsevier Patient Education  2024 [employer]  

## 2023-02-08 NOTE — Progress Notes (Signed)
Subjective:    Patient ID: Margaret Carlson, female    DOB: 1976/08/29, 47 y.o.   MRN: [id number]  HPI  Patient presents to clinic today for 67-month follow-up of chronic conditions.  HTN: Her BP today is 148/92.  She is taking Losartan HCT and Amlodipine as prescribed.  ECG from 02/2015 reviewed.  HLD: Her last LDL was 110, triglycerides 65, 07/2022.  She denies myalgias on Rosuvastatin.  She tries to consume a low-fat diet.  Anxiety and Depression: Chronic, managed on Fluoxetine. She in unable to tolerate Venlafaxine. She is not currently seeing a therapist.  She denies SI/HI.  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  She takes Meloxicam as prescribed.  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  She has Valacyclovir to take as needed.  GERD: She is not sure what triggers this.  She denies breakthrough on Omeprazole.  There is no upper GI on file.  General Herpes: She is not currently taking any medications for this.  She denies recent outbreak.  Insomnia: She has difficulty staying asleep.  She takes Melatonin or ZzzQuil as needed.  There is no sleep study on file.   She also reports loss of appetite, worsening insomnia. She is not having hot flashes or night sweats. She continue to have regular periods on OCP's.  Review of Systems     Past Medical History:  Diagnosis Date   Allergy    Anxiety    Hyperlipidemia    Hypertension     Current Outpatient Medications  Medication Sig Dispense Refill   acyclovir (ZOVIRAX) 400 MG tablet Take 1 tablet (400 mg total) by mouth 3 (three) times daily as needed (Take for 5 days for an outbreak). 90 tablet 0   amLODipine (NORVASC) 10 MG tablet TAKE 1 TABLET BY MOUTH DAILY 90 tablet 0   Calcium-Vitamin D-Vitamin K (CALCIUM + D + K PO) Take 1 capsule by mouth daily.     cyanocobalamin 2000 MCG tablet Take 2,000 mcg by mouth daily.     FLUoxetine (PROZAC) 10 MG tablet TAKE 1 TABLET BY MOUTH DAILY 90 tablet  1   losartan (COZAAR) 100 MG tablet Take 1 tablet (100 mg total) by mouth daily. 90 tablet 1   meloxicam (MOBIC) 15 MG tablet Take 1 tablet (15 mg total) by mouth daily. 60 tablet 0   Multiple Vitamin (MULTIVITAMIN) tablet Take 1 tablet by mouth daily.     Norethindrone Acetate-Ethinyl Estradiol (JUNEL 1.5/30) 1.5-30 MG-MCG tablet TAKE 1 TABLET BY MOUTH  DAILY FOR 3 WEEKS THEN OFF  FOR 1 WEEK 63 tablet 3   omeprazole (PRILOSEC) 20 MG capsule TAKE 1 CAPSULE BY MOUTH DAILY 90 capsule 3   rosuvastatin (CRESTOR) 10 MG tablet TAKE 1 TABLET BY MOUTH DAILY 90 tablet 1   Vitamin D, Ergocalciferol, (DRISDOL) 1.25 MG (50000 UNIT) CAPS capsule TAKE 1 CAPSULE BY MOUTH WEEKLY  FOR 12 WEEKS. THEN START OTC  VITAMIN D3 2,000 UNIT DAILY . 12 capsule 1   No current facility-administered medications for this visit.    Allergies  Allergen Reactions   Ranitidine Swelling    Face and lip swelling    Family History  Problem Relation Age of Onset   Arthritis Maternal Grandmother    Hypertension Maternal Grandmother    Breast cancer Maternal Grandmother 91   Colon cancer Neg Hx    Colon polyps Neg Hx    Rectal cancer Neg Hx    Stomach  cancer Neg Hx    Esophageal cancer Neg Hx     Social History   Socioeconomic History   Marital status: Single    Spouse name: Not on file   Number of children: Not on file   Years of education: Not on file   Highest education level: Bachelor's degree (e.g., BA, AB, BS)  Occupational History   Not on file  Tobacco Use   Smoking status: Never   Smokeless tobacco: Never  Vaping Use   Vaping Use: Never used  Substance and Sexual Activity   Alcohol use: Yes    Alcohol/week: 0.0 standard drinks of alcohol    Comment: occasional   Drug use: No   Sexual activity: Yes    Partners: Male    Birth control/protection: Pill  Other Topics Concern   Not on file  Social History Narrative   Not on file   Social Determinants of Health   Financial Resource Strain: Low  Risk  (02/07/2023)   Overall Financial Resource Strain (CARDIA)    Difficulty of Paying Living Expenses: Not hard at all  Food Insecurity: No Food Insecurity (02/07/2023)   Hunger Vital Sign    Worried About Running Out of Food in the Last Year: Never true    Ran Out of Food in the Last Year: Never true  Transportation Needs: No Transportation Needs (02/07/2023)   PRAPARE - Administrator, Civil Service (Medical): No    Lack of Transportation (Non-Medical): No  Physical Activity: Insufficiently Active (02/07/2023)   Exercise Vital Sign    Days of Exercise per Week: 1 day    Minutes of Exercise per Session: 10 min  Stress: No Stress Concern Present (02/07/2023)   [employer] of Occupational Health - Occupational Stress Questionnaire    Feeling of Stress : Not at all  Social Connections: Moderately Integrated (02/07/2023)   Social Connection and Isolation Panel [NHANES]    Frequency of Communication with Friends and Family: More than three times a week    Frequency of Social Gatherings with Friends and Family: Once a week    Attends Religious Services: More than 4 times per year    Active Member of [employer] or Organizations: Yes    Attends Banker Meetings: 1 to 4 times per year    Marital Status: Never married  Intimate Partner Violence: Not on file     Constitutional: Denies fever, malaise, fatigue, headache or abrupt weight changes.  HEENT: Denies eye pain, eye redness, ear pain, ringing in the ears, wax buildup, runny nose, nasal congestion, bloody nose, or sore throat. Respiratory: Denies difficulty breathing, shortness of breath, cough or sputum production.   Cardiovascular: Denies chest pain, chest tightness, palpitations or swelling in the hands or feet.  Gastrointestinal: Denies abdominal pain, bloating, constipation, diarrhea or blood in the stool.  GU: Denies urgency, frequency, pain with urination, burning sensation, blood in urine, odor or  discharge. Musculoskeletal: Patient reports knee pain.  Denies decrease in range of motion, difficulty with gait, muscle pain or joint swelling.  Skin: Denies redness, rashes, lesions or ulcercations.  Neurological: Patient reports insomnia.  Denies dizziness, difficulty with memory, difficulty with speech or problems with balance and coordination.  Psych: Patient has a history of anxiety and depression.  Denies SI/HI.  No other specific complaints in a complete review of systems (except as listed in HPI above).  Objective:   Physical Exam  BP (!) 150/96 (BP Location: Left Arm, Patient Position: Sitting, Cuff  Size: Large)   Pulse 79   Temp (!) 97.1 F (36.2 C) (Temporal)   Wt (!) 345 lb (156.5 kg)   SpO2 99%   BMI 57.41 kg/m   Wt Readings from Last 3 Encounters:  09/29/22 (!) 333 lb (151 kg)  08/25/22 (!) 337 lb (152.9 kg)  08/02/22 (!) 334 lb (151.5 kg)    General: Appears her stated age, obese, in NAD. Skin: Warm, dry and intact.  HEENT: Head: normal shape and size; Eyes: sclera white, no icterus, conjunctiva pink, PERRLA and EOMs intact;  Cardiovascular: Normal rate and rhythm. S1,S2 noted.  No murmur, rubs or gallops noted. No JVD or BLE edema.  Pulmonary/Chest: Normal effort and positive vesicular breath sounds. No respiratory distress. No wheezes, rales or ronchi noted.  Abdomen: Normal bowel sounds.  Musculoskeletal: No difficulty with gait.  Neurological: Alert and oriented. Coordination normal.  Psychiatric: Mood and affect normal. Behavior is normal. Judgment and thought content normal.    BMET    Component Value Date/Time   NA 141 08/26/2022 1533   NA 137 07/03/2013 2132   K 3.5 08/26/2022 1533   K 3.6 07/03/2013 2132   CL 103 08/26/2022 1533   CL 103 07/03/2013 2132   CO2 27 08/26/2022 1533   CO2 29 07/03/2013 2132   GLUCOSE 109 (H) 08/26/2022 1533   GLUCOSE 118 (H) 06/18/2018 1756   GLUCOSE 90 07/03/2013 2132   BUN 15 08/26/2022 1533   BUN 9 07/03/2013  2132   CREATININE 1.22 (H) 08/26/2022 1533   CREATININE 0.85 07/03/2013 2132   CALCIUM 8.6 (L) 08/26/2022 1533   CALCIUM 9.0 07/03/2013 2132   GFRNONAA 71 08/24/2020 1626   GFRNONAA >60 07/03/2013 2132   GFRAA 82 08/24/2020 1626   GFRAA >60 07/03/2013 2132    Lipid Panel     Component Value Date/Time   CHOL 175 08/26/2022 1533   TRIG 65 08/26/2022 1533   HDL 53 08/26/2022 1533   CHOLHDL 3.3 08/26/2022 1533   LDLCALC 110 (H) 08/26/2022 1533    CBC    Component Value Date/Time   WBC 6.8 08/26/2022 1533   WBC 7.7 06/18/2018 1756   RBC 4.99 08/26/2022 1533   RBC 6.05 (H) 06/18/2018 1756   HGB 11.8 08/26/2022 1533   HCT 36.2 08/26/2022 1533   PLT 300 08/26/2022 1533   MCV 73 (L) 08/26/2022 1533   MCV 73 (L) 07/03/2013 2132   MCH 23.6 (L) 08/26/2022 1533   MCH 23.0 (L) 06/18/2018 1756   MCHC 32.6 08/26/2022 1533   MCHC 33.1 06/18/2018 1756   RDW 19.9 (H) 08/26/2022 1533   RDW 19.1 (H) 07/03/2013 2132   LYMPHSABS 2.5 03/17/2015 1618   EOSABS 0.1 03/17/2015 1618   BASOSABS 0.0 03/17/2015 1618    Hgb A1C Lab Results  Component Value Date   HGBA1C 5.6 08/26/2022           Assessment & Plan:      RTC in 6 months for your annual exam Nicki Reaper, NP

## 2023-02-08 NOTE — Assessment & Plan Note (Signed)
Continue meloxicam Encourage weight loss as this can help reduce knee pain

## 2023-02-08 NOTE — Assessment & Plan Note (Addendum)
She is not currently taking any medication for this.

## 2023-02-11 LAB — COMPREHENSIVE METABOLIC PANEL
ALT: 6 IU/L (ref 0–32)
AST: 11 IU/L (ref 0–40)
Albumin: 3.5 g/dL — ABNORMAL LOW (ref 3.9–4.9)
Alkaline Phosphatase: 72 IU/L (ref 44–121)
BUN/Creatinine Ratio: 12 (ref 9–23)
BUN: 13 mg/dL (ref 6–24)
Bilirubin Total: <0.2 mg/dL (ref 0.0–1.2)
CO2: 22 mmol/L (ref 20–29)
Calcium: 8.4 mg/dL — ABNORMAL LOW (ref 8.7–10.2)
Chloride: 104 mmol/L (ref 96–106)
Creatinine, Ser: 1.07 mg/dL — ABNORMAL HIGH (ref 0.57–1.00)
Globulin, Total: 3.1 g/dL (ref 1.5–4.5)
Glucose: 72 mg/dL (ref 70–99)
Potassium: 4 mmol/L (ref 3.5–5.2)
Sodium: 138 mmol/L (ref 134–144)
Total Protein: 6.6 g/dL (ref 6.0–8.5)
eGFR: 64 mL/min/{1.73_m2} (ref >59)

## 2023-02-11 LAB — CBC
Hematocrit: 35.2 % (ref 34.0–46.6)
Hemoglobin: 11.4 g/dL (ref 11.1–15.9)
MCH: 23.4 pg — ABNORMAL LOW (ref 26.6–33.0)
MCHC: 32.4 g/dL (ref 31.5–35.7)
MCV: 72 fL — ABNORMAL LOW (ref 79–97)
Platelets: 290 10*3/uL (ref 150–450)
RBC: 4.87 x10E6/uL (ref 3.77–5.28)
RDW: 20.8 % — ABNORMAL HIGH (ref 11.7–15.4)
WBC: 7.3 10*3/uL (ref 3.4–10.8)

## 2023-02-11 LAB — LIPID PANEL
Chol/HDL Ratio: 2.9 ratio (ref 0.0–4.4)
Cholesterol, Total: 148 mg/dL (ref 100–199)
HDL: 51 mg/dL (ref >39)
LDL Chol Calc (NIH): 85 mg/dL (ref 0–99)
Triglycerides: 59 mg/dL (ref 0–149)
VLDL Cholesterol Cal: 12 mg/dL (ref 5–40)

## 2023-02-11 LAB — VITAMIN D 25 HYDROXY (VIT D DEFICIENCY, FRACTURES): Vit D, 25-Hydroxy: 30.1 ng/mL (ref 30.0–100.0)

## 2023-02-11 LAB — HEMOGLOBIN A1C
Est. average glucose Bld gHb Est-mCnc: 108 mg/dL
Hgb A1c MFr Bld: 5.4 % (ref 4.8–5.6)

## 2023-02-11 LAB — TSH: TSH: 1.55 u[IU]/mL (ref 0.450–4.500)

## 2023-02-17 ENCOUNTER — Other Ambulatory Visit: Payer: Self-pay | Admitting: Internal Medicine

## 2023-02-17 NOTE — Telephone Encounter (Signed)
Requested Prescriptions  Pending Prescriptions Disp Refills   Norethindrone Acetate-Ethinyl Estradiol (JUNEL 1.5/30) 1.5-30 MG-MCG tablet [Pharmacy Med Name: Junel 1.5/30 1.5-30 MG-MCG Oral Tablet] 63 tablet 2    Sig: TAKE 1 TABLET BY MOUTH DAILY FOR 3 WEEKS THEN OFF FOR 1 WEEK     OB/GYN:  Contraceptives Failed - 02/17/2023  4:12 AM      Failed - Last BP in normal range    BP Readings from Last 1 Encounters:  02/08/23 (!) 150/96         Passed - Valid encounter within last 12 months    Recent Outpatient Visits           1 week ago Mixed hyperlipidemia   [place] [facility] [place], Salvadore Oxford, NP   4 months ago Screening for cervical cancer   [place] [facility] [place], Salvadore Oxford, NP   5 months ago Encounter for general adult medical examination with abnormal findings   [place] [facility] [place], Salvadore Oxford, NP   6 months ago Bilateral chronic knee pain   [place] [facility] [place], Salvadore Oxford, NP   1 year ago Primary hypertension   [place] [facility] [place], Jackelyn Poling, RPH-CPP       Future Appointments             In 6 months Baity, Salvadore Oxford, NP [place] [facility], [facility]            Passed - Patient is not a smoker

## 2023-02-21 ENCOUNTER — Other Ambulatory Visit: Payer: Self-pay | Admitting: Internal Medicine

## 2023-02-22 NOTE — Telephone Encounter (Signed)
Requested medication (s) are due for refill today: No, refill available  Requested medication (s) are on the active medication list: yes    Last refill: 10/27/22  #12  1 refill  Future visit scheduled yes 08/28/33  Notes to clinic:Not delegated,please review.  Thank you  Requested Prescriptions  Pending Prescriptions Disp Refills   Vitamin D, Ergocalciferol, (DRISDOL) 1.25 MG (50000 UNIT) CAPS capsule [Pharmacy Med Name: Vitamin D (Ergocalciferol) 1.25 MG (50000 UT) Oral Capsule] 13 capsule 3    Sig: TAKE 1 CAPSULE BY MOUTH WEEKLY  FOR 12 WEEKS. THEN START OTC  VITAMIN D3: 2,000 UNITS DAILY     Endocrinology:  Vitamins - Vitamin D Supplementation 2 Failed - 02/21/2023  4:19 AM      Failed - Manual Review: Route requests for 50,000 IU strength to the provider      Failed - Ca in normal range and within 360 days    Calcium  Date Value Ref Range Status  02/10/2023 8.4 (L) 8.7 - 10.2 mg/dL Final   Calcium, Total  Date Value Ref Range Status  07/03/2013 9.0 8.5 - 10.1 mg/dL Final         Passed - Vitamin D in normal range and within 360 days    Vit D, 25-Hydroxy  Date Value Ref Range Status  02/10/2023 30.1 30.0 - 100.0 ng/mL Final    Comment:    Vitamin D deficiency has been defined by the Institute of Medicine and an Endocrine Society practice guideline as a level of serum 25-OH vitamin D less than 20 ng/mL (1,2). The Endocrine Society went on to further define vitamin D insufficiency as a level between 21 and 29 ng/mL (2). 1. IOM (Institute of Medicine). 2010. Dietary reference    intakes for calcium and D. [place]: The    [employer]. 2. Holick MF, Binkley [place], Bischoff-Ferrari HA, et al.    Evaluation, treatment, and prevention of vitamin D    deficiency: an Endocrine Society clinical practice    guideline. JCEM. 2011 Jul; 96(7):1911-30.          Passed - Valid encounter within last 12 months    Recent Outpatient Visits           2 weeks ago Mixed  hyperlipidemia   [place] [facility] [place], Salvadore Oxford, NP   4 months ago Screening for cervical cancer   [place] [facility] [place], Salvadore Oxford, NP   6 months ago Encounter for general adult medical examination with abnormal findings   [place] [facility] [place], Salvadore Oxford, NP   6 months ago Bilateral chronic knee pain   [place] [facility] [place], Salvadore Oxford, NP   1 year ago Primary hypertension   [place] [facility] [place], Jackelyn Poling, RPH-CPP       Future Appointments             In 6 months Baity, Salvadore Oxford, NP [facility], [facility]

## 2023-03-17 ENCOUNTER — Other Ambulatory Visit: Payer: Self-pay | Admitting: Internal Medicine

## 2023-03-17 NOTE — Telephone Encounter (Signed)
Requested Prescriptions  Pending Prescriptions Disp Refills   losartan (COZAAR) 100 MG tablet [Pharmacy Med Name: LOSARTAN 100MG  TABLETS] 90 tablet 1    Sig: TAKE 1 TABLET(100 MG) BY MOUTH DAILY     Cardiovascular:  Angiotensin Receptor Blockers Failed - 03/17/2023 10:11 AM      Failed - Cr in normal range and within 180 days    Creatinine  Date Value Ref Range Status  07/03/2013 0.85 0.60 - 1.30 mg/dL Final   Creatinine, Ser  Date Value Ref Range Status  02/10/2023 1.07 (H) 0.57 - 1.00 mg/dL Final         Failed - Last BP in normal range    BP Readings from Last 1 Encounters:  02/08/23 (!) 150/96         Passed - K in normal range and within 180 days    Potassium  Date Value Ref Range Status  02/10/2023 4.0 3.5 - 5.2 mmol/L Final  07/03/2013 3.6 3.5 - 5.1 mmol/L Final         Passed - Patient is not pregnant      Passed - Valid encounter within last 6 months    Recent Outpatient Visits           1 month ago Mixed hyperlipidemia   [place] [facility] [place], Salvadore Oxford, NP   5 months ago Screening for cervical cancer   [place] [facility] [place], Salvadore Oxford, NP   6 months ago Encounter for general adult medical examination with abnormal findings   [place] [facility] [place], Salvadore Oxford, NP   7 months ago Bilateral chronic knee pain   [place] [facility] [place], Salvadore Oxford, NP   1 year ago Primary hypertension   [facility], Jackelyn Poling, RPH-CPP       Future Appointments             In 5 months Baity, Salvadore Oxford, NP [place] [facility], [facility]

## 2023-03-28 ENCOUNTER — Other Ambulatory Visit: Payer: Self-pay | Admitting: Sports Medicine

## 2023-04-14 ENCOUNTER — Other Ambulatory Visit: Payer: Self-pay

## 2023-04-14 MED ORDER — LOSARTAN POTASSIUM 100 MG PO TABS: 100.0000 mg | ORAL_TABLET | Freq: Every day | ORAL | 1 refills | Status: DC

## 2023-05-01 ENCOUNTER — Encounter: Payer: Self-pay | Admitting: Internal Medicine

## 2023-05-01 DIAGNOSIS — Z01419 Encounter for gynecological examination (general) (routine) without abnormal findings: Secondary | ICD-10-CM

## 2023-05-02 MED ORDER — ACYCLOVIR 400 MG PO TABS: 400.0000 mg | ORAL_TABLET | Freq: Three times a day (TID) | ORAL | 0 refills | Status: AC | PRN

## 2023-05-02 MED ORDER — MELOXICAM 15 MG PO TABS: 15.0000 mg | ORAL_TABLET | Freq: Every day | ORAL | 0 refills | Status: DC

## 2023-05-16 IMAGING — MG MM DIGITAL SCREENING BILAT W/ TOMO AND CAD
8 series · 8 of 24 positions shown · non-contrast
Comparison: Previous exam(s).

CLINICAL DATA: Screening.

EXAM:
DIGITAL SCREENING BILATERAL MAMMOGRAM WITH TOMOSYNTHESIS AND CAD
TECHNIQUE: Bilateral screening digital craniocaudal and mediolateral oblique
mammograms were obtained. Bilateral screening digital breast
tomosynthesis was performed. The images were evaluated with
computer-aided detection.

[L MLO synth-2D]
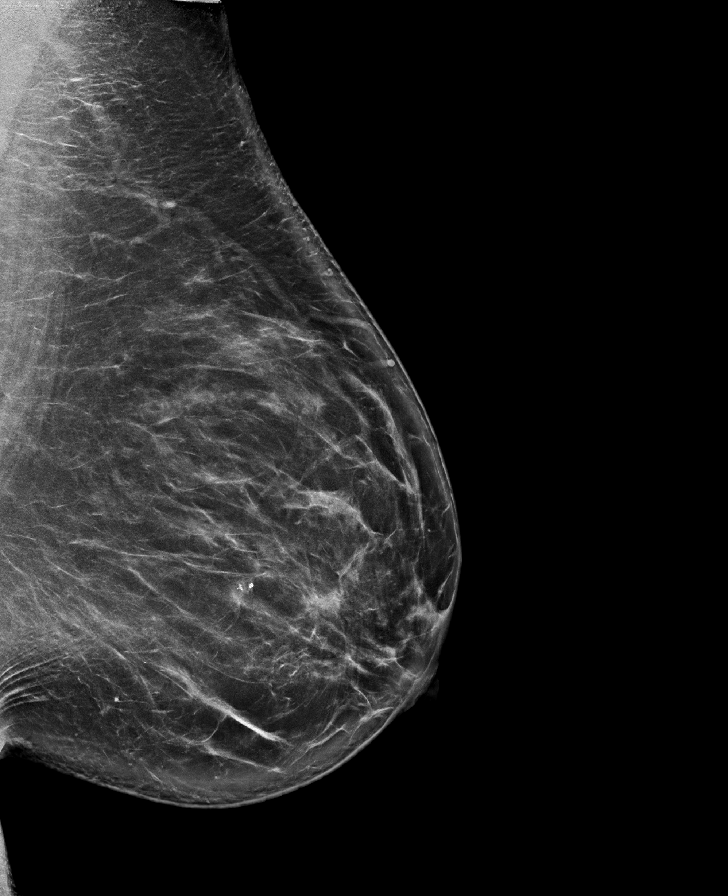

[R MLO synth-2D]
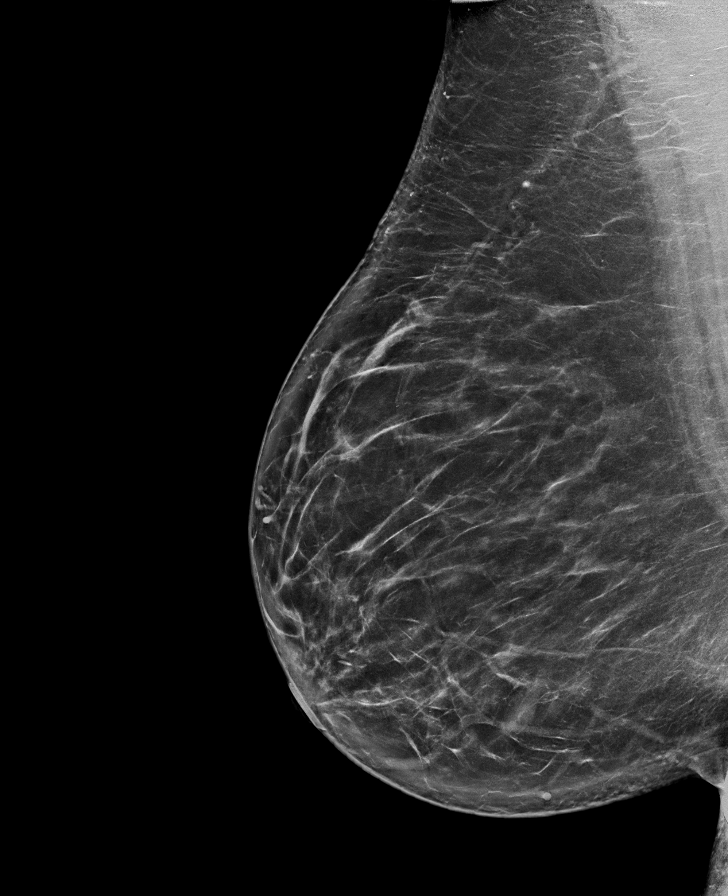

[R CC synth-2D]
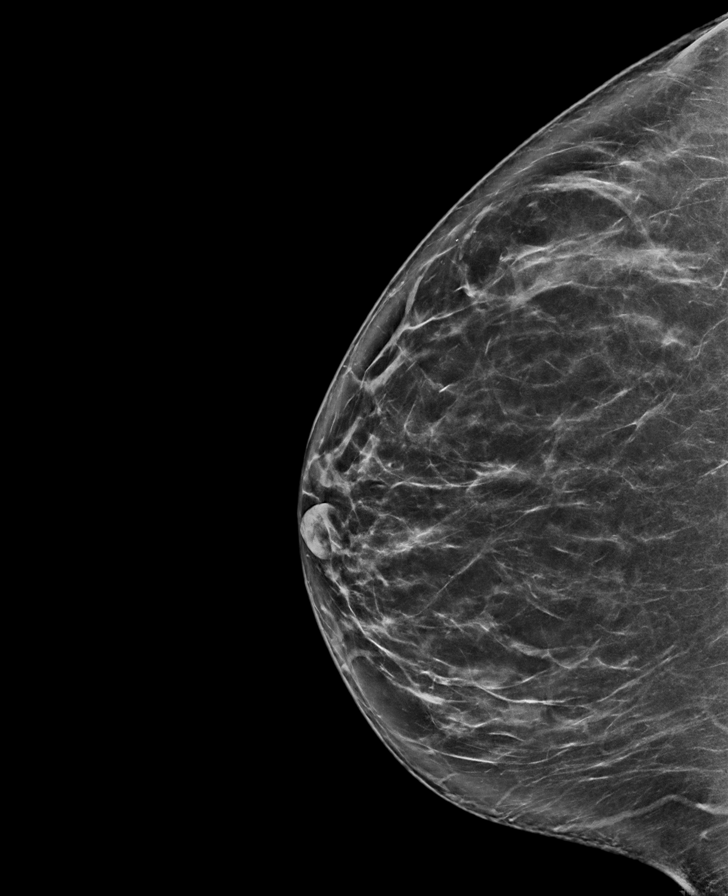

[L CC synth-2D]
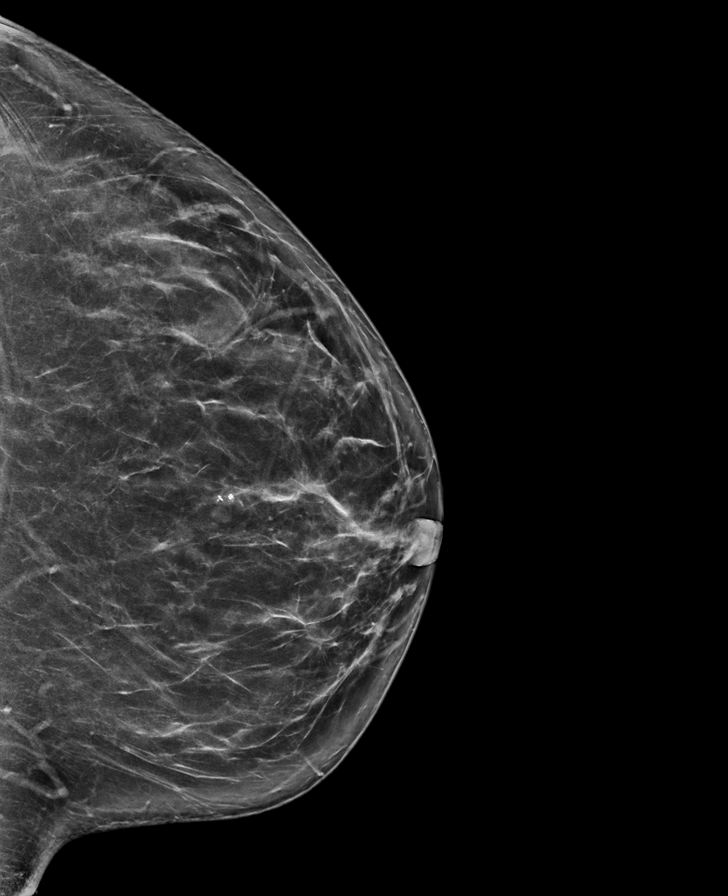

[L CC tomo · tomo slice 46/91.0]
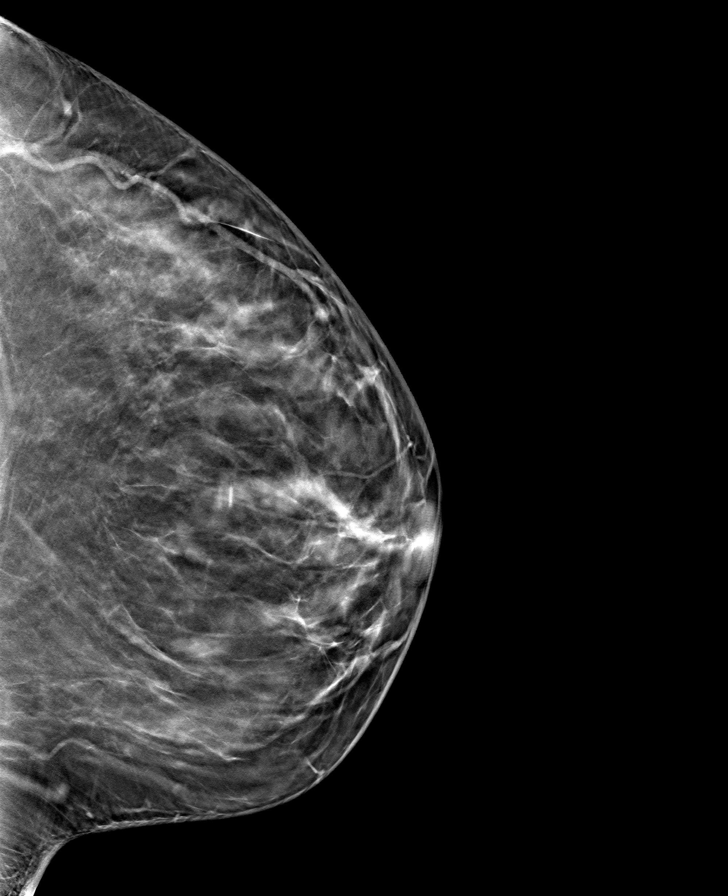

[R CC tomo · tomo slice 47/93.0]
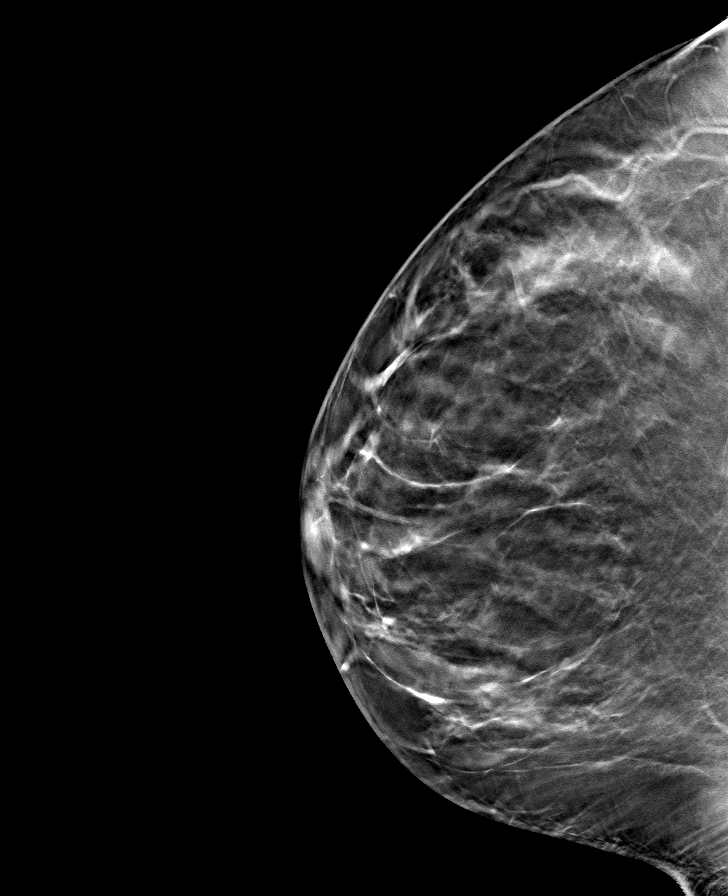

[R MLO tomo · tomo slice 52/103.0]
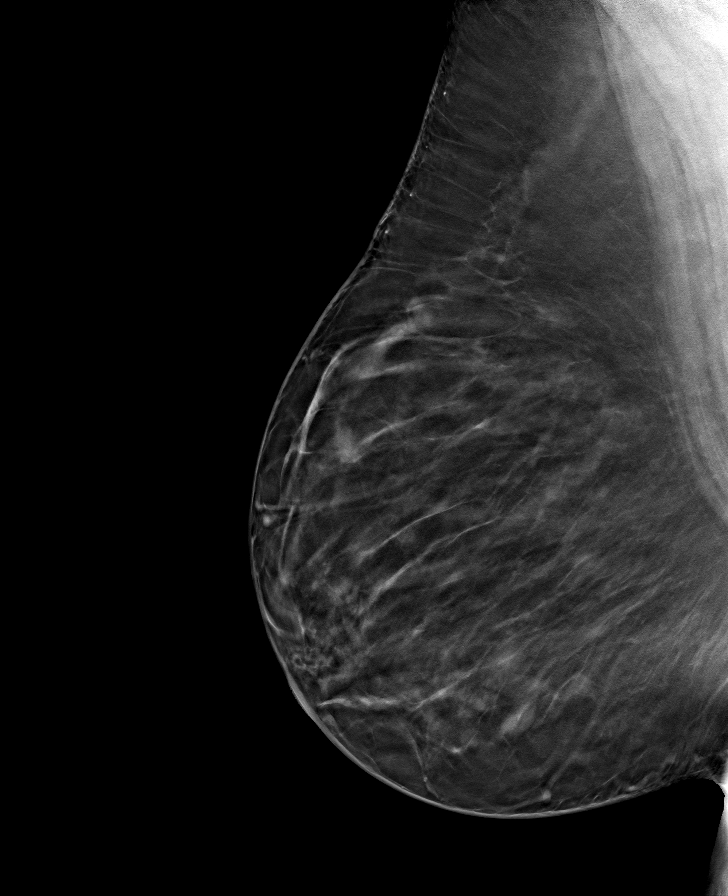

[L MLO tomo · tomo slice 52/103.0]
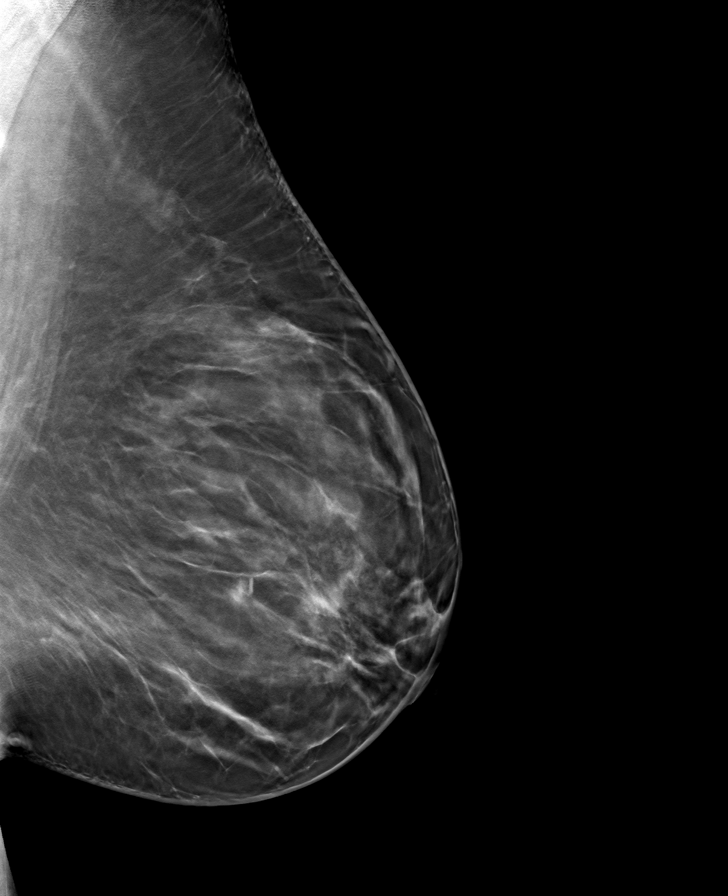

[8 of 24 positions shown; findings below may reference images not displayed]

ACR Breast Density Category b: There are scattered areas of
fibroglandular density.
FINDINGS: There are no findings suspicious for malignancy.
IMPRESSION: No mammographic evidence of malignancy. A result letter of this
screening mammogram will be mailed directly to the patient.

RECOMMENDATION:
Screening mammogram in one year. (Code:51-O-LD2)

BI-RADS CATEGORY  1: Negative.

## 2023-05-23 MED ORDER — FLUOXETINE HCL 10 MG PO TABS: 10.0000 mg | ORAL_TABLET | Freq: Every day | ORAL | 1 refills | Status: DC

## 2023-05-23 MED ORDER — AMLODIPINE BESYLATE 10 MG PO TABS
10.0000 mg | ORAL_TABLET | Freq: Every day | ORAL | 1 refills | Status: DC
Start: 2023-05-23 — End: 2023-09-13

## 2023-05-23 NOTE — Addendum Note (Signed)
Addended by: Kavin Leech E on: 05/23/2023 08:16 AM   Modules accepted: Orders

## 2023-06-13 ENCOUNTER — Other Ambulatory Visit: Payer: Self-pay | Admitting: Internal Medicine

## 2023-06-13 DIAGNOSIS — Z1231 Encounter for screening mammogram for malignant neoplasm of breast: Secondary | ICD-10-CM

## 2023-07-03 ENCOUNTER — Other Ambulatory Visit: Payer: Self-pay | Admitting: Internal Medicine

## 2023-07-04 NOTE — Telephone Encounter (Signed)
Requested Prescriptions  Pending Prescriptions Disp Refills   meloxicam (MOBIC) 15 MG tablet [Pharmacy Med Name: Meloxicam 15 MG Oral Tablet] 90 tablet 3    Sig: TAKE 1 TABLET BY MOUTH DAILY     Analgesics:  COX2 Inhibitors Failed - 07/03/2023  4:21 AM      Failed - Manual Review: Labs are only required if the patient has taken medication for more than 8 weeks.      Failed - Cr in normal range and within 360 days    Creatinine  Date Value Ref Range Status  07/03/2013 0.85 0.60 - 1.30 mg/dL Final   Creatinine, Ser  Date Value Ref Range Status  02/10/2023 1.07 (H) 0.57 - 1.00 mg/dL Final         Passed - HGB in normal range and within 360 days    Hemoglobin  Date Value Ref Range Status  02/10/2023 11.4 11.1 - 15.9 g/dL Final         Passed - HCT in normal range and within 360 days    Hematocrit  Date Value Ref Range Status  02/10/2023 35.2 34.0 - 46.6 % Final         Passed - AST in normal range and within 360 days    AST  Date Value Ref Range Status  02/10/2023 11 0 - 40 IU/L Final   SGOT(AST)  Date Value Ref Range Status  07/03/2013 15 15 - 37 Unit/L Final         Passed - ALT in normal range and within 360 days    ALT  Date Value Ref Range Status  02/10/2023 6 0 - 32 IU/L Final   SGPT (ALT)  Date Value Ref Range Status  07/03/2013 16 12 - 78 U/L Final         Passed - eGFR is 30 or above and within 360 days    EGFR (African American)  Date Value Ref Range Status  07/03/2013 >60  Final   GFR calc Af Amer  Date Value Ref Range Status  08/24/2020 82 >59 mL/min/1.73 Final    Comment:    **In accordance with recommendations from the NKF-ASN Task force,**   [facility] is in the process of updating its eGFR calculation to the   2021 CKD-EPI creatinine equation that estimates kidney function   without a race variable.    EGFR (Non-African Amer.)  Date Value Ref Range Status  07/03/2013 >60  Final    Comment:    eGFR values <26mL/min/1.73 m2 may be an  indication of chronic kidney disease (CKD). Calculated eGFR is useful in patients with stable renal function. The eGFR calculation will not be reliable in acutely ill patients when serum creatinine is changing rapidly. It is not useful in  patients on dialysis. The eGFR calculation may not be applicable to patients at the low and high extremes of body sizes, pregnant women, and vegetarians.    GFR calc non Af Amer  Date Value Ref Range Status  08/24/2020 71 >59 mL/min/1.73 Final   eGFR  Date Value Ref Range Status  02/10/2023 64 >59 mL/min/1.73 Final         Passed - Patient is not pregnant      Passed - Valid encounter within last 12 months    Recent Outpatient Visits           4 months ago Mixed hyperlipidemia   [place] [facility] [place], Salvadore Oxford, NP   9 months ago  Screening for cervical cancer   [place] [facility] [place], Salvadore Oxford, NP   10 months ago Encounter for general adult medical examination with abnormal findings   [place] [facility] [place], Salvadore Oxford, NP   11 months ago Bilateral chronic knee pain   [place] [facility] [place], Salvadore Oxford, NP   1 year ago Primary hypertension   [place] [facility] [place], Jackelyn Poling, RPH-CPP       Future Appointments             In 1 month Baity, Salvadore Oxford, NP [facility], [place]

## 2023-07-20 ENCOUNTER — Ambulatory Visit
Admission: RE | Admit: 2023-07-20 | Discharge: 2023-07-20 | Disposition: A | Discharge status: Home / Self Care | Payer: Managed Care, Other (non HMO) | Source: Ambulatory Visit | Attending: Internal Medicine | Admitting: Internal Medicine

## 2023-07-20 DIAGNOSIS — Z1231 Encounter for screening mammogram for malignant neoplasm of breast: Secondary | ICD-10-CM | POA: Insufficient documentation

## 2023-08-28 ENCOUNTER — Ambulatory Visit: Payer: Managed Care, Other (non HMO) | Admitting: Internal Medicine

## 2023-08-29 ENCOUNTER — Encounter: Payer: Self-pay | Admitting: Internal Medicine

## 2023-08-29 ENCOUNTER — Ambulatory Visit (INDEPENDENT_AMBULATORY_CARE_PROVIDER_SITE_OTHER): Payer: Managed Care, Other (non HMO) | Admitting: Internal Medicine

## 2023-08-29 VITALS — BP 138/74 | Ht 65.0 in | Wt 351.0 lb

## 2023-08-29 DIAGNOSIS — Z124 Encounter for screening for malignant neoplasm of cervix: Secondary | ICD-10-CM

## 2023-08-29 DIAGNOSIS — Z113 Encounter for screening for infections with a predominantly sexual mode of transmission: Secondary | ICD-10-CM

## 2023-08-29 DIAGNOSIS — Z0001 Encounter for general adult medical examination with abnormal findings: Secondary | ICD-10-CM

## 2023-08-29 NOTE — Progress Notes (Signed)
 Subjective:    Patient ID: Margaret Carlson, female    DOB: Nov 30, 1975, 47 y.o.   MRN: [id number]  HPI  Patient presents to clinic today for her annual exam.   Flu: 05/2023 Tetanus: 02/2015 COVID: Moderna x 3 Pap smear: 09/2022 Mammogram: 06/2023 Colon screening: 11/2020 Vision screening: annually Dentist: biannually  Diet: She does eat meat.  She consumes fruits and vegetables.  She tries avoid fried foods. Exercise: Walking  Review of Systems     Past Medical History:  Diagnosis Date   Allergy    Anxiety    Hyperlipidemia    Hypertension     Current Outpatient Medications  Medication Sig Dispense Refill   acyclovir  (ZOVIRAX ) 400 MG tablet Take 1 tablet (400 mg total) by mouth 3 (three) times daily as needed (Take for 5 days for an outbreak). 90 tablet 0   amLODipine  (NORVASC ) 10 MG tablet Take 1 tablet (10 mg total) by mouth daily. 90 tablet 1   Calcium -Vitamin D -Vitamin K (CALCIUM  + D + K PO) Take 1 capsule by mouth daily.     cyanocobalamin 2000 MCG tablet Take 2,000 mcg by mouth daily.     FLUoxetine  (PROZAC ) 10 MG tablet Take 1 tablet (10 mg total) by mouth daily. 90 tablet 1   losartan  (COZAAR ) 100 MG tablet Take 1 tablet (100 mg total) by mouth daily. 90 tablet 1   meloxicam  (MOBIC ) 15 MG tablet Take 1 tablet (15 mg total) by mouth daily. 90 tablet 0   Multiple Vitamin (MULTIVITAMIN) tablet Take 1 tablet by mouth daily.     Norethindrone  Acetate-Ethinyl Estradiol (JUNEL 1.5/30) 1.5-30 MG-MCG tablet TAKE 1 TABLET BY MOUTH DAILY FOR 3 WEEKS THEN OFF FOR 1 WEEK 63 tablet 2   omeprazole  (PRILOSEC) 20 MG capsule TAKE 1 CAPSULE BY MOUTH DAILY 90 capsule 3   rosuvastatin  (CRESTOR ) 10 MG tablet TAKE 1 TABLET BY MOUTH DAILY 90 tablet 1   Vitamin D , Ergocalciferol , (DRISDOL ) 1.25 MG (50000 UNIT) CAPS capsule TAKE 1 CAPSULE BY MOUTH WEEKLY  FOR 12 WEEKS. THEN START OTC  VITAMIN D3: 2,000 UNITS DAILY 12 capsule 1   No current facility-administered medications for this visit.     Allergies  Allergen Reactions   Ranitidine Swelling    Face and lip swelling    Family History  Problem Relation Age of Onset   Arthritis Maternal Grandmother    Hypertension Maternal Grandmother    Breast cancer Maternal Grandmother 66   Colon cancer Neg Hx    Colon polyps Neg Hx    Rectal cancer Neg Hx    Stomach cancer Neg Hx    Esophageal cancer Neg Hx     Social History   Socioeconomic History   Marital status: Single    Spouse name: Not on file   Number of children: Not on file   Years of education: Not on file   Highest education level: Bachelor's degree (e.g., BA, AB, BS)  Occupational History   Not on file  Tobacco Use   Smoking status: Never   Smokeless tobacco: Never  Vaping Use   Vaping status: Never Used  Substance and Sexual Activity   Alcohol use: Yes    Alcohol/week: 0.0 standard drinks of alcohol    Comment: occasional   Drug use: No   Sexual activity: Yes    Partners: Male    Birth control/protection: Pill  Other Topics Concern   Not on file  Social History Narrative   Not on file  Social Drivers of Corporate Investment Banker Strain: Low Risk  (02/07/2023)   Overall Financial Resource Strain (CARDIA)    Difficulty of Paying Living Expenses: Not hard at all  Food Insecurity: No Food Insecurity (02/07/2023)   Hunger Vital Sign    Worried About Running Out of Food in the Last Year: Never true    Ran Out of Food in the Last Year: Never true  Transportation Needs: No Transportation Needs (02/07/2023)   PRAPARE - Administrator, Civil Service (Medical): No    Lack of Transportation (Non-Medical): No  Physical Activity: Insufficiently Active (02/07/2023)   Exercise Vital Sign    Days of Exercise per Week: 1 day    Minutes of Exercise per Session: 10 min  Stress: No Stress Concern Present (02/07/2023)   [employer] of Occupational Health - Occupational Stress Questionnaire    Feeling of Stress : Not at all  Social  Connections: Moderately Integrated (02/07/2023)   Social Connection and Isolation Panel [NHANES]    Frequency of Communication with Friends and Family: More than three times a week    Frequency of Social Gatherings with Friends and Family: Once a week    Attends Religious Services: More than 4 times per year    Active Member of [employer] or Organizations: Yes    Attends Banker Meetings: 1 to 4 times per year    Marital Status: Never married  Intimate Partner Violence: Not on file     Constitutional: Denies fever, malaise, fatigue, headache or abrupt weight changes.  HEENT: Denies eye pain, eye redness, ear pain, ringing in the ears, wax buildup, runny nose, nasal congestion, bloody nose, or sore throat. Respiratory: Denies difficulty breathing, shortness of breath, cough or sputum production.   Cardiovascular: Pt reports swelling in legs. Denies chest pain, chest tightness, palpitations or swelling in the hands.  Gastrointestinal: Denies abdominal pain, bloating, constipation, diarrhea or blood in the stool.  GU: Denies urgency, frequency, pain with urination, burning sensation, blood in urine, odor or discharge. Musculoskeletal: Patient reports knee pain.  Denies decrease in range of motion, difficulty with gait, muscle pain or joint swelling.  Skin: Denies redness, rashes, lesions or ulcercations.  Neurological: Patient reports insomnia.  Denies dizziness, difficulty with memory, difficulty with speech or problems with balance and coordination.  Psych: Patient has a history of anxiety and depression.  Denies SI/HI.  No other specific complaints in a complete review of systems (except as listed in HPI above).  Objective:   Physical Exam  BP (!) 140/82 (BP Location: Left Arm, Patient Position: Sitting, Cuff Size: Large)   Ht 5' 5 (1.651 m)   Wt (!) 351 lb (159.2 kg)   BMI 58.41 kg/m    Wt Readings from Last 3 Encounters:  02/08/23 (!) 345 lb (156.5 kg)  09/29/22 (!) 333  lb (151 kg)  08/25/22 (!) 337 lb (152.9 kg)    General: Appears her stated age, obese in NAD. Skin: Warm, dry and intact.  HEENT: Head: normal shape and size; Eyes: sclera white, no icterus, conjunctiva pink, PERRLA and EOMs intact;  Neck:  Neck supple, trachea midline. No masses, lumps or thyromegaly present.  Cardiovascular: Normal rate and rhythm. S1,S2 noted.  No murmur, rubs or gallops noted. No JVD.  Trace pitting BLE edema.  Pulmonary/Chest: Normal effort and positive vesicular breath sounds. No respiratory distress. No wheezes, rales or ronchi noted.  Abdomen:  Normal bowel sounds. Musculoskeletal: Strength 5/5 BUE/BLE.  No difficulty  with gait.  Neurological: Alert and oriented. Cranial nerves II-XII grossly intact. Coordination normal.  Psychiatric: Mood and affect normal. Behavior is normal. Judgment and thought content normal.     BMET    Component Value Date/Time   NA 138 02/10/2023 1315   NA 137 07/03/2013 2132   K 4.0 02/10/2023 1315   K 3.6 07/03/2013 2132   CL 104 02/10/2023 1315   CL 103 07/03/2013 2132   CO2 22 02/10/2023 1315   CO2 29 07/03/2013 2132   GLUCOSE 72 02/10/2023 1315   GLUCOSE 118 (H) 06/18/2018 1756   GLUCOSE 90 07/03/2013 2132   BUN 13 02/10/2023 1315   BUN 9 07/03/2013 2132   CREATININE 1.07 (H) 02/10/2023 1315   CREATININE 0.85 07/03/2013 2132   CALCIUM  8.4 (L) 02/10/2023 1315   CALCIUM  9.0 07/03/2013 2132   GFRNONAA 71 08/24/2020 1626   GFRNONAA >60 07/03/2013 2132   GFRAA 82 08/24/2020 1626   GFRAA >60 07/03/2013 2132    Lipid Panel     Component Value Date/Time   CHOL 148 02/10/2023 1315   TRIG 59 02/10/2023 1315   HDL 51 02/10/2023 1315   CHOLHDL 2.9 02/10/2023 1315   LDLCALC 85 02/10/2023 1315    CBC    Component Value Date/Time   WBC 7.3 02/10/2023 1315   WBC 7.7 06/18/2018 1756   RBC 4.87 02/10/2023 1315   RBC 6.05 (H) 06/18/2018 1756   HGB 11.4 02/10/2023 1315   HCT 35.2 02/10/2023 1315   PLT 290 02/10/2023 1315    MCV 72 (L) 02/10/2023 1315   MCV 73 (L) 07/03/2013 2132   MCH 23.4 (L) 02/10/2023 1315   MCH 23.0 (L) 06/18/2018 1756   MCHC 32.4 02/10/2023 1315   MCHC 33.1 06/18/2018 1756   RDW 20.8 (H) 02/10/2023 1315   RDW 19.1 (H) 07/03/2013 2132   LYMPHSABS 2.5 03/17/2015 1618   EOSABS 0.1 03/17/2015 1618   BASOSABS 0.0 03/17/2015 1618    Hgb A1C Lab Results  Component Value Date   HGBA1C 5.4 02/10/2023           Assessment & Plan:   Preventative Health Maintenance:  Flu shot  UTD Tetanus UTD Encouraged her to get her COVID booster Pap smear UTD Mammogram UTD Colon screening UTD Encouraged her to consume a balanced diet and exercise regimen Advised her to see an eye doctor and dentist annually We will check CBC, c-Met, lipid, A1c, vitamin D  and TSH today  RTC in 6 months, follow-up chronic conditions Angeline Laura, NP

## 2023-08-29 NOTE — Patient Instructions (Signed)

## 2023-08-29 NOTE — Assessment & Plan Note (Signed)
 Encourage diet and exercise for weight loss

## 2023-09-01 ENCOUNTER — Encounter: Payer: Self-pay | Admitting: Internal Medicine

## 2023-09-01 DIAGNOSIS — R7989 Other specified abnormal findings of blood chemistry: Secondary | ICD-10-CM

## 2023-09-01 LAB — COMPREHENSIVE METABOLIC PANEL
ALT: 6 [IU]/L (ref 0–32)
AST: 13 [IU]/L (ref 0–40)
Albumin: 3.5 g/dL — ABNORMAL LOW (ref 3.9–4.9)
Alkaline Phosphatase: 75 [IU]/L (ref 44–121)
BUN/Creatinine Ratio: 10 (ref 9–23)
BUN: 14 mg/dL (ref 6–24)
Bilirubin Total: 0.2 mg/dL (ref 0.0–1.2)
CO2: 21 mmol/L (ref 20–29)
Calcium: 8.6 mg/dL — ABNORMAL LOW (ref 8.7–10.2)
Chloride: 105 mmol/L (ref 96–106)
Creatinine, Ser: 1.38 mg/dL — ABNORMAL HIGH (ref 0.57–1.00)
Globulin, Total: 3.4 g/dL (ref 1.5–4.5)
Glucose: 66 mg/dL — ABNORMAL LOW (ref 70–99)
Potassium: 4 mmol/L (ref 3.5–5.2)
Sodium: 140 mmol/L (ref 134–144)
Total Protein: 6.9 g/dL (ref 6.0–8.5)
eGFR: 48 mL/min/{1.73_m2} — ABNORMAL LOW (ref >59)

## 2023-09-01 LAB — CBC
Hematocrit: 37 % (ref 34.0–46.6)
Hemoglobin: 11.5 g/dL (ref 11.1–15.9)
MCH: 23.1 pg — ABNORMAL LOW (ref 26.6–33.0)
MCHC: 31.1 g/dL — ABNORMAL LOW (ref 31.5–35.7)
MCV: 74 fL — ABNORMAL LOW (ref 79–97)
Platelets: 327 10*3/uL (ref 150–450)
RBC: 4.97 x10E6/uL (ref 3.77–5.28)
RDW: 21.1 % — ABNORMAL HIGH (ref 11.7–15.4)
WBC: 6.2 10*3/uL (ref 3.4–10.8)

## 2023-09-01 LAB — HEPATITIS C ANTIBODY: Hep C Virus Ab: NONREACTIVE

## 2023-09-01 LAB — LIPID PANEL
Chol/HDL Ratio: 3.4 {ratio} (ref 0.0–4.4)
Cholesterol, Total: 159 mg/dL (ref 100–199)
HDL: 47 mg/dL (ref >39)
LDL Chol Calc (NIH): 100 mg/dL — ABNORMAL HIGH (ref 0–99)
Triglycerides: 59 mg/dL (ref 0–149)
VLDL Cholesterol Cal: 12 mg/dL (ref 5–40)

## 2023-09-01 LAB — RPR: RPR Ser Ql: NONREACTIVE

## 2023-09-01 LAB — HIV ANTIBODY (ROUTINE TESTING W REFLEX): HIV Screen 4th Generation wRfx: NONREACTIVE

## 2023-09-01 LAB — VITAMIN D 25 HYDROXY (VIT D DEFICIENCY, FRACTURES): Vit D, 25-Hydroxy: 25.7 ng/mL — ABNORMAL LOW (ref 30.0–100.0)

## 2023-09-01 LAB — HEMOGLOBIN A1C
Est. average glucose Bld gHb Est-mCnc: 114 mg/dL
Hgb A1c MFr Bld: 5.6 % (ref 4.8–5.6)

## 2023-09-01 LAB — TSH: TSH: 1.12 u[IU]/mL (ref 0.450–4.500)

## 2023-09-11 ENCOUNTER — Other Ambulatory Visit: Payer: Self-pay | Admitting: Internal Medicine

## 2023-09-11 DIAGNOSIS — Z01419 Encounter for gynecological examination (general) (routine) without abnormal findings: Secondary | ICD-10-CM

## 2023-09-12 NOTE — Telephone Encounter (Signed)
 Request too soon for refill, last refill 05/23/23 for 90 and 1 refill. Requested Prescriptions  Pending Prescriptions Disp Refills   FLUoxetine  (PROZAC ) 10 MG tablet [Pharmacy Med Name: FLUOXETINE   10MG   TAB] 90 tablet 3    Sig: TAKE 1 TABLET BY MOUTH DAILY     Psychiatry:  Antidepressants - SSRI Passed - 09/12/2023  3:45 PM      Passed - Completed PHQ-2 or PHQ-9 in the last 360 days      Passed - Valid encounter within last 6 months    Recent Outpatient Visits           2 weeks ago Encounter for general adult medical examination with abnormal findings   [place] [facility] [place], Angeline ORN, NP   7 months ago Mixed hyperlipidemia   [place] [facility], Angeline ORN, NP   11 months ago Screening for cervical cancer   [place] [facility] [place], Angeline ORN, NP   1 year ago Encounter for general adult medical examination with abnormal findings   [place] [facility] [place], Angeline ORN, NP   1 year ago Bilateral chronic knee pain   [place] [facility], Angeline ORN, NP       Future Appointments             In 5 months Baity, Angeline ORN, NP [facility], [facility]             amLODipine  (NORVASC ) 10 MG tablet [Pharmacy Med Name: amLODIPine  Besylate 10 MG Oral Tablet] 90 tablet 3    Sig: TAKE 1 TABLET BY MOUTH DAILY     Cardiovascular: Calcium  Channel Blockers 2 Passed - 09/12/2023  3:45 PM      Passed - Last BP in normal range    BP Readings from Last 1 Encounters:  08/29/23 138/74         Passed - Last Heart Rate in normal range    Pulse Readings from Last 1 Encounters:  02/08/23 79         Passed - Valid encounter within last 6 months    Recent Outpatient Visits           2 weeks ago Encounter for general adult medical examination with abnormal findings   [place] [facility] [place], Angeline ORN, NP   7 months ago Mixed hyperlipidemia    [place] [facility] [place], Angeline ORN, NP   11 months ago Screening for cervical cancer   [place] [facility] [place], Angeline ORN, NP   1 year ago Encounter for general adult medical examination with abnormal findings   [place] [facility] [place], Angeline ORN, NP   1 year ago Bilateral chronic knee pain   [place] [facility] [place], Angeline ORN, NP       Future Appointments             In 5 months Baity, Angeline ORN, NP [place] [facility], [facility]

## 2023-09-13 ENCOUNTER — Other Ambulatory Visit: Payer: Self-pay

## 2023-09-13 DIAGNOSIS — Z01419 Encounter for gynecological examination (general) (routine) without abnormal findings: Secondary | ICD-10-CM

## 2023-09-13 MED ORDER — AMLODIPINE BESYLATE 10 MG PO TABS
10.0000 mg | ORAL_TABLET | Freq: Every day | ORAL | 1 refills | Status: DC
Start: 2023-09-13 — End: 2024-05-06

## 2023-09-13 MED ORDER — FLUOXETINE HCL 10 MG PO TABS: 10.0000 mg | ORAL_TABLET | Freq: Every day | ORAL | 1 refills | Status: DC

## 2023-11-01 ENCOUNTER — Encounter: Payer: Self-pay | Admitting: Internal Medicine

## 2023-11-08 ENCOUNTER — Other Ambulatory Visit: Payer: Self-pay | Admitting: Internal Medicine

## 2023-11-08 NOTE — Telephone Encounter (Signed)
 Requested Prescriptions  Pending Prescriptions Disp Refills   Norethindrone Acetate-Ethinyl Estradiol (LARIN 1.5/30) 1.5-30 MG-MCG tablet [Pharmacy Med Name: LARIN TAB 1.5/30-21 #] 90 tablet 0    Sig: TAKE 1 TABLET BY MOUTH DAILY FOR 3 WEEKS THEN OFF FOR 1 WEEK     OB/GYN:  Contraceptives Passed - 11/08/2023  3:30 PM      Passed - Last BP in normal range    BP Readings from Last 1 Encounters:  08/29/23 138/74         Passed - Valid encounter within last 12 months    Recent Outpatient Visits           2 months ago Encounter for general adult medical examination with abnormal findings   [place] [facility] [place], Salvadore Oxford, NP   9 months ago Mixed hyperlipidemia   [place] [facility] [place], Salvadore Oxford, NP   1 year ago Screening for cervical cancer   [place] [facility] [place], Salvadore Oxford, NP   1 year ago Encounter for general adult medical examination with abnormal findings   [place] [facility] [place], Salvadore Oxford, NP   1 year ago Bilateral chronic knee pain   [place] [facility] [place], Salvadore Oxford, NP       Future Appointments             In 3 months Baity, Salvadore Oxford, NP [place] [facility], [facility]            Passed - Patient is not a smoker

## 2023-11-12 ENCOUNTER — Other Ambulatory Visit: Payer: Self-pay | Admitting: Internal Medicine

## 2023-11-14 NOTE — Telephone Encounter (Signed)
 Requested Prescriptions  Pending Prescriptions Disp Refills   losartan (COZAAR) 100 MG tablet [Pharmacy Med Name: Losartan Potassium 100 MG Oral Tablet] 90 tablet 1    Sig: TAKE 1 TABLET BY MOUTH DAILY     Cardiovascular:  Angiotensin Receptor Blockers Failed - 11/14/2023  8:39 AM      Failed - Cr in normal range and within 180 days    Creatinine  Date Value Ref Range Status  07/03/2013 0.85 0.60 - 1.30 mg/dL Final   Creatinine, Ser  Date Value Ref Range Status  08/31/2023 1.38 (H) 0.57 - 1.00 mg/dL Final         Passed - K in normal range and within 180 days    Potassium  Date Value Ref Range Status  08/31/2023 4.0 3.5 - 5.2 mmol/L Final  07/03/2013 3.6 3.5 - 5.1 mmol/L Final         Passed - Patient is not pregnant      Passed - Last BP in normal range    BP Readings from Last 1 Encounters:  08/29/23 138/74         Passed - Valid encounter within last 6 months    Recent Outpatient Visits           2 months ago Encounter for general adult medical examination with abnormal findings   [place] [facility] [place], Salvadore Oxford, NP   9 months ago Mixed hyperlipidemia   [place] [facility] [place], Salvadore Oxford, NP   1 year ago Screening for cervical cancer   [place] [facility] [place], Salvadore Oxford, NP   1 year ago Encounter for general adult medical examination with abnormal findings   [place] [facility] [place], Salvadore Oxford, NP   1 year ago Bilateral chronic knee pain   [place] [facility] [place], Salvadore Oxford, NP       Future Appointments             In 3 months Baity, Salvadore Oxford, NP [place] [facility], [facility]             Vitamin D, Ergocalciferol, (DRISDOL) 1.25 MG (50000 UNIT) CAPS capsule [Pharmacy Med Name: Vitamin D (Ergocalciferol) 1.25 MG (50000 UT) Oral Capsule] 13 capsule 3    Sig: TAKE 1 CAPSULE BY MOUTH WEEKLY  FOR 12 WEEKS. THEN START OTC  VITAMIN D3: 2,000  UNITS DAILY     Endocrinology:  Vitamins - Vitamin D Supplementation 2 Failed - 11/14/2023  8:39 AM      Failed - Manual Review: Route requests for 50,000 IU strength to the provider      Failed - Ca in normal range and within 360 days    Calcium  Date Value Ref Range Status  08/31/2023 8.6 (L) 8.7 - 10.2 mg/dL Final   Calcium, Total  Date Value Ref Range Status  07/03/2013 9.0 8.5 - 10.1 mg/dL Final         Failed - Vitamin D in normal range and within 360 days    Vit D, 25-Hydroxy  Date Value Ref Range Status  08/31/2023 25.7 (L) 30.0 - 100.0 ng/mL Final    Comment:    Vitamin D deficiency has been defined by the Institute of Medicine and an Endocrine Society practice guideline as a level of serum 25-OH vitamin D less than 20 ng/mL (1,2). The Endocrine Society went on to further define vitamin D insufficiency as  a level between 21 and 29 ng/mL (2). 1. IOM (Institute of Medicine). 2010. Dietary reference    intakes for calcium and D. [place]: The    [employer]. 2. Holick MF, Binkley [place], Bischoff-Ferrari HA, et al.    Evaluation, treatment, and prevention of vitamin D    deficiency: an Endocrine Society clinical practice    guideline. JCEM. 2011 Jul; 96(7):1911-30.          Passed - Valid encounter within last 12 months    Recent Outpatient Visits           2 months ago Encounter for general adult medical examination with abnormal findings   [place] [facility] [place], Salvadore Oxford, NP   9 months ago Mixed hyperlipidemia   [place] [facility] [place], Salvadore Oxford, NP   1 year ago Screening for cervical cancer   [place] [facility] [place], Salvadore Oxford, NP   1 year ago Encounter for general adult medical examination with abnormal findings   [place] [facility] [place], Salvadore Oxford, NP   1 year ago Bilateral chronic knee pain   [place] [facility] [place], Salvadore Oxford, NP        Future Appointments             In 3 months Baity, Salvadore Oxford, NP [place] [facility], [facility]

## 2023-11-14 NOTE — Telephone Encounter (Signed)
 Requested medications are due for refill today.  yes  Requested medications are on the active medications list.  yes  Last refill. 02/22/2023 #12 1 rf  Future visit scheduled.   yes  Notes to clinic.  Provider to review at this dosage.    Requested Prescriptions  Pending Prescriptions Disp Refills   Vitamin D, Ergocalciferol, (DRISDOL) 1.25 MG (50000 UNIT) CAPS capsule [Pharmacy Med Name: Vitamin D (Ergocalciferol) 1.25 MG (50000 UT) Oral Capsule] 13 capsule 3    Sig: TAKE 1 CAPSULE BY MOUTH WEEKLY  FOR 12 WEEKS. THEN START OTC  VITAMIN D3: 2,000 UNITS DAILY     Endocrinology:  Vitamins - Vitamin D Supplementation 2 Failed - 11/14/2023  8:40 AM      Failed - Manual Review: Route requests for 50,000 IU strength to the provider      Failed - Ca in normal range and within 360 days    Calcium  Date Value Ref Range Status  08/31/2023 8.6 (L) 8.7 - 10.2 mg/dL Final   Calcium, Total  Date Value Ref Range Status  07/03/2013 9.0 8.5 - 10.1 mg/dL Final         Failed - Vitamin D in normal range and within 360 days    Vit D, 25-Hydroxy  Date Value Ref Range Status  08/31/2023 25.7 (L) 30.0 - 100.0 ng/mL Final    Comment:    Vitamin D deficiency has been defined by the Institute of Medicine and an Endocrine Society practice guideline as a level of serum 25-OH vitamin D less than 20 ng/mL (1,2). The Endocrine Society went on to further define vitamin D insufficiency as a level between 21 and 29 ng/mL (2). 1. IOM (Institute of Medicine). 2010. Dietary reference    intakes for calcium and D. [place]: The    [employer]. 2. Holick MF, Binkley [place], Bischoff-Ferrari HA, et al.    Evaluation, treatment, and prevention of vitamin D    deficiency: an Endocrine Society clinical practice    guideline. JCEM. 2011 Jul; 96(7):1911-30.          Passed - Valid encounter within last 12 months    Recent Outpatient Visits           2 months ago Encounter for general adult  medical examination with abnormal findings   [place] [facility] [place], Salvadore Oxford, NP   9 months ago Mixed hyperlipidemia   [place] [facility] [place], Salvadore Oxford, NP   1 year ago Screening for cervical cancer   [place] [facility] [place], Salvadore Oxford, NP   1 year ago Encounter for general adult medical examination with abnormal findings   [place] [facility] [place], Salvadore Oxford, NP   1 year ago Bilateral chronic knee pain   [place] [facility] [place], Salvadore Oxford, NP       Future Appointments             In 3 months [place], Salvadore Oxford, NP [place] [facility], [place]            Signed Prescriptions Disp Refills   losartan (COZAAR) 100 MG tablet 90 tablet 1    Sig: TAKE 1 TABLET BY MOUTH DAILY     Cardiovascular:  Angiotensin Receptor Blockers Failed - 11/14/2023  8:40 AM      Failed - Cr in normal range and within 180 days    Creatinine  Date Value Ref Range Status  07/03/2013 0.85 0.60 - 1.30 mg/dL Final   Creatinine, Ser  Date Value Ref Range Status  08/31/2023 1.38 (H) 0.57 - 1.00 mg/dL Final         Passed - K in normal range and within 180 days    Potassium  Date Value Ref Range Status  08/31/2023 4.0 3.5 - 5.2 mmol/L Final  07/03/2013 3.6 3.5 - 5.1 mmol/L Final         Passed - Patient is not pregnant      Passed - Last BP in normal range    BP Readings from Last 1 Encounters:  08/29/23 138/74         Passed - Valid encounter within last 6 months    Recent Outpatient Visits           2 months ago Encounter for general adult medical examination with abnormal findings   [place] [facility] [place], Salvadore Oxford, NP   9 months ago Mixed hyperlipidemia   [place] [facility] [place], Salvadore Oxford, NP   1 year ago Screening for cervical cancer   [place] [facility] [place], Salvadore Oxford, NP   1 year ago Encounter for  general adult medical examination with abnormal findings   [place] [facility] [place], Salvadore Oxford, NP   1 year ago Bilateral chronic knee pain   [place] [facility] [place], Salvadore Oxford, NP       Future Appointments             In 3 months Baity, Salvadore Oxford, NP [place], [facility]

## 2023-11-29 ENCOUNTER — Encounter: Payer: Self-pay | Admitting: Internal Medicine

## 2023-11-29 ENCOUNTER — Ambulatory Visit: Admitting: Internal Medicine

## 2023-11-29 VITALS — BP 175/97 | Ht 65.0 in | Wt 340.5 lb

## 2023-11-29 DIAGNOSIS — I10 Essential (primary) hypertension: Secondary | ICD-10-CM

## 2023-11-29 DIAGNOSIS — R519 Headache, unspecified: Secondary | ICD-10-CM

## 2023-11-29 DIAGNOSIS — N1831 Chronic kidney disease, stage 3a: Secondary | ICD-10-CM

## 2023-11-29 NOTE — Progress Notes (Signed)
 Subjective:    Patient ID: Margaret Carlson, female    DOB: 05/25/1976, 48 y.o.   MRN: [id number]  HPI  Discussed the use of AI scribe software for clinical note transcription with the patient, who gave verbal consent to proceed.  Margaret Carlson is a 48 year old female with hypertension and chronic kidney disease who presents with elevated blood pressure and headache.  She has been experiencing elevated blood pressure readings, with recent measurements ranging from the 160s to 170s. She is currently on maximum doses of amlodipine 10 mg and losartan 100 mg. Previously, she was on losartan with HCTZ, but the diuretic was discontinued due to concerns about chronic kidney disease. She is due for blood work to assess kidney function.  She describes a headache that began on Monday as a low-grade sensation, which intensified last night when she lay down. She felt 'out of sorts' at work, with a heavy head and a sensation of being on autopilot, leading her to forget items like her watch and purse. No chest pain or shortness of breath.  Her appetite has decreased, and she eats primarily to maintain nutrition. She has been more active and is trying to eat right and increase her water intake.  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       Review of Systems     Past Medical History:  Diagnosis Date   Allergy    Anxiety    Hyperlipidemia    Hypertension     Current Outpatient Medications  Medication Sig Dispense Refill   acyclovir (ZOVIRAX) 400 MG tablet Take 1 tablet (400 mg total) by mouth 3 (three) times daily as needed (Take for 5 days for an outbreak). 90 tablet 0   amLODipine (NORVASC) 10 MG tablet Take 1 tablet (10 mg total) by mouth daily. 90 tablet 1   Calcium-Vitamin D-Vitamin K (CALCIUM + D + K PO) Take 1 capsule by mouth daily.     cyanocobalamin 2000 MCG tablet Take 2,000 mcg by mouth daily.      FLUoxetine (PROZAC) 10 MG tablet Take 1 tablet (10 mg total) by mouth daily. 90 tablet 1   losartan (COZAAR) 100 MG tablet TAKE 1 TABLET BY MOUTH DAILY 90 tablet 1   meloxicam (MOBIC) 15 MG tablet Take 1 tablet (15 mg total) by mouth daily. 90 tablet 0   Multiple Vitamin (MULTIVITAMIN) tablet Take 1 tablet by mouth daily.     Norethindrone Acetate-Ethinyl Estradiol (LARIN 1.5/30) 1.5-30 MG-MCG tablet TAKE 1 TABLET BY MOUTH DAILY FOR 3 WEEKS THEN OFF FOR 1 WEEK 90 tablet 0   omeprazole (PRILOSEC) 20 MG capsule TAKE 1 CAPSULE BY MOUTH DAILY 90 capsule 3   rosuvastatin (CRESTOR) 10 MG tablet TAKE 1 TABLET BY MOUTH DAILY 90 tablet 1   Vitamin D, Ergocalciferol, (DRISDOL) 1.25 MG (50000 UNIT) CAPS capsule TAKE 1 CAPSULE BY MOUTH WEEKLY  FOR 12 WEEKS. THEN START OTC  VITAMIN D3: 2,000 UNITS DAILY 12 capsule 1   No current facility-administered medications for this visit.    Allergies  Allergen Reactions   Ranitidine Swelling    Face and lip swelling    Family History  Problem Relation Age of Onset   Arthritis Maternal Grandmother    Hypertension Maternal Grandmother    Breast cancer Maternal Grandmother 74   Colon cancer Neg Hx    Colon polyps Neg Hx  Rectal cancer Neg Hx    Stomach cancer Neg Hx    Esophageal cancer Neg Hx     Social History   Socioeconomic History   Marital status: Single    Spouse name: Not on file   Number of children: Not on file   Years of education: Not on file   Highest education level: Bachelor's degree (e.g., BA, AB, BS)  Occupational History   Not on file  Tobacco Use   Smoking status: Never   Smokeless tobacco: Never  Vaping Use   Vaping status: Never Used  Substance and Sexual Activity   Alcohol use: Yes    Alcohol/week: 0.0 standard drinks of alcohol    Comment: occasional   Drug use: No   Sexual activity: Yes    Partners: Male    Birth control/protection: Pill  Other Topics Concern   Not on file  Social History Narrative   Not on  file   Social Drivers of Health   Financial Resource Strain: Low Risk  (02/07/2023)   Overall Financial Resource Strain (CARDIA)    Difficulty of Paying Living Expenses: Not hard at all  Food Insecurity: No Food Insecurity (02/07/2023)   Hunger Vital Sign    Worried About Running Out of Food in the Last Year: Never true    Ran Out of Food in the Last Year: Never true  Transportation Needs: No Transportation Needs (02/07/2023)   PRAPARE - Administrator, Civil Service (Medical): No    Lack of Transportation (Non-Medical): No  Physical Activity: Insufficiently Active (02/07/2023)   Exercise Vital Sign    Days of Exercise per Week: 1 day    Minutes of Exercise per Session: 10 min  Stress: No Stress Concern Present (02/07/2023)   [employer] of Occupational Health - Occupational Stress Questionnaire    Feeling of Stress : Not at all  Social Connections: Moderately Integrated (02/07/2023)   Social Connection and Isolation Panel [NHANES]    Frequency of Communication with Friends and Family: More than three times a week    Frequency of Social Gatherings with Friends and Family: Once a week    Attends Religious Services: More than 4 times per year    Active Member of [employer] or Organizations: Yes    Attends Banker Meetings: 1 to 4 times per year    Marital Status: Never married  Intimate Partner Violence: Not on file     Constitutional: Pt reports headache, malaise. Denies fever, fatigue, or abrupt weight changes.  HEENT: Denies eye pain, eye redness, ear pain, ringing in the ears, wax buildup, runny nose, nasal congestion, bloody nose, or sore throat. Respiratory: Denies difficulty breathing, shortness of breath, cough or sputum production.   Cardiovascular: Pt reports swelling in legs. Denies chest pain, chest tightness, palpitations or swelling in the hands.  Gastrointestinal: Denies abdominal pain, bloating, constipation, diarrhea or blood in the stool.  GU:  Denies urgency, frequency, pain with urination, burning sensation, blood in urine, odor or discharge. Musculoskeletal: Patient reports knee pain.  Denies decrease in range of motion, difficulty with gait, muscle pain or joint swelling.  Skin: Denies redness, rashes, lesions or ulcercations.  Neurological: Patient reports insomnia, paresthesia of feet.  Denies dizziness, difficulty with memory, difficulty with speech or problems with balance and coordination.  Psych: Patient has a history of anxiety and depression.  Denies SI/HI.  No other specific complaints in a complete review of systems (except as listed in HPI above).  Objective:  Physical Exam  BP (!) 175/97 (BP Location: Left Wrist, Patient Position: Sitting, Cuff Size: Normal)   Ht 5\' 5"  (1.651 m)   Wt (!) 340 lb 8 oz (154.4 kg)   BMI 56.66 kg/m     Wt Readings from Last 3 Encounters:  08/29/23 (!) 351 lb (159.2 kg)  02/08/23 (!) 345 lb (156.5 kg)  09/29/22 (!) 333 lb (151 kg)    General: Appears her stated age, obese in NAD. Skin: Warm, dry and intact.  HEENT: Head: normal shape and size; Eyes: sclera white, no icterus, conjunctiva pink, PERRLA and EOMs intact;  Cardiovascular: Normal rate and rhythm. S1,S2 noted.  No murmur, rubs or gallops noted. No JVD.  Trace pitting BLE edema.  Pulmonary/Chest: Normal effort and positive vesicular breath sounds. No respiratory distress. No wheezes, rales or ronchi noted.  Musculoskeletal: No difficulty with gait.  Neurological: Alert and oriented.  Coordination normal.    BMET    Component Value Date/Time   NA 140 08/31/2023 1352   NA 137 07/03/2013 2132   K 4.0 08/31/2023 1352   K 3.6 07/03/2013 2132   CL 105 08/31/2023 1352   CL 103 07/03/2013 2132   CO2 21 08/31/2023 1352   CO2 29 07/03/2013 2132   GLUCOSE 66 (L) 08/31/2023 1352   GLUCOSE 118 (H) 06/18/2018 1756   GLUCOSE 90 07/03/2013 2132   BUN 14 08/31/2023 1352   BUN 9 07/03/2013 2132   CREATININE 1.38 (H)  08/31/2023 1352   CREATININE 0.85 07/03/2013 2132   CALCIUM 8.6 (L) 08/31/2023 1352   CALCIUM 9.0 07/03/2013 2132   GFRNONAA 71 08/24/2020 1626   GFRNONAA >60 07/03/2013 2132   GFRAA 82 08/24/2020 1626   GFRAA >60 07/03/2013 2132    Lipid Panel     Component Value Date/Time   CHOL 159 08/31/2023 1352   TRIG 59 08/31/2023 1352   HDL 47 08/31/2023 1352   CHOLHDL 3.4 08/31/2023 1352   LDLCALC 100 (H) 08/31/2023 1352    CBC    Component Value Date/Time   WBC 6.2 08/31/2023 1352   WBC 7.7 06/18/2018 1756   RBC 4.97 08/31/2023 1352   RBC 6.05 (H) 06/18/2018 1756   HGB 11.5 08/31/2023 1352   HCT 37.0 08/31/2023 1352   PLT 327 08/31/2023 1352   MCV 74 (L) 08/31/2023 1352   MCV 73 (L) 07/03/2013 2132   MCH 23.1 (L) 08/31/2023 1352   MCH 23.0 (L) 06/18/2018 1756   MCHC 31.1 (L) 08/31/2023 1352   MCHC 33.1 06/18/2018 1756   RDW 21.1 (H) 08/31/2023 1352   RDW 19.1 (H) 07/03/2013 2132   LYMPHSABS 2.5 03/17/2015 1618   EOSABS 0.1 03/17/2015 1618   BASOSABS 0.0 03/17/2015 1618    Hgb A1C Lab Results  Component Value Date   HGBA1C 5.6 08/31/2023           Assessment & Plan:   Assessment and Plan    Hypertension Hypertension with fluctuating readings, recently increasing. Symptoms may relate to elevated blood pressure. Current medications at maximum doses. Diuretic reintroduction depends on kidney function. - Continue amlodipine 10 mg and losartan 100 mg. - Order kidney function tests. - Consider reintroducing diuretic if kidney function is normal. - Discuss starting labetalol or hydralazine if kidney function is abnormal.  Chronic Kidney Disease Chronic kidney disease with previous diuretic-related exacerbation concerns. Reassessment needed for diuretic safety. - Order kidney function tests.    RTC in 2 months, follow-up chronic conditions Nicki Reaper, NP

## 2023-11-29 NOTE — Patient Instructions (Signed)
 How to Take Your Blood Pressure Blood pressure measures how strongly your blood is pressing against the walls of your arteries. Arteries are blood vessels that carry blood from your heart throughout your body. You can take your blood pressure at home with a machine. You may need to check your blood pressure at home: To check if you have high blood pressure (hypertension). To check your blood pressure over time. To make sure your blood pressure medicine is working. Supplies needed: Blood pressure machine, or monitor. A chair to sit in. This should be a chair where you can sit upright with your back supported. Do not sit on a soft couch or an armchair. Table or desk. Small notebook. Pencil or pen. How to prepare Avoid these things for 30 minutes before checking your blood pressure: Having drinks with caffeine in them, such as coffee or tea. Drinking alcohol. Eating. Smoking. Exercising. Do these things five minutes before checking your blood pressure: Go to the bathroom and pee (urinate). Sit in a chair. Be quiet. Do not talk. How to take your blood pressure Follow the instructions that came with your machine. If you have a digital blood pressure monitor, these may be the instructions: Sit up straight. Place your feet on the floor. Do not cross your ankles or legs. Rest your left arm at the level of your heart. You may rest it on a table, desk, or chair. Pull up your shirt sleeve. Wrap the blood pressure cuff around the upper part of your left arm. The cuff should be 1 inch (2.5 cm) above your elbow. It is best to wrap the cuff around bare skin. Fit the cuff snugly around your arm, but not too tightly. You should be able to place only one finger between the cuff and your arm. Place the cord so that it rests in the bend of your elbow. Press the power button. Sit quietly while the cuff fills with air and loses air. Write down the numbers on the screen. Wait 2-3 minutes and then repeat  steps 1-10. What do the numbers mean? Two numbers make up your blood pressure. The first number is called systolic pressure. The second is called diastolic pressure. An example of a blood pressure reading is "120 over 80" (or 120/80). If you are an adult and do not have a medical condition, use this guide to find out if your blood pressure is normal: Normal First number: below 120. Second number: below 80. Elevated First number: 120-129. Second number: below 80. Hypertension stage 1 First number: 130-139. Second number: 80-89. Hypertension stage 2 First number: 140 or above. Second number: 90 or above. Your blood pressure is above normal even if only the first or only the second number is above normal. Follow these instructions at home: Medicines Take over-the-counter and prescription medicines only as told by your doctor. Tell your doctor if your medicine is causing side effects. General instructions Check your blood pressure as often as your doctor tells you to. Check your blood pressure at the same time every day. Take your monitor to your next doctor's appointment. Your doctor will: Make sure you are using it correctly. Make sure it is working right. Understand what your blood pressure numbers should be. Keep all follow-up visits. General tips You will need a blood pressure machine or monitor. Your doctor can suggest a monitor. You can buy one at a drugstore or online. When choosing one: Choose one with an arm cuff. Choose one that wraps around your  upper arm. Only one finger should fit between your arm and the cuff. Do not choose one that measures your blood pressure from your wrist or finger. Where to find more information American Heart Association: www.heart.org Contact a doctor if: Your blood pressure keeps being high. Your blood pressure is suddenly low. Get help right away if: Your first blood pressure number is higher than 180. Your second blood pressure number is  higher than 120. These symptoms may be an emergency. Do not wait to see if the symptoms will go away. Get help right away. Call 911. Summary Check your blood pressure at the same time every day. Avoid caffeine, alcohol, smoking, and exercise for 30 minutes before checking your blood pressure. Make sure you understand what your blood pressure numbers should be. This information is not intended to replace advice given to you by your health care provider. Make sure you discuss any questions you have with your health care provider. Document Revised: 04/29/2021 Document Reviewed: 04/29/2021 Elsevier Patient Education  2024 [employer].

## 2023-11-30 ENCOUNTER — Other Ambulatory Visit: Payer: Self-pay | Admitting: Internal Medicine

## 2023-11-30 ENCOUNTER — Encounter: Payer: Self-pay | Admitting: Internal Medicine

## 2023-11-30 LAB — COMPREHENSIVE METABOLIC PANEL WITH GFR
ALT: 9 IU/L (ref 0–32)
AST: 13 IU/L (ref 0–40)
Albumin: 3.6 g/dL — ABNORMAL LOW (ref 3.9–4.9)
Alkaline Phosphatase: 81 IU/L (ref 44–121)
BUN/Creatinine Ratio: 14 (ref 9–23)
BUN: 15 mg/dL (ref 6–24)
Bilirubin Total: <0.2 mg/dL (ref 0.0–1.2)
CO2: 22 mmol/L (ref 20–29)
Calcium: 8.9 mg/dL (ref 8.7–10.2)
Chloride: 103 mmol/L (ref 96–106)
Creatinine, Ser: 1.04 mg/dL — ABNORMAL HIGH (ref 0.57–1.00)
Globulin, Total: 3.8 g/dL (ref 1.5–4.5)
Glucose: 86 mg/dL (ref 70–99)
Potassium: 4.4 mmol/L (ref 3.5–5.2)
Sodium: 140 mmol/L (ref 134–144)
Total Protein: 7.4 g/dL (ref 6.0–8.5)
eGFR: 67 mL/min/{1.73_m2} (ref >59)

## 2023-11-30 LAB — BASIC METABOLIC PANEL WITH GFR
BUN/Creatinine Ratio: 14 (ref 9–23)
BUN: 15 mg/dL (ref 6–24)
CO2: 24 mmol/L (ref 20–29)
Calcium: 8.8 mg/dL (ref 8.7–10.2)
Chloride: 103 mmol/L (ref 96–106)
Creatinine, Ser: 1.07 mg/dL — ABNORMAL HIGH (ref 0.57–1.00)
Glucose: 88 mg/dL (ref 70–99)
Potassium: 4.3 mmol/L (ref 3.5–5.2)
Sodium: 139 mmol/L (ref 134–144)
eGFR: 64 mL/min/{1.73_m2} (ref >59)

## 2023-12-01 ENCOUNTER — Other Ambulatory Visit: Payer: Self-pay | Admitting: Internal Medicine

## 2023-12-01 MED ORDER — HYDRALAZINE HCL 10 MG PO TABS: 10.0000 mg | ORAL_TABLET | Freq: Three times a day (TID) | ORAL | 0 refills | Status: DC

## 2023-12-01 NOTE — Telephone Encounter (Signed)
 Requested Prescriptions  Pending Prescriptions Disp Refills   rosuvastatin (CRESTOR) 10 MG tablet [Pharmacy Med Name: Rosuvastatin Calcium 10 MG Oral Tablet] 90 tablet 2    Sig: TAKE 1 TABLET BY MOUTH DAILY     Cardiovascular:  Antilipid - Statins 2 Failed - 12/01/2023 10:58 AM      Failed - Cr in normal range and within 360 days    Creatinine  Date Value Ref Range Status  07/03/2013 0.85 0.60 - 1.30 mg/dL Final   Creatinine, Ser  Date Value Ref Range Status  11/29/2023 1.04 (H) 0.57 - 1.00 mg/dL Final  16/05/9603 5.40 (H) 0.57 - 1.00 mg/dL Final         Failed - Valid encounter within last 12 months    Recent Outpatient Visits           2 days ago Primary hypertension   [facility], Salvadore Oxford, NP       Future Appointments             In 3 months [place], Salvadore Oxford, NP [facility]            Failed - Lipid Panel in normal range within the last 12 months    Cholesterol, Total  Date Value Ref Range Status  08/31/2023 159 100 - 199 mg/dL Final   LDL Chol Calc (NIH)  Date Value Ref Range Status  08/31/2023 100 (H) 0 - 99 mg/dL Final   HDL  Date Value Ref Range Status  08/31/2023 47 >39 mg/dL Final   Triglycerides  Date Value Ref Range Status  08/31/2023 59 0 - 149 mg/dL Final         Passed - Patient is not pregnant

## 2023-12-01 NOTE — Telephone Encounter (Signed)
 Requested medication (s) are due for refill today: No  Requested medication (s) are on the active medication list: Yes  Last refill:  12/01/23, #90, 0 refills   Future visit scheduled: Yes  Notes to clinic:  Pharmacy requesting 90 day supply unsure if change is appropriate     Requested Prescriptions  Pending Prescriptions Disp Refills   hydrALAZINE (APRESOLINE) 10 MG tablet [Pharmacy Med Name: HYDRALAZINE 10 MG TABLETS (ORANGE)] 270 tablet     Sig: TAKE 1 TABLET(10 MG) BY MOUTH THREE TIMES DAILY     Cardiovascular:  Vasodilators Failed - 12/01/2023  4:58 PM      Failed - ANA Screen, Ifa, Serum in normal range and within 360 days    No results found for: "ANA", "ANATITER", "LABANTI"       Failed - Last BP in normal range    BP Readings from Last 1 Encounters:  11/29/23 (!) 175/97         Failed - Valid encounter within last 12 months    Recent Outpatient Visits           2 days ago Primary hypertension   [facility] [place], Salvadore Oxford, NP       Future Appointments             In 3 months [place], Salvadore Oxford, NP [facility]            Passed - HCT in normal range and within 360 days    Hematocrit  Date Value Ref Range Status  08/31/2023 37.0 34.0 - 46.6 % Final         Passed - HGB in normal range and within 360 days    Hemoglobin  Date Value Ref Range Status  08/31/2023 11.5 11.1 - 15.9 g/dL Final         Passed - RBC in normal range and within 360 days    RBC  Date Value Ref Range Status  08/31/2023 4.97 3.77 - 5.28 x10E6/uL Final  06/18/2018 6.05 (H) 3.87 - 5.11 MIL/uL Final         Passed - WBC in normal range and within 360 days    WBC  Date Value Ref Range Status  08/31/2023 6.2 3.4 - 10.8 x10E3/uL Final  06/18/2018 7.7 4.0 - 10.5 K/uL Final         Passed - PLT in normal range and within 360 days    Platelets  Date Value Ref Range Status  08/31/2023 327 150 - 450 x10E3/uL Final

## 2024-01-11 ENCOUNTER — Other Ambulatory Visit: Payer: Self-pay | Admitting: Internal Medicine

## 2024-01-15 NOTE — Telephone Encounter (Signed)
 Requested Prescriptions  Pending Prescriptions Disp Refills   hydrALAZINE  (APRESOLINE ) 10 MG tablet [Pharmacy Med Name: HYDRALAZINE  10 MG TABLETS (ORANGE)] 270 tablet 0    Sig: TAKE 1 TABLET(10 MG) BY MOUTH THREE TIMES DAILY     Cardiovascular:  Vasodilators Failed - 01/15/2024  9:01 AM      Failed - ANA Screen, Ifa, Serum in normal range and within 360 days    No results found for: "ANA", "ANATITER", "LABANTI"       Failed - Last BP in normal range    BP Readings from Last 1 Encounters:  11/29/23 (!) 175/97         Failed - Valid encounter within last 12 months    Recent Outpatient Visits           1 month ago Primary hypertension   [facility] [place], Rankin Buzzard, NP       Future Appointments             In 1 month [place], Rankin Buzzard, NP [facility] [place]            Passed - HCT in normal range and within 360 days    Hematocrit  Date Value Ref Range Status  08/31/2023 37.0 34.0 - 46.6 % Final         Passed - HGB in normal range and within 360 days    Hemoglobin  Date Value Ref Range Status  08/31/2023 11.5 11.1 - 15.9 g/dL Final         Passed - RBC in normal range and within 360 days    RBC  Date Value Ref Range Status  08/31/2023 4.97 3.77 - 5.28 x10E6/uL Final  06/18/2018 6.05 (H) 3.87 - 5.11 MIL/uL Final         Passed - WBC in normal range and within 360 days    WBC  Date Value Ref Range Status  08/31/2023 6.2 3.4 - 10.8 x10E3/uL Final  06/18/2018 7.7 4.0 - 10.5 K/uL Final         Passed - PLT in normal range and within 360 days    Platelets  Date Value Ref Range Status  08/31/2023 327 150 - 450 x10E3/uL Final

## 2024-01-18 ENCOUNTER — Encounter: Payer: Self-pay | Admitting: Internal Medicine

## 2024-01-29 ENCOUNTER — Ambulatory Visit: Admitting: Internal Medicine

## 2024-01-29 ENCOUNTER — Encounter: Payer: Self-pay | Admitting: Internal Medicine

## 2024-01-29 VITALS — BP 128/80 | Ht 65.0 in | Wt 341.3 lb

## 2024-01-29 DIAGNOSIS — F5101 Primary insomnia: Secondary | ICD-10-CM

## 2024-01-29 DIAGNOSIS — M25562 Pain in left knee: Secondary | ICD-10-CM

## 2024-01-29 DIAGNOSIS — E782 Mixed hyperlipidemia: Secondary | ICD-10-CM

## 2024-01-29 DIAGNOSIS — F419 Anxiety disorder, unspecified: Secondary | ICD-10-CM | POA: Diagnosis not present

## 2024-01-29 DIAGNOSIS — A6004 Herpesviral vulvovaginitis: Secondary | ICD-10-CM

## 2024-01-29 DIAGNOSIS — I10 Essential (primary) hypertension: Secondary | ICD-10-CM | POA: Diagnosis not present

## 2024-01-29 DIAGNOSIS — K219 Gastro-esophageal reflux disease without esophagitis: Secondary | ICD-10-CM

## 2024-01-29 DIAGNOSIS — G8929 Other chronic pain: Secondary | ICD-10-CM

## 2024-01-29 DIAGNOSIS — M25561 Pain in right knee: Secondary | ICD-10-CM

## 2024-01-29 DIAGNOSIS — F32A Depression, unspecified: Secondary | ICD-10-CM

## 2024-01-29 NOTE — Patient Instructions (Signed)

## 2024-01-29 NOTE — Assessment & Plan Note (Signed)
Stable on her current dose of fluoxetine Support offered 

## 2024-01-29 NOTE — Assessment & Plan Note (Signed)
 She has acyclovir  to take if needed

## 2024-01-29 NOTE — Assessment & Plan Note (Signed)
 Not medicated Will monitor

## 2024-01-29 NOTE — Assessment & Plan Note (Signed)
 Encourage diet and exercise for weight loss

## 2024-01-29 NOTE — Assessment & Plan Note (Signed)
 Controlled on losartan , HCTZ or amlodipine  at this time Reinforced DASH diet and exercise for weight loss Kidney function reviewed

## 2024-01-29 NOTE — Assessment & Plan Note (Signed)
 Lipid profile reviewed Encouraged her to consume a low-fat diet Continue rosuvastatin

## 2024-01-29 NOTE — Assessment & Plan Note (Signed)
 Encourage weight loss as this can help reduce knee pain No longer taking meloxicam 

## 2024-01-29 NOTE — Progress Notes (Signed)
 Subjective:    Patient ID: Margaret Carlson, female    DOB: [birth year], 48 y.o.   MRN: [id number]  HPI  Patient presents to clinic today for 58-month follow-up of chronic conditions.  HTN: Her BP today is 128/90.  She is taking losartan , hydralazine  and amlodipine  as prescribed.  ECG from 02/2015 reviewed.  HLD: Her last LDL was 100, triglycerides 59, 08/2023.  She denies myalgias on rosuvastatin .  She tries to consume a low-fat diet.  Anxiety and depression: Chronic, managed on fluoxetine . She in unable to tolerate venlafaxine . She is currently seeing a therapist.  She denies SI/HI.  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  She no longer takes the meloxicam  as prescribed.  She follows with orthopedics.  Genital herpes: She denies recent outbreak.  She has acyclovir  to take as needed.  GERD: Triggered possibly by onions, broccoli.  She denies breakthrough on omeprazole .  There is no upper GI on file.  Insomnia: She has difficulty staying asleep.  She is not taking any medication for this.  There is no sleep study on file.    Review of Systems     Past Medical History:  Diagnosis Date   Allergy    Anxiety    Hyperlipidemia    Hypertension     Current Outpatient Medications  Medication Sig Dispense Refill   acyclovir  (ZOVIRAX ) 400 MG tablet Take 1 tablet (400 mg total) by mouth 3 (three) times daily as needed (Take for 5 days for an outbreak). 90 tablet 0   amLODipine  (NORVASC ) 10 MG tablet Take 1 tablet (10 mg total) by mouth daily. 90 tablet 1   Calcium -Vitamin D -Vitamin K (CALCIUM  + D + K PO) Take 1 capsule by mouth daily.     cyanocobalamin 2000 MCG tablet Take 2,000 mcg by mouth daily.     FLUoxetine  (PROZAC ) 10 MG tablet Take 1 tablet (10 mg total) by mouth daily. 90 tablet 1   hydrALAZINE  (APRESOLINE ) 10 MG tablet TAKE 1 TABLET(10 MG) BY MOUTH THREE TIMES DAILY 270 tablet 0   losartan  (COZAAR ) 100 MG tablet TAKE 1 TABLET BY MOUTH DAILY 90 tablet 1   meloxicam  (MOBIC ) 15 MG tablet Take  1 tablet (15 mg total) by mouth daily. (Patient not taking: Reported on 11/29/2023) 90 tablet 0   Multiple Vitamin (MULTIVITAMIN) tablet Take 1 tablet by mouth daily.     Norethindrone  Acetate-Ethinyl Estradiol (LARIN 1.5/30) 1.5-30 MG-MCG tablet TAKE 1 TABLET BY MOUTH DAILY FOR 3 WEEKS THEN OFF FOR 1 WEEK 90 tablet 0   omeprazole  (PRILOSEC) 20 MG capsule TAKE 1 CAPSULE BY MOUTH DAILY 90 capsule 3   rosuvastatin  (CRESTOR ) 10 MG tablet TAKE 1 TABLET BY MOUTH DAILY 90 tablet 2   Vitamin D , Ergocalciferol , (DRISDOL ) 1.25 MG (50000 UNIT) CAPS capsule TAKE 1 CAPSULE BY MOUTH WEEKLY  FOR 12 WEEKS. THEN START OTC  VITAMIN D3: 2,000 UNITS DAILY 12 capsule 1   No current facility-administered medications for this visit.    Allergies  Allergen Reactions   Ranitidine Swelling    Face and lip swelling    Family History  Problem Relation Age of Onset   Arthritis Maternal Grandmother    Hypertension Maternal Grandmother    Breast cancer Maternal Grandmother 43   Colon cancer Neg Hx    Colon polyps Neg Hx    Rectal cancer Neg Hx    Stomach cancer Neg Hx    Esophageal cancer Neg Hx     Social History   Socioeconomic History  Marital status: Single    Spouse name: Not on file   Number of children: Not on file   Years of education: Not on file   Highest education level: Bachelor's degree (e.g., BA, AB, BS)  Occupational History   Not on file  Tobacco Use   Smoking status: Never   Smokeless tobacco: Never  Vaping Use   Vaping status: Never Used  Substance and Sexual Activity   Alcohol use: Yes    Alcohol/week: 0.0 standard drinks of alcohol    Comment: occasional   Drug use: No   Sexual activity: Yes    Partners: Male    Birth control/protection: Pill  Other Topics Concern   Not on file  Social History Narrative   Not on file   Social Drivers of Health   Financial Resource Strain: Low Risk  (01/28/2024)   Overall Financial Resource Strain (CARDIA)    Difficulty of Paying Living  Expenses: Not hard at all  Food Insecurity: No Food Insecurity (01/28/2024)   Hunger Vital Sign    Worried About Running Out of Food in the Last Year: Never true    Ran Out of Food in the Last Year: Never true  Transportation Needs: No Transportation Needs (01/28/2024)   PRAPARE - Administrator, Civil Service (Medical): No    Lack of Transportation (Non-Medical): No  Physical Activity: Insufficiently Active (01/28/2024)   Exercise Vital Sign    Days of Exercise per Week: 2 days    Minutes of Exercise per Session: 20 min  Stress: No Stress Concern Present (01/28/2024)   [employer] of Occupational Health - Occupational Stress Questionnaire    Feeling of Stress : Not at all  Social Connections: Moderately Integrated (01/28/2024)   Social Connection and Isolation Panel [NHANES]    Frequency of Communication with Friends and Family: More than three times a week    Frequency of Social Gatherings with Friends and Family: Once a week    Attends Religious Services: More than 4 times per year    Active Member of [employer] or Organizations: Patient declined    Attends Banker Meetings: 1 to 4 times per year    Marital Status: Never married  Intimate Partner Violence: Not on file     Constitutional: Denies fever, malaise, fatigue, headache or abrupt weight changes.  HEENT: Denies eye pain, eye redness, ear pain, ringing in the ears, wax buildup, runny nose, nasal congestion, bloody nose, or sore throat. Respiratory: Denies difficulty breathing, shortness of breath, cough or sputum production.   Cardiovascular: Denies chest pain, chest tightness, palpitations or swelling in the hands or feet.  Gastrointestinal: Denies abdominal pain, bloating, constipation, diarrhea or blood in the stool.  GU: Denies urgency, frequency, pain with urination, burning sensation, blood in urine, odor or discharge. Musculoskeletal: Patient reports intermittent knee pain.  Denies decrease in range of  motion, difficulty with gait, muscle pain or joint swelling.  Skin: Denies redness, rashes, lesions or ulcercations.  Neurological: Patient reports insomnia.  Denies dizziness, difficulty with memory, difficulty with speech or problems with balance and coordination.  Psych: Patient has a history of anxiety and depression.  Denies SI/HI.  No other specific complaints in a complete review of systems (except as listed in HPI above).  Objective:   Physical Exam  BP 128/80   Ht 5\' 5"  (1.651 m)   Wt (!) 341 lb 5 oz (154.8 kg)   LMP 01/02/2024 (Approximate)   BMI 56.80 kg/m  Wt Readings from Last 3 Encounters:  11/29/23 (!) 340 lb 8 oz (154.4 kg)  08/29/23 (!) 351 lb (159.2 kg)  02/08/23 (!) 345 lb (156.5 kg)    General: Appears her stated age, obese, in NAD. Skin: Warm, dry and intact.  HEENT: Head: normal shape and size; Eyes: sclera white, no icterus, conjunctiva pink, PERRLA and EOMs intact;  Cardiovascular: Normal rate and rhythm. S1,S2 noted.  No murmur, rubs or gallops noted. No JVD or BLE edema.  Pulmonary/Chest: Normal effort and positive vesicular breath sounds. No respiratory distress. No wheezes, rales or ronchi noted.  Abdomen: Soft and nontender. Musculoskeletal: No difficulty with gait.  Neurological: Alert and oriented. Coordination normal.  Psychiatric: Mood and affect normal. Behavior is normal. Judgment and thought content normal.    BMET    Component Value Date/Time   NA 140 11/29/2023 1549   NA 139 11/29/2023 1549   NA 137 07/03/2013 2132   K 4.4 11/29/2023 1549   K 4.3 11/29/2023 1549   K 3.6 07/03/2013 2132   CL 103 11/29/2023 1549   CL 103 11/29/2023 1549   CL 103 07/03/2013 2132   CO2 22 11/29/2023 1549   CO2 24 11/29/2023 1549   CO2 29 07/03/2013 2132   GLUCOSE 86 11/29/2023 1549   GLUCOSE 88 11/29/2023 1549   GLUCOSE 118 (H) 06/18/2018 1756   GLUCOSE 90 07/03/2013 2132   BUN 15 11/29/2023 1549   BUN 15 11/29/2023 1549   BUN 9 07/03/2013  2132   CREATININE 1.04 (H) 11/29/2023 1549   CREATININE 1.07 (H) 11/29/2023 1549   CREATININE 0.85 07/03/2013 2132   CALCIUM  8.9 11/29/2023 1549   CALCIUM  8.8 11/29/2023 1549   CALCIUM  9.0 07/03/2013 2132   GFRNONAA 71 08/24/2020 1626   GFRNONAA >60 07/03/2013 2132   GFRAA 82 08/24/2020 1626   GFRAA >60 07/03/2013 2132    Lipid Panel     Component Value Date/Time   CHOL 159 08/31/2023 1352   TRIG 59 08/31/2023 1352   HDL 47 08/31/2023 1352   CHOLHDL 3.4 08/31/2023 1352   LDLCALC 100 (H) 08/31/2023 1352    CBC    Component Value Date/Time   WBC 6.2 08/31/2023 1352   WBC 7.7 06/18/2018 1756   RBC 4.97 08/31/2023 1352   RBC 6.05 (H) 06/18/2018 1756   HGB 11.5 08/31/2023 1352   HCT 37.0 08/31/2023 1352   PLT 327 08/31/2023 1352   MCV 74 (L) 08/31/2023 1352   MCV 73 (L) 07/03/2013 2132   MCH 23.1 (L) 08/31/2023 1352   MCH 23.0 (L) 06/18/2018 1756   MCHC 31.1 (L) 08/31/2023 1352   MCHC 33.1 06/18/2018 1756   RDW 21.1 (H) 08/31/2023 1352   RDW 19.1 (H) 07/03/2013 2132   LYMPHSABS 2.5 03/17/2015 1618   EOSABS 0.1 03/17/2015 1618   BASOSABS 0.0 03/17/2015 1618    Hgb A1C Lab Results  Component Value Date   HGBA1C 5.6 08/31/2023           Assessment & Plan:      RTC in 6 months for your annual exam Helayne Lo, NP

## 2024-01-29 NOTE — Assessment & Plan Note (Signed)
Avoid foods that trigger reflux Encourage weight loss as this can help reduce reflux symptoms Continue omeprazole 

## 2024-02-29 ENCOUNTER — Other Ambulatory Visit: Payer: Self-pay | Admitting: Internal Medicine

## 2024-03-01 ENCOUNTER — Other Ambulatory Visit: Payer: Self-pay | Admitting: Internal Medicine

## 2024-03-03 ENCOUNTER — Other Ambulatory Visit: Payer: Self-pay | Admitting: Internal Medicine

## 2024-03-04 NOTE — Telephone Encounter (Signed)
 Requested Prescriptions  Pending Prescriptions Disp Refills   LARIN 1.5/30 1.5-30 MG-MCG tablet [Pharmacy Med Name: LARIN 1.5/30 TAB 1.5MG -30MCG 21] 63 tablet 3    Sig: TAKE 1 TABLET BY MOUTH DAILY FOR 3 WEEKS THEN OFF FOR 1 WEEK     OB/GYN:  Contraceptives Passed - 03/04/2024  1:26 PM      Passed - Last BP in normal range    BP Readings from Last 1 Encounters:  01/29/24 128/80         Passed - Valid encounter within last 12 months    Recent Outpatient Visits           1 month ago Primary hypertension   [place] [facility] [place], Angeline ORN, NP   3 months ago Primary hypertension   [place] [facility] [place], Angeline ORN, [place]              Passed - Patient is not a smoker

## 2024-03-05 ENCOUNTER — Ambulatory Visit: Payer: Self-pay | Admitting: Internal Medicine

## 2024-03-05 NOTE — Telephone Encounter (Signed)
 OV 01/29/24 Requested Prescriptions  Pending Prescriptions Disp Refills   omeprazole  (PRILOSEC) 20 MG capsule [Pharmacy Med Name: Omeprazole  20 MG Oral Capsule Delayed Release] 90 capsule 3    Sig: TAKE 1 CAPSULE BY MOUTH DAILY     Gastroenterology: Proton Pump Inhibitors Passed - 03/05/2024  3:46 PM      Passed - Valid encounter within last 12 months    Recent Outpatient Visits           1 month ago Primary hypertension   [place] [facility] [place], Angeline ORN, NP   3 months ago Primary hypertension   [place] [facility] [place], Angeline ORN, [place]

## 2024-03-05 NOTE — Telephone Encounter (Signed)
 Rx 11/14/23 #12 1RF- although RF requires provider review- patient should not be out-has RF Requested Prescriptions  Pending Prescriptions Disp Refills   Vitamin D , Ergocalciferol , (DRISDOL ) 1.25 MG (50000 UNIT) CAPS capsule [Pharmacy Med Name: Vitamin D  (Ergocalciferol ) 1.25 MG (50000 UT) Oral Capsule] 13 capsule 3    Sig: TAKE 1 CAPSULE BY MOUTH WEEKLY  FOR 12 WEEKS. THEN START OTC  VITAMIN D3: 2,000 UNITS DAILY     Endocrinology:  Vitamins - Vitamin D  Supplementation 2 Failed - 03/05/2024 11:08 AM      Failed - Manual Review: Route requests for 50,000 IU strength to the provider      Failed - Vitamin D  in normal range and within 360 days    Vit D, 25-Hydroxy  Date Value Ref Range Status  08/31/2023 25.7 (L) 30.0 - 100.0 ng/mL Final    Comment:    Vitamin D  deficiency has been defined by the Institute of Medicine and an Endocrine Society practice guideline as a level of serum 25-OH vitamin D  less than 20 ng/mL (1,2). The Endocrine Society went on to further define vitamin D  insufficiency as a level between 21 and 29 ng/mL (2). 1. IOM (Institute of Medicine). 2010. Dietary reference    intakes for calcium  and D. [place]: The    [employer]. 2. Holick MF, Binkley [place], Bischoff-Ferrari HA, et al.    Evaluation, treatment, and prevention of vitamin D     deficiency: an Endocrine Society clinical practice    guideline. JCEM. 2011 Jul; 96(7):1911-30.          Passed - Ca in normal range and within 360 days    Calcium   Date Value Ref Range Status  11/29/2023 8.9 8.7 - 10.2 mg/dL Final  95/97/7974 8.8 8.7 - 10.2 mg/dL Final   Calcium , Total  Date Value Ref Range Status  07/03/2013 9.0 8.5 - 10.1 mg/dL Final         Passed - Valid encounter within last 12 months    Recent Outpatient Visits           1 month ago Primary hypertension   [place] [facility] [place], Angeline ORN, NP   3 months ago Primary hypertension   [place] [facility] [place], Angeline ORN, [place]

## 2024-05-02 ENCOUNTER — Encounter: Payer: Self-pay | Admitting: Internal Medicine

## 2024-05-04 ENCOUNTER — Other Ambulatory Visit: Payer: Self-pay | Admitting: Internal Medicine

## 2024-05-04 DIAGNOSIS — Z01419 Encounter for gynecological examination (general) (routine) without abnormal findings: Secondary | ICD-10-CM

## 2024-05-06 NOTE — Telephone Encounter (Signed)
 Requested Prescriptions  Pending Prescriptions Disp Refills   FLUoxetine  (PROZAC ) 10 MG tablet [Pharmacy Med Name: FLUOXETINE   10MG   TAB] 90 tablet 0    Sig: TAKE 1 TABLET BY MOUTH DAILY     Psychiatry:  Antidepressants - SSRI Passed - 05/06/2024 11:38 AM      Passed - Completed PHQ-2 or PHQ-9 in the last 360 days      Passed - Valid encounter within last 6 months    Recent Outpatient Visits           3 months ago Primary hypertension   [place] [facility] [place], Angeline ORN, NP   5 months ago Primary hypertension   [place] [facility] [place], Angeline ORN, NP               losartan  (COZAAR ) 100 MG tablet [Pharmacy Med Name: Losartan  Potassium 100 MG Oral Tablet] 90 tablet 0    Sig: TAKE 1 TABLET BY MOUTH DAILY     Cardiovascular:  Angiotensin Receptor Blockers Failed - 05/06/2024 11:38 AM      Failed - Cr in normal range and within 180 days    Creatinine  Date Value Ref Range Status  07/03/2013 0.85 0.60 - 1.30 mg/dL Final   Creatinine, Ser  Date Value Ref Range Status  11/29/2023 1.04 (H) 0.57 - 1.00 mg/dL Final  95/97/7974 8.92 (H) 0.57 - 1.00 mg/dL Final         Passed - K in normal range and within 180 days    Potassium  Date Value Ref Range Status  11/29/2023 4.4 3.5 - 5.2 mmol/L Final  11/29/2023 4.3 3.5 - 5.2 mmol/L Final  07/03/2013 3.6 3.5 - 5.1 mmol/L Final         Passed - Patient is not pregnant      Passed - Last BP in normal range    BP Readings from Last 1 Encounters:  01/29/24 128/80         Passed - Valid encounter within last 6 months    Recent Outpatient Visits           3 months ago Primary hypertension   [place] [facility] [place], Angeline ORN, NP   5 months ago Primary hypertension   [place] [facility] [place], [place] W, NP               amLODipine  (NORVASC ) 10 MG tablet [Pharmacy Med Name: amLODIPine  Besylate 10 MG Oral Tablet] 90 tablet 0    Sig: TAKE 1 TABLET  BY MOUTH DAILY     Cardiovascular: Calcium  Channel Blockers 2 Passed - 05/06/2024 11:38 AM      Passed - Last BP in normal range    BP Readings from Last 1 Encounters:  01/29/24 128/80         Passed - Last Heart Rate in normal range    Pulse Readings from Last 1 Encounters:  02/08/23 79         Passed - Valid encounter within last 6 months    Recent Outpatient Visits           3 months ago Primary hypertension   [place] [facility] [place], Angeline ORN, NP   5 months ago Primary hypertension    [facility] [place], Angeline ORN, [place]

## 2024-05-28 ENCOUNTER — Other Ambulatory Visit: Payer: Self-pay | Admitting: Internal Medicine

## 2024-05-30 NOTE — Telephone Encounter (Signed)
 Requested medications are due for refill today.  yes  Requested medications are on the active medications list.  yes  Last refill. 01/15/2024 #270 0 rf  Future visit scheduled.   yes  Notes to clinic.  Missing labs    Requested Prescriptions  Pending Prescriptions Disp Refills   hydrALAZINE  (APRESOLINE ) 10 MG tablet [Pharmacy Med Name: HYDRALAZINE  10 MG TABLETS (ORANGE)] 270 tablet 0    Sig: TAKE 1 TABLET(10 MG) BY MOUTH THREE TIMES DAILY     Cardiovascular:  Vasodilators Failed - 05/30/2024  1:58 PM      Failed - ANA Screen, Ifa, Serum in normal range and within 360 days    No results found for: ANA, ANATITER, LABANTI       Passed - HCT in normal range and within 360 days    Hematocrit  Date Value Ref Range Status  08/31/2023 37.0 34.0 - 46.6 % Final         Passed - HGB in normal range and within 360 days    Hemoglobin  Date Value Ref Range Status  08/31/2023 11.5 11.1 - 15.9 g/dL Final         Passed - RBC in normal range and within 360 days    RBC  Date Value Ref Range Status  08/31/2023 4.97 3.77 - 5.28 x10E6/uL Final  06/18/2018 6.05 (H) 3.87 - 5.11 MIL/uL Final         Passed - WBC in normal range and within 360 days    WBC  Date Value Ref Range Status  08/31/2023 6.2 3.4 - 10.8 x10E3/uL Final  06/18/2018 7.7 4.0 - 10.5 K/uL Final         Passed - PLT in normal range and within 360 days    Platelets  Date Value Ref Range Status  08/31/2023 327 150 - 450 x10E3/uL Final         Passed - Last BP in normal range    BP Readings from Last 1 Encounters:  01/29/24 128/80         Passed - Valid encounter within last 12 months    Recent Outpatient Visits           4 months ago Primary hypertension   [place] [facility] [place], Angeline ORN, NP   6 months ago Primary hypertension   [place] [facility] [place], Angeline ORN, [place]

## 2024-06-21 ENCOUNTER — Other Ambulatory Visit: Payer: Self-pay | Admitting: Internal Medicine

## 2024-06-21 DIAGNOSIS — Z1231 Encounter for screening mammogram for malignant neoplasm of breast: Secondary | ICD-10-CM

## 2024-07-16 ENCOUNTER — Other Ambulatory Visit: Payer: Self-pay | Admitting: Internal Medicine

## 2024-07-16 DIAGNOSIS — Z01419 Encounter for gynecological examination (general) (routine) without abnormal findings: Secondary | ICD-10-CM

## 2024-07-19 NOTE — Telephone Encounter (Signed)
 Requested Prescriptions  Pending Prescriptions Disp Refills   omeprazole  (PRILOSEC) 20 MG capsule [Pharmacy Med Name: Omeprazole  20 MG Oral Capsule Delayed Release] 90 capsule 3    Sig: TAKE 1 CAPSULE BY MOUTH DAILY     Gastroenterology: Proton Pump Inhibitors Passed - 07/19/2024 11:42 AM      Passed - Valid encounter within last 12 months    Recent Outpatient Visits           5 months ago Primary hypertension   [place] [facility] [place], Angeline ORN, NP   7 months ago Primary hypertension   [place] [facility] [place], [place] W, NP               rosuvastatin  (CRESTOR ) 10 MG tablet [Pharmacy Med Name: Rosuvastatin  Calcium  10 MG Oral Tablet] 90 tablet 3    Sig: TAKE 1 TABLET BY MOUTH DAILY     Cardiovascular:  Antilipid - Statins 2 Failed - 07/19/2024 11:42 AM      Failed - Cr in normal range and within 360 days    Creatinine  Date Value Ref Range Status  07/03/2013 0.85 0.60 - 1.30 mg/dL Final   Creatinine, Ser  Date Value Ref Range Status  11/29/2023 1.04 (H) 0.57 - 1.00 mg/dL Final  95/97/7974 8.92 (H) 0.57 - 1.00 mg/dL Final         Failed - Lipid Panel in normal range within the last 12 months    Cholesterol, Total  Date Value Ref Range Status  08/31/2023 159 100 - 199 mg/dL Final   LDL Chol Calc (NIH)  Date Value Ref Range Status  08/31/2023 100 (H) 0 - 99 mg/dL Final   HDL  Date Value Ref Range Status  08/31/2023 47 >39 mg/dL Final   Triglycerides  Date Value Ref Range Status  08/31/2023 59 0 - 149 mg/dL Final         Passed - Patient is not pregnant      Passed - Valid encounter within last 12 months    Recent Outpatient Visits           5 months ago Primary hypertension   [place] [facility] [place], Angeline ORN, NP   7 months ago Primary hypertension   [place] [facility] [place], [place] W, NP               amLODipine  (NORVASC ) 10 MG tablet [Pharmacy Med Name:  amLODIPine  Besylate 10 MG Oral Tablet] 90 tablet 0    Sig: TAKE 1 TABLET BY MOUTH DAILY     Cardiovascular: Calcium  Channel Blockers 2 Passed - 07/19/2024 11:42 AM      Passed - Last BP in normal range    BP Readings from Last 1 Encounters:  01/29/24 128/80         Passed - Last Heart Rate in normal range    Pulse Readings from Last 1 Encounters:  02/08/23 79         Passed - Valid encounter within last 6 months    Recent Outpatient Visits           5 months ago Primary hypertension   [place] [facility] [place], Angeline ORN, NP   7 months ago Primary hypertension    [facility] [place], Angeline ORN, [place]

## 2024-07-30 ENCOUNTER — Ambulatory Visit
Admission: RE | Admit: 2024-07-30 | Discharge: 2024-07-30 | Disposition: A | Source: Ambulatory Visit | Attending: Internal Medicine | Admitting: Internal Medicine

## 2024-07-30 DIAGNOSIS — Z1231 Encounter for screening mammogram for malignant neoplasm of breast: Secondary | ICD-10-CM | POA: Diagnosis present

## 2024-08-09 ENCOUNTER — Other Ambulatory Visit: Payer: Self-pay

## 2024-08-09 ENCOUNTER — Encounter: Payer: Self-pay | Admitting: Internal Medicine

## 2024-09-03 ENCOUNTER — Encounter: Admitting: Internal Medicine

## 2024-09-19 ENCOUNTER — Encounter: Admitting: Internal Medicine

## 2024-09-24 ENCOUNTER — Encounter: Admitting: Internal Medicine

## 2024-10-24 ENCOUNTER — Encounter: Admitting: Internal Medicine
